# Patient Record
Sex: Female | Born: 1935 | Race: White | Hispanic: No | Marital: Single | Smoking: Never smoker
Health system: Southern US, Community
[De-identification: ages and names within clinical notes are randomized; demographics above are authoritative.]

## PROBLEM LIST (undated history)

## (undated) DIAGNOSIS — Z9989 Dependence on other enabling machines and devices: Secondary | ICD-10-CM

## (undated) DIAGNOSIS — K219 Gastro-esophageal reflux disease without esophagitis: Secondary | ICD-10-CM

## (undated) DIAGNOSIS — M545 Low back pain: Secondary | ICD-10-CM

## (undated) DIAGNOSIS — M1990 Unspecified osteoarthritis, unspecified site: Secondary | ICD-10-CM

## (undated) DIAGNOSIS — D649 Anemia, unspecified: Secondary | ICD-10-CM

## (undated) DIAGNOSIS — I351 Nonrheumatic aortic (valve) insufficiency: Secondary | ICD-10-CM

## (undated) DIAGNOSIS — G8929 Other chronic pain: Secondary | ICD-10-CM

## (undated) DIAGNOSIS — G4733 Obstructive sleep apnea (adult) (pediatric): Secondary | ICD-10-CM

## (undated) DIAGNOSIS — I10 Essential (primary) hypertension: Secondary | ICD-10-CM

## (undated) DIAGNOSIS — F329 Major depressive disorder, single episode, unspecified: Secondary | ICD-10-CM

## (undated) DIAGNOSIS — I639 Cerebral infarction, unspecified: Secondary | ICD-10-CM

## (undated) DIAGNOSIS — F32A Depression, unspecified: Secondary | ICD-10-CM

## (undated) DIAGNOSIS — R3915 Urgency of urination: Secondary | ICD-10-CM

## (undated) DIAGNOSIS — E785 Hyperlipidemia, unspecified: Secondary | ICD-10-CM

## (undated) HISTORY — DX: Nonrheumatic aortic (valve) insufficiency: I35.1

## (undated) HISTORY — DX: Essential (primary) hypertension: I10

## (undated) HISTORY — PX: DOPPLER ECHOCARDIOGRAPHY: SHX263

## (undated) HISTORY — PX: ULTRASOUND GUIDANCE FOR VASCULAR ACCESS: SHX6516

## (undated) HISTORY — PX: JOINT REPLACEMENT: SHX530

## (undated) HISTORY — PX: LAMINOTOMY / EXCISION DISK POSTERIOR CERVICAL SPINE: SUR749

## (undated) HISTORY — PX: CATARACT EXTRACTION, BILATERAL: SHX1313

## (undated) HISTORY — DX: Hyperlipidemia, unspecified: E78.5

## (undated) HISTORY — PX: TUMOR EXCISION: SHX421

## (undated) HISTORY — PX: TONSILLECTOMY: SUR1361

## (undated) HISTORY — PX: BACK SURGERY: SHX140

## (undated) HISTORY — PX: ABDOMINAL HYSTERECTOMY: SHX81

---

## 1998-07-22 ENCOUNTER — Other Ambulatory Visit: Admission: RE | Admit: 1998-07-22 | Discharge: 1998-07-22 | Payer: Self-pay | Admitting: Obstetrics and Gynecology

## 1999-07-28 ENCOUNTER — Other Ambulatory Visit: Admission: RE | Admit: 1999-07-28 | Discharge: 1999-07-28 | Payer: Self-pay | Admitting: Obstetrics and Gynecology

## 2000-08-02 ENCOUNTER — Other Ambulatory Visit: Admission: RE | Admit: 2000-08-02 | Discharge: 2000-08-02 | Payer: Self-pay | Admitting: Obstetrics and Gynecology

## 2000-10-13 ENCOUNTER — Encounter: Payer: Self-pay | Admitting: *Deleted

## 2000-10-13 ENCOUNTER — Ambulatory Visit (HOSPITAL_COMMUNITY): Admission: RE | Admit: 2000-10-13 | Discharge: 2000-10-13 | Payer: Self-pay | Admitting: *Deleted

## 2000-11-23 ENCOUNTER — Encounter: Admission: RE | Admit: 2000-11-23 | Discharge: 2000-11-23 | Payer: Self-pay | Admitting: Internal Medicine

## 2000-11-23 ENCOUNTER — Encounter: Payer: Self-pay | Admitting: Internal Medicine

## 2000-12-29 ENCOUNTER — Encounter: Admission: RE | Admit: 2000-12-29 | Discharge: 2000-12-29 | Payer: Self-pay | Admitting: Neurosurgery

## 2000-12-29 ENCOUNTER — Encounter: Payer: Self-pay | Admitting: Neurosurgery

## 2001-01-13 ENCOUNTER — Encounter: Payer: Self-pay | Admitting: Neurosurgery

## 2001-01-13 ENCOUNTER — Encounter: Admission: RE | Admit: 2001-01-13 | Discharge: 2001-01-13 | Payer: Self-pay | Admitting: Neurosurgery

## 2001-08-24 ENCOUNTER — Encounter: Admission: RE | Admit: 2001-08-24 | Discharge: 2001-08-24 | Payer: Self-pay | Admitting: Neurosurgery

## 2001-08-24 ENCOUNTER — Encounter: Payer: Self-pay | Admitting: Neurosurgery

## 2002-02-21 ENCOUNTER — Encounter: Payer: Self-pay | Admitting: Internal Medicine

## 2002-02-21 ENCOUNTER — Encounter: Admission: RE | Admit: 2002-02-21 | Discharge: 2002-02-21 | Payer: Self-pay | Admitting: Internal Medicine

## 2002-12-25 ENCOUNTER — Ambulatory Visit (HOSPITAL_COMMUNITY): Admission: RE | Admit: 2002-12-25 | Discharge: 2002-12-25 | Payer: Self-pay | Admitting: Family Medicine

## 2004-09-07 ENCOUNTER — Encounter: Admission: RE | Admit: 2004-09-07 | Discharge: 2004-09-07 | Payer: Self-pay | Admitting: Internal Medicine

## 2007-01-06 ENCOUNTER — Encounter: Admission: RE | Admit: 2007-01-06 | Discharge: 2007-01-06 | Payer: Self-pay | Admitting: Internal Medicine

## 2007-01-25 ENCOUNTER — Inpatient Hospital Stay (HOSPITAL_COMMUNITY): Admission: RE | Admit: 2007-01-25 | Discharge: 2007-01-27 | Payer: Self-pay | Admitting: Obstetrics and Gynecology

## 2007-01-25 ENCOUNTER — Encounter (INDEPENDENT_AMBULATORY_CARE_PROVIDER_SITE_OTHER): Payer: Self-pay | Admitting: Obstetrics and Gynecology

## 2007-08-20 ENCOUNTER — Emergency Department (HOSPITAL_COMMUNITY): Admission: EM | Admit: 2007-08-20 | Discharge: 2007-08-20 | Payer: Self-pay | Admitting: Emergency Medicine

## 2007-08-25 ENCOUNTER — Encounter: Admission: RE | Admit: 2007-08-25 | Discharge: 2007-08-25 | Payer: Self-pay | Admitting: Internal Medicine

## 2007-08-31 ENCOUNTER — Encounter: Admission: RE | Admit: 2007-08-31 | Discharge: 2007-08-31 | Payer: Self-pay | Admitting: Family Medicine

## 2007-09-15 ENCOUNTER — Encounter: Admission: RE | Admit: 2007-09-15 | Discharge: 2007-09-15 | Payer: Self-pay | Admitting: Family Medicine

## 2008-03-08 ENCOUNTER — Encounter: Admission: RE | Admit: 2008-03-08 | Discharge: 2008-03-08 | Payer: Self-pay | Admitting: Family Medicine

## 2008-05-31 ENCOUNTER — Encounter: Admission: RE | Admit: 2008-05-31 | Discharge: 2008-05-31 | Payer: Self-pay | Admitting: Family Medicine

## 2008-12-13 ENCOUNTER — Encounter: Admission: RE | Admit: 2008-12-13 | Discharge: 2008-12-13 | Payer: Self-pay | Admitting: Neurosurgery

## 2009-12-05 ENCOUNTER — Encounter: Admission: RE | Admit: 2009-12-05 | Discharge: 2009-12-05 | Payer: Self-pay | Admitting: Neurosurgery

## 2010-05-18 ENCOUNTER — Ambulatory Visit (AMBULATORY_SURGERY_CENTER): Payer: 59 | Admitting: *Deleted

## 2010-05-18 VITALS — Ht 59.0 in | Wt 129.7 lb

## 2010-05-18 DIAGNOSIS — Z1211 Encounter for screening for malignant neoplasm of colon: Secondary | ICD-10-CM

## 2010-05-18 MED ORDER — PEG-KCL-NACL-NASULF-NA ASC-C 100 G PO SOLR: ORAL | Status: DC

## 2010-05-18 NOTE — Progress Notes (Signed)
Pt had colonoscopy with Dr. Virginia Rochester 2002.  Unable to complete colon due to twisted colon.  Dr. Jarold Motto reviewed report.  Okay for colonoscopy with Propofol.    Propofol colon scheduled for 06/15/2010/ Ezra Sites

## 2010-05-19 ENCOUNTER — Encounter: Payer: Self-pay | Admitting: Gastroenterology

## 2010-05-26 NOTE — Op Note (Signed)
NAMEENNIS, DELPOZO                ACCOUNT NO.:  [id number]   MEDICAL RECORD NO.:  [id number]          PATIENT TYPE:  AMB   LOCATION:  SDC                           FACILITY:  [facility]   PHYSICIAN:  Carrington Clamp, M.D. DATE OF BIRTH:  1935-05-17   DATE OF PROCEDURE:  01/25/2007  DATE OF DISCHARGE:                               OPERATIVE REPORT   PREOPERATIVE DIAGNOSES:  1. Right adnexal cyst in a postmenopausal woman.  2. Thickened endometrium.  3. Aright lower quadrant pain.   POSTOPERATIVE DIAGNOSES:  1. Right adnexal cyst in a postmenopausal woman.  2. Thickened endometrium.  3. Aright lower quadrant pain.  4. Right cyst adenomata.   PROCEDURE:  Total abdominal hysterectomy with bilateral salpingo-  oophorectomy.   SURGEON:  Carrington Clamp, M.D.   ASSISTANT:  Miguel Aschoff, M.D.   ANESTHESIA:  General.   FINDINGS:  A very small uterus with a 10-cm right ovary that appeared to  be clear cyst.  There was a very small left ovary.  The bilateral  ureters were identified out of the field of dissection.  The liver edge  was normal.  The kidney were palpated normal.  The appendix appeared  normal.  There was no lymphadenopathy along the aorta.  Right fascial  hernia. Pathology was right ovary for frozen section which showed a  benign cystadenomata.  The uterus, cervix and left salpinx and ovary  sent for permanent.   ESTIMATED BLOOD LOSS:  200 mL.   IV FLUIDS:  1800 mL.   URINE OUTPUT:  375 mL.   COMPLICATIONS:  None.   MEDICATIONS:  None.   TECHNIQUE:  After adequate general anesthesia was achieved, the patient  was prepped and draped in usual sterile fashion in dorsal supine  position.  A Pfannenstiel skin incision was made with a scalpel and  carried down to the fascia with the Bovie cautery.  The fascia was  incised in midline with the scalpel.  At the level of the fascia, it was  noted that there was a thinning of the fascia near the abdominal  obliques on the  right-hand side.  The Mayo scissors were used to extend  the incision in a transverse curvilinear manner, and we deliberately  went a few centimeters lower than usual to go directly through this  hernia so that at the time of repair of the fascia we repaired the  hernia as well.  The fascia was dissected from the rectus muscles  superior and inferior with the Mayo scissors.  The rectus muscles were  split in the midline and the bowel free portion of peritoneum entered  into bluntly.  The peritoneum was then incised in a superior and  inferior manner with the good visualization of bowel and bladder.   The O'Connor-O'Sullivan retractor was placed, and the bowel was packed  away with wet laps.  The right ovary was identified and brought up  through the field of dissection.  Pelvic washings had been done just  prior to this and sent for cytology.  The right infundibulopelvic  ligament was clamped close to the  ovary with a Heaney clamp and then  incised with the Mayo scissors.  A stitch of 0 Vicryl was placed around  this followed by a freehand tie of 0 Vicryl.  The uterine ovarian  ligament and the rest of the mesosalpinx was then incorporated in  another bite with the Heaney clamp.  The ovary was then amputated with  the Metzenbaum scissors and sent to pathology for frozen section.  This  pedicle was secured with a stitch of 0 Vicryl.  At this point, attention  was then turned to the right round ligament.  The ligament was secured  with a suture ligature of 0 Vicryl and then divided with the Bovie  cautery.  The vesicouterine fascia was then incised with Bovie cautery  as well.  On the left-hand side, the round ligament was secured with  suture ligature and then incised with Bovie cautery.  The vesicouterine  fascia was retracted away from the cervix with both sharp dissection  with the Bovie cautery and sharp dissection with the Metzenbaum's.  The  posterior leaf of broad ligament was  entered into with the Bovie  cautery.  A clamp was placed over the infundibulopelvic ligament, and  this was incised and then secured with a freehand tie of 0 Vicryl  followed by stitch of 0 Vicryl.  An additional freehand tie was placed  after this to ensure hemostasis.  The ureters were identified well out  of the field of dissection.   The uterine arteries were skeletonized and grasped bilaterally with a  pair of Heaney clamps at the level of the internal os.  Each pedicle was  incised with the Mayo scissors and then secured with a stitch of 0  Vicryl.  A straight Heaney was placed on both sides, and each pedicle  was incised with a scalpel and then secured with a stitch of 0 Vicryl.  At the level of the reflection of the vagina onto the cervix, a pair of  curved Heaney's were replaced along the cervix, and each pedicle was  incised with the Mayo scissors and secured with a Heaney stitch of 0  Vicryl.  The uterine specimen was then amputated with Mayo scissors and  the vagina closed with two figure-of-eight stitches of 0 Vicryl.  Irrigation was performed, and the ureters were again identified  peristalsing out of the field of dissection.   The pelvis was irrigated and all instruments withdrawn from the abdomen.  Peritoneum was closed with a running stitch of 2-0 Vicryl.  All  instruments were withdrawn once hemostasis had been achieved.  The  peritoneum was closed with running stitch of 2-0 Vicryl.  This  incorporated the rectus muscles in a separate layer.  The fascia was  closed with a running stitch of 0 PDS which incorporated the prior  fascial defect, thus essentially closing the hernia on the right-hand  side.  The peritoneum was then closed with interrupted stitches of 2-0  plain gut.  The subcutaneous tissue had been rendered hemostatic with  Bovie cautery and irrigation.  The skin was closed with staples.  The  patient tolerated the procedure well and was turned to recovery  in  stable condition.      Carrington Clamp, M.D.  Electronically Signed     MH/MEDQ  D:  01/25/2007  T:  01/25/2007  Job:  [id number]

## 2010-05-29 NOTE — Procedures (Signed)
[facility]  Patient:    Julia Mcconnell, Julia Mcconnell Visit Number: [id number] MRN: [id number]          Service Type: END Location: ENDO Attending Physician:  Sabino Gasser Proc. Date: 10/13/00 Admit Date:  10/13/2000                             Procedure Report  PROCEDURE:  Colonoscopy.  INDICATIONS:  Rectal bleeding.  ANESTHESIA:  Demerol 60, Versed 4 mg.  DESCRIPTION OF PROCEDURE:  With the patient mildly sedated in the left lateral decubitus position and subsequently rolled to her back, the Olympus videoscopic colonoscope was inserted into the rectum and passed under direct vision to an area where the colon was twisted.  The endoscope would not get past this, so it was withdrawn, and the pediatric colonoscope was inserted to his distance.  It, too, could not get through.  Therefore, it was withdrawn. It appeared to be proximal to the splenic flexure.  The endoscope was then withdrawn, taking circumferential views of the remaining colonic mucosa, stopping the rectum to take a retroflex view of the anal canal.  The endoscope was straightened and withdrawn.  The patients vital signs and pulse oximeter remained stable.  The patient tolerated the procedure well without apparent complications.  FINDINGS:  Extrinsic twisted colon.  Will plan on getting barium enema to evaluate further. Attending Physician:  Sabino Gasser DD:  10/13/00 TD:  10/13/00 Job: [id number]

## 2010-05-29 NOTE — Discharge Summary (Signed)
NAMEELMIRA, Julia Mcconnell                ACCOUNT NO.:  [id number]   MEDICAL RECORD NO.:  [id number]          PATIENT TYPE:  INP   LOCATION:  9320                          FACILITY:  [facility]   PHYSICIAN:  Carrington Clamp, M.D. DATE OF BIRTH:  12-28-1935   DATE OF ADMISSION:  01/25/2007  DATE OF DISCHARGE:  01/27/2007                               DISCHARGE SUMMARY   ADMISSION DIAGNOSIS:  An 8-cm simple cyst without increased blood flow  in a 75 year old postmenopausal women.   DISCHARGE DIAGNOSIS:  An 8-cm simple cyst without increased blood flow  in a 75 year old postmenopausal women.   PERTINENT PROCEDURE PERFORMED:  Total abdominal hysterectomy with  bilateral salpingo-oophorectomy   PERTINENT TEST RESULTS:  Postoperative H&H of 11.8 and 34.9.   ADMISSION HISTORY AND PHYSICAL:  Please refer to the written history and  physical on chart.  Briefly, this is a 75 year old, G 0, postmenopausal  complaining of right lower quadrant pain in September and found to have  a possible palpable mass.  The patient was found to have a 7.9 cm simple  cyst on the right ovary without increased blood flow.  CA-125 was 13.6  and CA antigen was 1.5, both were normal.  CT confirmed no other  abnormalities.  After all risks, benefits, and alternatives had been  discussed with the patient, the patient elected to proceed with a simple  total abdominal hysterectomy with bilateral salpingo-oophorectomy.  The  endometrial lining had been found to be slightly thickened and cystic as  well on ultrasound.   HOSPITAL STAY:  The patient underwent the above named procedures without  complications.  On postoperative day #2, the patient was eating,  ambulating, and voiding without complication.  She did have some  bruising around her incision that was explained probably secondary to  the retractors.  The patient was discharged with the following.   DIET:  Increased water and fiber.   ACTIVITY:  Walk with assistance.   No driving for 2 weeks.  No lifting  for 6 weeks.  No sexual activity for 6 weeks.  May shower but no bath.   MEDICATIONS:  1. Percocet 5 mg 1 p.o. q.4-6 hours as needed for pain.  2. Colace 100 mg 1 p.o. b.i.d.   The patient is to continue her other current medications as per usual.  Return was Dr. Henderson Cloud on Monday or Tuesday for staple removal.      Carrington Clamp, M.D.  Electronically Signed     MH/MEDQ  D:  03/29/2007  T:  03/30/2007  Job:  [id number]

## 2010-06-01 ENCOUNTER — Other Ambulatory Visit: Payer: Self-pay | Admitting: Gastroenterology

## 2010-06-15 ENCOUNTER — Ambulatory Visit (AMBULATORY_SURGERY_CENTER): Payer: 59 | Admitting: Gastroenterology

## 2010-06-15 VITALS — BP 158/74 | HR 75 | Temp 97.2°F | Resp 18 | Ht 59.0 in | Wt 129.0 lb

## 2010-06-15 DIAGNOSIS — Z1211 Encounter for screening for malignant neoplasm of colon: Secondary | ICD-10-CM

## 2010-06-15 MED ORDER — SODIUM CHLORIDE 0.9 % IV SOLN: 500.0000 mL | INTRAVENOUS | Status: DC

## 2010-06-15 NOTE — Patient Instructions (Signed)
Your colonoscopy was normal.  You will need another colonoscopy in 10 years.  We will send you a reminder.   If you have any questions, please call [phone]. Thank-you.

## 2010-06-16 ENCOUNTER — Telehealth: Payer: Self-pay | Admitting: *Deleted

## 2010-06-16 NOTE — Telephone Encounter (Signed)

## 2010-10-01 LAB — CBC
Hemoglobin: 13.8
Hemoglobin: 11.8 — ABNORMAL LOW
MCHC: 34.2
MCV: 94.5
MCV: 94.8
Platelets: 192
RBC: 3.54 — ABNORMAL LOW
RDW: 14.3
WBC: 3.8 — ABNORMAL LOW
WBC: 6.2

## 2010-10-01 LAB — COMPREHENSIVE METABOLIC PANEL
ALT: 14
AST: 25
Albumin: 2.9 — ABNORMAL LOW
Alkaline Phosphatase: 48
BUN: 4 — ABNORMAL LOW
Chloride: 99
Creatinine, Ser: 0.63
GFR calc Af Amer: >60
GFR calc non Af Amer: >60
Glucose, Bld: 117 — ABNORMAL HIGH
Total Protein: 5.9 — ABNORMAL LOW

## 2010-10-01 LAB — BASIC METABOLIC PANEL
Creatinine, Ser: 0.73
Potassium: 3.9
Sodium: 136

## 2010-10-14 ENCOUNTER — Other Ambulatory Visit: Payer: Self-pay | Admitting: Family Medicine

## 2010-10-14 DIAGNOSIS — M25561 Pain in right knee: Secondary | ICD-10-CM

## 2010-10-16 ENCOUNTER — Ambulatory Visit
Admission: RE | Admit: 2010-10-16 | Discharge: 2010-10-16 | Disposition: A | Discharge status: Home / Self Care | Payer: 59 | Source: Ambulatory Visit | Attending: Family Medicine | Admitting: Family Medicine

## 2010-10-16 DIAGNOSIS — M25561 Pain in right knee: Secondary | ICD-10-CM

## 2011-09-28 ENCOUNTER — Other Ambulatory Visit: Payer: Self-pay | Admitting: Family Medicine

## 2011-09-28 DIAGNOSIS — M545 Low back pain: Secondary | ICD-10-CM

## 2011-10-01 ENCOUNTER — Ambulatory Visit
Admission: RE | Admit: 2011-10-01 | Discharge: 2011-10-01 | Disposition: A | Discharge status: Home / Self Care | Payer: 59 | Source: Ambulatory Visit | Attending: Family Medicine | Admitting: Family Medicine

## 2011-10-01 DIAGNOSIS — M545 Low back pain: Secondary | ICD-10-CM

## 2011-11-25 ENCOUNTER — Other Ambulatory Visit (HOSPITAL_COMMUNITY): Payer: Self-pay | Admitting: Cardiovascular Disease

## 2011-11-25 DIAGNOSIS — R0989 Other specified symptoms and signs involving the circulatory and respiratory systems: Secondary | ICD-10-CM

## 2011-12-03 ENCOUNTER — Ambulatory Visit (HOSPITAL_COMMUNITY)
Admission: RE | Admit: 2011-12-03 | Discharge: 2011-12-03 | Disposition: A | Discharge status: Home / Self Care | Payer: 59 | Source: Ambulatory Visit | Attending: Cardiovascular Disease | Admitting: Cardiovascular Disease

## 2011-12-03 DIAGNOSIS — R0989 Other specified symptoms and signs involving the circulatory and respiratory systems: Secondary | ICD-10-CM | POA: Insufficient documentation

## 2011-12-03 NOTE — Progress Notes (Signed)
Carotid Duplex Sonogram Completed. Lee-Ann Gal K 12/03/2011  

## 2012-04-03 ENCOUNTER — Other Ambulatory Visit: Payer: Self-pay

## 2012-04-03 ENCOUNTER — Other Ambulatory Visit: Payer: Self-pay | Admitting: Internal Medicine

## 2012-04-03 ENCOUNTER — Emergency Department (HOSPITAL_COMMUNITY)
Admission: EM | Admit: 2012-04-03 | Discharge: 2012-04-03 | Disposition: A | Discharge status: Home / Self Care | Payer: 59 | Attending: Emergency Medicine | Admitting: Emergency Medicine

## 2012-04-03 ENCOUNTER — Encounter (HOSPITAL_COMMUNITY): Payer: Self-pay | Admitting: Emergency Medicine

## 2012-04-03 ENCOUNTER — Emergency Department (HOSPITAL_COMMUNITY): Payer: 59

## 2012-04-03 DIAGNOSIS — M810 Age-related osteoporosis without current pathological fracture: Secondary | ICD-10-CM | POA: Insufficient documentation

## 2012-04-03 DIAGNOSIS — I639 Cerebral infarction, unspecified: Secondary | ICD-10-CM

## 2012-04-03 DIAGNOSIS — Z79899 Other long term (current) drug therapy: Secondary | ICD-10-CM | POA: Insufficient documentation

## 2012-04-03 DIAGNOSIS — R29810 Facial weakness: Secondary | ICD-10-CM

## 2012-04-03 DIAGNOSIS — I10 Essential (primary) hypertension: Secondary | ICD-10-CM | POA: Insufficient documentation

## 2012-04-03 DIAGNOSIS — Z9849 Cataract extraction status, unspecified eye: Secondary | ICD-10-CM | POA: Insufficient documentation

## 2012-04-03 DIAGNOSIS — R29818 Other symptoms and signs involving the nervous system: Secondary | ICD-10-CM | POA: Insufficient documentation

## 2012-04-03 LAB — POCT I-STAT, CHEM 8
BUN: 15 mg/dL (ref 6–23)
Chloride: 103 mEq/L (ref 96–112)
Creatinine, Ser: 1 mg/dL (ref 0.50–1.10)
Glucose, Bld: 98 mg/dL (ref 70–99)
Hemoglobin: 13.9 g/dL (ref 12.0–15.0)
Potassium: 3.4 mEq/L — ABNORMAL LOW (ref 3.5–5.1)
Sodium: 140 mEq/L (ref 135–145)

## 2012-04-03 LAB — CBC WITH DIFFERENTIAL/PLATELET
HCT: 39.8 % (ref 36.0–46.0)
MCHC: 34.9 g/dL (ref 30.0–36.0)
MCV: 93 fL (ref 78.0–100.0)
Monocytes Relative: 7 % (ref 3–12)
RDW: 13.5 % (ref 11.5–15.5)

## 2012-04-03 MED ORDER — CLOPIDOGREL BISULFATE 75 MG PO TABS
75.0000 mg | ORAL_TABLET | Freq: Once | ORAL | Status: AC
  Administered 2012-04-03: 75 mg via ORAL
  Filled 2012-04-03: qty 1

## 2012-04-03 MED ORDER — CLOPIDOGREL BISULFATE 75 MG PO TABS: 75.0000 mg | ORAL_TABLET | Freq: Every day | ORAL | Status: AC

## 2012-04-03 NOTE — ED Notes (Signed)
Pt states she woke up this am and her rt side of her face was swollen and tingling and her lip is drooping . Pt states  Her face was like this when she woke and it hasn't gone away no no other  Issues like  or slurred speech

## 2012-04-03 NOTE — ED Provider Notes (Signed)
History     CSN: [id number]  Arrival date & time 04/03/12  1237   First MD Initiated Contact with Patient 04/03/12 1350      No chief complaint on file.    HPI  The patient presents with right facial droop.  She awoke approximately 7 hours prior to my evaluation, noticed that the right lower portion of her face was more puffy and drooping. No pain, no visual changes, neck pain, no dyspnea, no dysphagia, no other focal complaints. She was in her usual state of lunch went to bed last night. She saw her primary care provider's assistant, was sent here for further evaluation.  Past Medical History  Diagnosis Date  . Hypertension   . Osteoporosis     Past Surgical History  Procedure Laterality Date  . Cataract extraction, bilateral    . Diskectomy neck    . Tumor right shoulder    . Abdominal hysterectomy      No family history on file.  History  Substance Use Topics  . Smoking status: Never Smoker   . Smokeless tobacco: Not on file  . Alcohol Use: No    OB History   Grav Para Term Preterm Abortions TAB SAB Ect Mult Living                  Review of Systems  Constitutional:       Per HPI, otherwise negative  HENT:       Per HPI, otherwise negative  Respiratory:       Per HPI, otherwise negative  Cardiovascular:       Per HPI, otherwise negative  Gastrointestinal: Negative for vomiting.  Endocrine:       Negative aside from HPI  Genitourinary:       Neg aside from HPI   Musculoskeletal:       Per HPI, otherwise negative  Skin: Negative.   Neurological: Negative for syncope.    Allergies  Codeine  Home Medications   Current Outpatient Rx  Name  Route  Sig  Dispense  Refill  . acetaminophen (TYLENOL) 500 MG tablet   Oral   Take 500 mg by mouth every 6 (six) hours as needed for pain.         Marland Kitchen alendronate (FOSAMAX) 70 MG tablet   Oral   Take 70 mg by mouth every 7 (seven) days. Sunday Take with a full glass of water on an empty stomach.        Marland Kitchen amLODipine (NORVASC) 5 MG tablet   Oral   Take 5 mg by mouth daily.           . calcium carbonate (OS-CAL) 600 MG TABS   Oral   Take 600 mg by mouth daily.         . carvedilol (COREG) 12.5 MG tablet   Oral   Take 6.25 mg by mouth 2 (two) times daily with a meal. Lunch and in the evening.         . cholecalciferol (VITAMIN D) 400 UNITS TABS   Oral   Take 400 Units by mouth.           Marland Kitchen lisinopril-hydrochlorothiazide (PRINZIDE,ZESTORETIC) 20-12.5 MG per tablet   Oral   Take 1 tablet by mouth daily.           . Multiple Vitamin (MULTIVITAMIN) tablet   Oral   Take 1 tablet by mouth daily.           . clopidogrel (  PLAVIX) 75 MG tablet   Oral   Take 1 tablet (75 mg total) by mouth daily.   15 tablet   0     BP 143/73  Pulse 94  Temp(Src) 98.4 F (36.9 C) (Oral)  Resp 14  Ht 4\' 11"  (1.499 m)  Wt 126 lb (57.153 kg)  BMI 25.44 kg/m2  SpO2 92%  Physical Exam  Nursing note and vitals reviewed. Constitutional: She is oriented to person, place, and time. She appears well-developed and well-nourished. No distress.  HENT:  Head: Normocephalic and atraumatic.  Eyes: Conjunctivae and EOM are normal.  Cardiovascular: Normal rate and regular rhythm.   Pulmonary/Chest: Effort normal and breath sounds normal. No stridor. No respiratory distress.  Abdominal: She exhibits no distension.  Musculoskeletal: She exhibits no edema.  Neurological: She is alert and oriented to person, place, and time. She has normal strength. A cranial nerve deficit is present. No sensory deficit. Coordination and gait normal.  Patient's R lower face is drooping. No smiling asym. Both eyelids elevate appropriately.   Skin: Skin is warm and dry.  Psychiatric: She has a normal mood and affect.    ED Course  Procedures (including critical care time)  Labs Reviewed  POCT I-STAT, CHEM 8 - Abnormal; Notable for the following:    Potassium 3.4 (*)    All other components within  normal limits  CBC WITH DIFFERENTIAL   Ct Head (brain) Wo Contrast  04/03/2012  *RADIOLOGY REPORT*  Clinical Data: Right side face and tingling and parasthesias. Facial droop.  CT HEAD WITHOUT CONTRAST  Technique:  Contiguous axial images were obtained from the base of the skull through the vertex without contrast.  Comparison: None.  Findings: There is no evidence for acute hemorrhage, hydrocephalus, mass lesion, or abnormal extra-axial fluid collection.  No definite CT evidence for acute infarction.  The visualized paranasal sinuses and mastoid air cells are clear.  IMPRESSION: No acute intracranial findings.   Original Report Authenticated By: Kennith Center, M.D.      1. Facial droop    Cardiac 80 sinus rhythm normal Pulse ox 100% room air normal    Date: 04/03/2012  Rate: 82  Rhythm: normal sinus rhythm  QRS Axis: left  Intervals: normal  ST/T Wave abnormalities: normal  Conduction Disutrbances:none  Narrative Interpretation:   Old EKG Reviewed: none available BORDERLINE   3:49 PM I discussed the patient's case with her primary care physician.  Given the patient's insistence on going home, we discussed if your treatment for stroke, with outpatient MRI, to which the patient is amenable.  After a lengthy conversation the patient refuses admission for further evaluation and management of possible stroke.  MDM  The patient presents with right facial droop.  Notably, the patient was in her usual state of health prior to the onset of symptoms, but has visible facial droop in the right lower half of her face.  Absent other neurologic complaints, palsy is a consideration, as is stroke, which was discussed at length with the patient.  After a lengthy discussion about the pros and cons, risks and benefits the patient refused admission for expeditious MRI, but was amenable to empiric treatment with Plavix, outpatient MRI, PMD followup.  This was arranged, after discussion with the patient's  primary care physician, and the patient was discharged in stable condition to     Gerhard Munch, MD 04/03/12 1551

## 2012-04-03 NOTE — ED Notes (Signed)
Pt woke up about 630 or 645 and went to see dr Renne Crigler before coming here

## 2012-04-04 ENCOUNTER — Other Ambulatory Visit: Payer: Self-pay | Admitting: Internal Medicine

## 2012-04-04 DIAGNOSIS — R200 Anesthesia of skin: Secondary | ICD-10-CM

## 2012-04-06 ENCOUNTER — Ambulatory Visit
Admission: RE | Admit: 2012-04-06 | Discharge: 2012-04-06 | Disposition: A | Discharge status: Home / Self Care | Payer: 59 | Source: Ambulatory Visit | Attending: Internal Medicine | Admitting: Internal Medicine

## 2012-04-06 DIAGNOSIS — I639 Cerebral infarction, unspecified: Secondary | ICD-10-CM

## 2012-04-06 DIAGNOSIS — R200 Anesthesia of skin: Secondary | ICD-10-CM

## 2012-04-13 ENCOUNTER — Encounter: Payer: Self-pay | Admitting: Neurology

## 2012-04-13 ENCOUNTER — Ambulatory Visit (INDEPENDENT_AMBULATORY_CARE_PROVIDER_SITE_OTHER): Payer: 59 | Admitting: Neurology

## 2012-04-13 VITALS — BP 148/75 | HR 81 | Ht <= 58 in | Wt 128.0 lb

## 2012-04-13 DIAGNOSIS — I10 Essential (primary) hypertension: Secondary | ICD-10-CM

## 2012-04-13 DIAGNOSIS — I872 Venous insufficiency (chronic) (peripheral): Secondary | ICD-10-CM | POA: Insufficient documentation

## 2012-04-13 DIAGNOSIS — I6350 Cerebral infarction due to unspecified occlusion or stenosis of unspecified cerebral artery: Secondary | ICD-10-CM

## 2012-04-13 NOTE — Progress Notes (Signed)
Julia Mcconnell is a 77 years old right-handed Caucasian female, referred by her primary care physician Dr. Virgina Norfolk, for evaluation of acute onset right facial numbness She has past medical history of hypertension, otherwise healthy, still working full-time at [employer], she lives with her 57 years old mother, who is also independent  In  March 24th, 2014, when she wakes up from sleep, she noticed right facial numbness, as if it was swollen, just came back from her dentist, she also has slight difficulty talking, symptoms last less than one day,she denied right side weakness, she denies right arm and neck numbness   she presented to her primary care physician, was referred to CAT scan of the brain at emergency room, showed no acute findings,   She later had MRI scan and [facility] imaging April 06 2012, which showed mild small vessel disease, no acute disease, Ultrasound of carotid arteries showed less than 50% stenosis at bilateral internal carotid artery  She was started on Plavix 75 mg every day, fasting lipid profile panel was pending,.  Her cardiologist Dr. Sherry Ruffing, "for leaking valve", she could not recall echocardiogram recently    Labs showed normal CBC, CMP,     Review of Systems  Out of a complete 14 system review, the patient complains of only the following symptoms, and all other reviewed systems are negative.   Constitutional:   N/A Cardiovascular:  Bilateral leg swelling. Ear/Nose/Throat:  N/A Skin: N/A Eyes: N/A Respiratory: N/A Gastroitestinal: N/A    Hematology/Lymphatic:  N/A Endocrine:  N/A Musculoskeletal:N/A Allergy/Immunology: N/A Neurological: N/A Psychiatric:    N/A  Assessment and plan: 77 year old female, with a vascular risk fact of aging, hypertension, presenting with transient right facial numbness, slight talking difficulty, suggestive of a left subcortical, vs. internal capsule small vessel disease. 1 continue Plavix. 2. Moderate  exercise, optimize blood pressure control less than 130 over 80 3 echocardiogram I will call her report 4 return to clinic as needed.

## 2012-09-01 ENCOUNTER — Telehealth (HOSPITAL_COMMUNITY): Payer: Self-pay | Admitting: Cardiovascular Disease

## 2012-09-06 ENCOUNTER — Other Ambulatory Visit (HOSPITAL_COMMUNITY): Payer: Self-pay | Admitting: Cardiovascular Disease

## 2012-09-06 DIAGNOSIS — I10 Essential (primary) hypertension: Secondary | ICD-10-CM

## 2012-09-18 ENCOUNTER — Ambulatory Visit (HOSPITAL_COMMUNITY)
Admission: RE | Admit: 2012-09-18 | Discharge: 2012-09-18 | Disposition: A | Discharge status: Home / Self Care | Payer: 59 | Source: Ambulatory Visit | Attending: Cardiology | Admitting: Cardiology

## 2012-09-18 DIAGNOSIS — I10 Essential (primary) hypertension: Secondary | ICD-10-CM

## 2012-09-18 NOTE — Progress Notes (Signed)
2D Echo Performed 09/18/2012    Sid Greener, RCS  

## 2012-09-19 ENCOUNTER — Telehealth: Payer: Self-pay | Admitting: *Deleted

## 2012-09-19 ENCOUNTER — Encounter: Payer: Self-pay | Admitting: *Deleted

## 2012-09-19 DIAGNOSIS — I351 Nonrheumatic aortic (valve) insufficiency: Secondary | ICD-10-CM

## 2012-09-19 NOTE — Telephone Encounter (Signed)
Message copied by Marella Bile on Tue Sep 19, 2012 10:23 PM ------      Message from: Runell Gess      Created: Tue Sep 19, 2012  4:50 PM       No change from prior study. Repeat in 12 months. ------

## 2012-09-19 NOTE — Telephone Encounter (Signed)
Order placed for repeat echo in 1 year 

## 2012-10-21 ENCOUNTER — Other Ambulatory Visit (HOSPITAL_COMMUNITY): Payer: Self-pay | Admitting: Cardiovascular Disease

## 2012-10-23 ENCOUNTER — Other Ambulatory Visit (HOSPITAL_COMMUNITY): Payer: Self-pay | Admitting: Cardiovascular Disease

## 2012-10-23 NOTE — Telephone Encounter (Signed)
Rx was sent to pharmacy electronically. 

## 2012-12-11 ENCOUNTER — Ambulatory Visit (INDEPENDENT_AMBULATORY_CARE_PROVIDER_SITE_OTHER): Payer: 59 | Admitting: Cardiology

## 2012-12-11 ENCOUNTER — Encounter: Payer: Self-pay | Admitting: Cardiology

## 2012-12-11 VITALS — BP 110/60 | HR 63 | Ht 59.0 in | Wt 128.0 lb

## 2012-12-11 DIAGNOSIS — E785 Hyperlipidemia, unspecified: Secondary | ICD-10-CM | POA: Insufficient documentation

## 2012-12-11 DIAGNOSIS — I10 Essential (primary) hypertension: Secondary | ICD-10-CM

## 2012-12-11 DIAGNOSIS — I351 Nonrheumatic aortic (valve) insufficiency: Secondary | ICD-10-CM

## 2012-12-11 DIAGNOSIS — I739 Peripheral vascular disease, unspecified: Secondary | ICD-10-CM

## 2012-12-11 DIAGNOSIS — I359 Nonrheumatic aortic valve disorder, unspecified: Secondary | ICD-10-CM

## 2012-12-11 DIAGNOSIS — G459 Transient cerebral ischemic attack, unspecified: Secondary | ICD-10-CM

## 2012-12-11 NOTE — Progress Notes (Signed)
12/11/2012 Julia Mcconnell   04/03/1935  [id number]  Primary Physicia Londell Moh, MD Primary Cardiologist: Dr Allyson Sabal  HPI:  Pleasant 77 y/o followed by Dr Allyson Sabal and Dr Renne Crigler with aortic regurgitation. She is asymptomatic from this at this time. Her last echo was Sept 2014. She did have a TIA in March 2014 and is on Plavix. Carotid dopplers showed < 50% ICA stenosis and MRI showed mild chronic microvascular disease. 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 She denies any unusual DOE or palpitations.  .    Current Outpatient Prescriptions  Medication Sig Dispense Refill  . alendronate (FOSAMAX) 70 MG tablet Take 70 mg by mouth every 7 (seven) days. Sunday Take with a full glass of water on an empty stomach.      Marland Kitchen amLODipine (NORVASC) 5 MG tablet Take 5 mg by mouth daily.       Marland Kitchen atorvastatin (LIPITOR) 20 MG tablet Take 20 mg by mouth daily.      . calcium carbonate (OS-CAL) 600 MG TABS Take 600 mg by mouth 2 (two) times daily.       . carvedilol (COREG) 12.5 MG tablet Take 0.5 tablets (6.25 mg total) by mouth 2 (two) times daily with a meal.  30 tablet  7  . cholecalciferol (VITAMIN D) 400 UNITS TABS Take 400 Units by mouth.        . clopidogrel (PLAVIX) 75 MG tablet Take 1 tablet (75 mg total) by mouth daily.  15 tablet  0  . lisinopril-hydrochlorothiazide (PRINZIDE,ZESTORETIC) 20-12.5 MG per tablet Take 1 tablet by mouth daily.        . Multiple Vitamin (MULTIVITAMIN) tablet Take 1 tablet by mouth daily.         No current facility-administered medications for this visit.    Allergies  Allergen Reactions  . Codeine Other (See Comments)    Makes me really hyper."    History   Social History  . Marital Status: Single    Spouse Name: N/A    Number of Children: o  . Years of Education: 12   Occupational History  .  [employer]   Social History Main Topics  . Smoking status:  Never Smoker   . Smokeless tobacco: Not on file  . Alcohol Use: No  . Drug Use: No  . Sexual Activity: Not on file   Other Topics Concern  . 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     Review of Systems: General: negative for chills, fever, night sweats or weight changes.  Cardiovascular: negative for chest pain, dyspnea on exertion, edema, orthopnea, palpitations, paroxysmal nocturnal dyspnea or shortness of breath Dermatological: negative for rash Respiratory: negative for cough or wheezing Urologic: negative for hematuria Abdominal: negative for nausea, vomiting, diarrhea, bright red blood per rectum, melena, or hematemesis Neurologic: negative for visual changes, syncope, or dizziness She has had problems with her Rt foot that sounds like plantar fascitis.  All other systems reviewed and are otherwise negative except as noted above.    Blood pressure 110/60, pulse 63, height 4\' 11"  (1.499 m), weight 128 lb (58.06 kg).  General appearance: alert, cooperative and no distress Neck: no carotid bruit and no JVD Lungs: clear to auscultation bilaterally Heart: regular rate and rhythm, soft systolic murmur, I could not appreciate any AR on exam.  EKG NSR, poor ant RW  ASSESSMENT AND PLAN:   Moderate aortic regurgitation Currently asymptomatic. Echo Sept 2014 shows moderate AR  Hypertension Controlled  Dyslipidemia Dr Renne Crigler follows  PVD (peripheral vascular disease)- < 50% bilat ICA 3/14 On Plavix  TIA (transient ischemic attack)- March 2014 Seen by Dr Jeannie Fend. [place] neurologist. Plan to continue Plavix.   PLAN  She will continue Plavix. She will follow up with Dr Allyson Sabal in 6 months. She knows to call us if she has any unusual DOE or palpitations.  [facility] KPA-C 12/11/2012 9:33 AM

## 2012-12-11 NOTE — Assessment & Plan Note (Signed)
On Plavix 

## 2012-12-11 NOTE — Assessment & Plan Note (Signed)
Currently asymptomatic. Echo Sept 2014 shows moderate AR

## 2012-12-11 NOTE — Patient Instructions (Signed)
Your physician recommends that you schedule a follow-up appointment in: 6 months with Dr Allyson Sabal Try rolling you foot over a frozen water bottle for your plantar fascitis.

## 2012-12-11 NOTE — Assessment & Plan Note (Signed)
Seen by Dr Jeannie Fend. [place] neurologist. Plan to continue Plavix.

## 2012-12-11 NOTE — Assessment & Plan Note (Signed)
Controlled.  

## 2012-12-11 NOTE — Assessment & Plan Note (Signed)
Dr Pharr follows 

## 2013-04-11 ENCOUNTER — Other Ambulatory Visit: Payer: Self-pay | Admitting: Obstetrics and Gynecology

## 2013-04-11 DIAGNOSIS — M8580 Other specified disorders of bone density and structure, unspecified site: Secondary | ICD-10-CM

## 2013-04-16 ENCOUNTER — Ambulatory Visit
Admission: RE | Admit: 2013-04-16 | Discharge: 2013-04-16 | Disposition: A | Discharge status: Home / Self Care | Payer: 59 | Source: Ambulatory Visit | Attending: Obstetrics and Gynecology | Admitting: Obstetrics and Gynecology

## 2013-04-16 ENCOUNTER — Encounter: Payer: Self-pay | Admitting: Gastroenterology

## 2013-04-16 DIAGNOSIS — M8580 Other specified disorders of bone density and structure, unspecified site: Secondary | ICD-10-CM

## 2013-05-28 ENCOUNTER — Ambulatory Visit (INDEPENDENT_AMBULATORY_CARE_PROVIDER_SITE_OTHER): Payer: 59 | Admitting: Cardiovascular Disease

## 2013-05-28 ENCOUNTER — Encounter: Payer: Self-pay | Admitting: Cardiovascular Disease

## 2013-05-28 VITALS — BP 138/64 | HR 79 | Ht <= 58 in | Wt 129.0 lb

## 2013-05-28 DIAGNOSIS — E785 Hyperlipidemia, unspecified: Secondary | ICD-10-CM

## 2013-05-28 DIAGNOSIS — I10 Essential (primary) hypertension: Secondary | ICD-10-CM

## 2013-05-28 DIAGNOSIS — I359 Nonrheumatic aortic valve disorder, unspecified: Secondary | ICD-10-CM

## 2013-05-28 DIAGNOSIS — I351 Nonrheumatic aortic (valve) insufficiency: Secondary | ICD-10-CM

## 2013-05-28 NOTE — Assessment & Plan Note (Signed)
Controlled on current medications 

## 2013-05-28 NOTE — Assessment & Plan Note (Signed)
We've been following her moderate aortic insufficiency by 2-D echocardiography her annual basis. This was last performed in September which showed normal LV systolic function, normal LV cavity size with moderate aortic insufficiency. She is completely asymptomatic. We'll continue to follow this on an annual basis.

## 2013-05-28 NOTE — Progress Notes (Signed)
05/28/2013 Julia Mcconnell   February 02, 1935  [id number]  Primary Physician Julia Pel, MD Primary Cardiologist: Julia Harp MD Julia Mcconnell   HPI:  The patient is a very pleasant 78 year old thin-appearing single Caucasian female with no children whom I last saw in the office 6 months ago. 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 Her problems include hypertension, hyperlipidemia, and moderate to severe aortic insufficiency which she is asymptomatic from. LV size and function have remained normal by 2D echo. Julia Mcconnell follows her laboratory exams including lipid profile.   Since I saw her last she has been without complaints except for an episode of what sounds like a TIA, which she was worked up for and which resolved spontaneously. Carotid Dopplers performed in our office December 03, 2011, were essentially normal. She saw Julia Mcconnell [facility] in the office back in December at that time she has done well and is currently stable    Current Outpatient Prescriptions  Medication Sig Dispense Refill  . alendronate (FOSAMAX) 70 MG tablet Take 70 mg by mouth every 7 (seven) days. Sunday Take with a full glass of water on an empty stomach.      Marland Kitchen amLODipine (NORVASC) 5 MG tablet Take 5 mg by mouth daily.       Marland Kitchen atorvastatin (LIPITOR) 20 MG tablet Take 20 mg by mouth daily.      . calcium carbonate (OS-CAL) 600 MG TABS Take 600 mg by mouth 2 (two) times daily.       . carvedilol (COREG) 12.5 MG tablet Take 0.5 tablets (6.25 mg total) by mouth 2 (two) times daily with a meal.  30 tablet  7  . cholecalciferol (VITAMIN D) 400 UNITS TABS Take 400 Units by mouth.        . clopidogrel (PLAVIX) 75 MG tablet Take 1 tablet (75 mg total) by mouth daily.  15 tablet  0  . lisinopril-hydrochlorothiazide (PRINZIDE,ZESTORETIC) 20-12.5 MG per tablet Take 1 tablet by mouth daily.        . Multiple Vitamin (MULTIVITAMIN) tablet Take 1 tablet by  mouth daily.         No current facility-administered medications for this visit.    Allergies  Allergen Reactions  . Codeine Other (See Comments)    Makes me really hyper."    History   Social History  . Marital Status: Single    Spouse Name: N/A    Number of Children: o  . Years of Education: 12   Occupational History  .  [employer]   Social History Main Topics  . Smoking status: Never Smoker   . Smokeless tobacco: Not on file  . Alcohol Use: No  . Drug Use: No  . Sexual Activity: Not on file   Other Topics Concern  . 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     Review of Systems: General: negative for chills, fever, night sweats or weight changes.  Cardiovascular: negative for chest pain, dyspnea on exertion, edema, orthopnea, palpitations, paroxysmal nocturnal dyspnea or shortness of breath Dermatological: negative for rash Respiratory: negative for cough or wheezing Urologic: negative for hematuria Abdominal: negative for nausea, vomiting, diarrhea, bright red blood per rectum, melena, or hematemesis Neurologic: negative for visual changes, syncope, or dizziness All other systems reviewed and are otherwise negative except as noted above.    Blood pressure 138/64, pulse  79, height 4\' 10"  (1.473 m), weight 129 lb (58.514 kg).  General appearance: alert and no distress Neck: no adenopathy, no carotid bruit, no JVD, supple, symmetrical, trachea midline and thyroid not enlarged, symmetric, no tenderness/mass/nodules Lungs: clear to auscultation bilaterally Heart: soft diastolic blow left sternal border Extremities: extremities normal, atraumatic, no cyanosis or edema  EKG normal sinus rhythm at 79 without ST or T wave changes  ASSESSMENT AND PLAN:   Dyslipidemia On statin therapy followed by her PCP  Hypertension Controlled on current medications  Moderate aortic regurgitation We've been following her  moderate aortic insufficiency by 2-D echocardiography her annual basis. This was last performed in September which showed normal LV systolic function, normal LV cavity size with moderate aortic insufficiency. She is completely asymptomatic. We'll continue to follow this on an annual basis.      Julia Harp MD FACP,FACC,FAHA, [facility] 05/28/2013 4:19 PM

## 2013-05-28 NOTE — Assessment & Plan Note (Signed)
On statin therapy followed by her PCP 

## 2013-05-28 NOTE — Patient Instructions (Signed)
We request that you follow-up in: 6 months with Luke Kilroy PA and in 1 year with Dr Berry  You will receive a reminder letter in the mail two months in advance. If you don't receive a letter, please call our office to schedule the follow-up appointment.   

## 2013-06-06 ENCOUNTER — Other Ambulatory Visit (INDEPENDENT_AMBULATORY_CARE_PROVIDER_SITE_OTHER): Payer: 59

## 2013-06-06 ENCOUNTER — Telehealth: Payer: Self-pay | Admitting: *Deleted

## 2013-06-06 ENCOUNTER — Ambulatory Visit (INDEPENDENT_AMBULATORY_CARE_PROVIDER_SITE_OTHER): Payer: 59 | Admitting: Gastroenterology

## 2013-06-06 ENCOUNTER — Encounter: Payer: Self-pay | Admitting: Gastroenterology

## 2013-06-06 VITALS — BP 120/50 | HR 68 | Ht <= 58 in | Wt 128.2 lb

## 2013-06-06 DIAGNOSIS — K921 Melena: Secondary | ICD-10-CM

## 2013-06-06 DIAGNOSIS — R195 Other fecal abnormalities: Secondary | ICD-10-CM

## 2013-06-06 LAB — CBC WITH DIFFERENTIAL/PLATELET
BASOS ABS: 0 10*3/uL (ref 0.0–0.1)
Basophils Relative: 0.9 % (ref 0.0–3.0)
Eosinophils Absolute: 0.1 10*3/uL (ref 0.0–0.7)
Eosinophils Relative: 1.9 % (ref 0.0–5.0)
HEMATOCRIT: 38.6 % (ref 36.0–46.0)
Hemoglobin: 12.7 g/dL (ref 12.0–15.0)
Lymphocytes Relative: 29.5 % (ref 12.0–46.0)
Lymphs Abs: 1.4 10*3/uL (ref 0.7–4.0)
MCHC: 32.9 g/dL (ref 30.0–36.0)
MCV: 96 fl (ref 78.0–100.0)
MONOS PCT: 10.8 % (ref 3.0–12.0)
Monocytes Absolute: 0.5 10*3/uL (ref 0.1–1.0)
NEUTROS PCT: 56.9 % (ref 43.0–77.0)
Neutro Abs: 2.7 10*3/uL (ref 1.4–7.7)
PLATELETS: 216 10*3/uL (ref 150.0–400.0)
RBC: 4.02 Mil/uL (ref 3.87–5.11)
RDW: 14.2 % (ref 11.5–15.5)
WBC: 4.8 10*3/uL (ref 4.0–10.5)

## 2013-06-06 NOTE — Assessment & Plan Note (Signed)
Patient is asymptomatic.  Last colonoscopy 2012 normal.  GI bleeding sources including AVMs, peptic ulcer disease, polyps or neoplasm are considerations.    Recommendations #1 upper endoscopy #2 CBC #3 if CBC is normal an endoscopy is negative I will repeat Hemoccults

## 2013-06-06 NOTE — Telephone Encounter (Signed)
  06/06/2013   RE: Julia Mcconnell DOB: 1935/12/26 MRN: [id number]   Dear  Thayer Jew Pharr,MD    We have scheduled the above patient for an endoscopic procedure. Our records show that she is on anticoagulation therapy.   Please advise as to how long the patient may come off her therapy of Plavix prior to the procedure, which is scheduled for 07/02/2013 .  Please fax back/ or route the completed form to [place] at [phone].   Sincerely,    Oda Kilts

## 2013-06-06 NOTE — Patient Instructions (Addendum)
Go to the basement for labs today  You will be contacted by our office prior to your procedure for directions on holding your Plavix.  If you do not hear from our office 1 week prior to your scheduled procedure, please call [phone] to discuss.   You have been scheduled for an endoscopy with propofol. Please follow written instructions given to you at your visit today. If you use inhalers (even only as needed), please bring them with you on the day of your procedure. Your physician has requested that you go to www.startemmi.com and enter the access code given to you at your visit today. This web site gives a general overview about your procedure. However, you should still follow specific instructions given to you by our office regarding your preparation for the procedure.

## 2013-06-06 NOTE — Progress Notes (Signed)
_                                                                                                                History of Present Illness: Pleasant 78 year old white female referred for evaluation of Hemoccult-positive stool.  This was noted on routine testing.  On occasion patient may see small amounts of blood on the toilet tissue.  This did not occur when she submitted stool samples.  Colonoscopy in 2012 was normal.  She denies change of bowel habits, abdominal or rectal pain.  She's on no gastric irritants including nonsteroidals.  Patient is on Plavix.    Past Medical History  Diagnosis Date  . Hypertension   . Osteoporosis   . Aortic insufficiency   . Hyperlipidemia    Past Surgical History  Procedure Laterality Date  . Cataract extraction, bilateral    . Diskectomy neck    . Tumor right shoulder    . Abdominal hysterectomy     family history includes Breast cancer in her maternal aunt; Diabetes in her maternal aunt and sister; Stroke (age of onset: 73) in her father; Throat cancer in her maternal grandfather. Current Outpatient Prescriptions  Medication Sig Dispense Refill  . alendronate (FOSAMAX) 70 MG tablet Take 70 mg by mouth every 7 (seven) days. Sunday Take with a full glass of water on an empty stomach.      Marland Kitchen atorvastatin (LIPITOR) 20 MG tablet Take 20 mg by mouth daily.      . calcium carbonate (OS-CAL) 600 MG TABS Take 600 mg by mouth 2 (two) times daily.       . carvedilol (COREG) 12.5 MG tablet Take 0.5 tablets (6.25 mg total) by mouth 2 (two) times daily with a meal.  30 tablet  7  . cholecalciferol (VITAMIN D) 400 UNITS TABS Take 400 Units by mouth.        . clopidogrel (PLAVIX) 75 MG tablet Take 1 tablet (75 mg total) by mouth daily.  15 tablet  0  . furosemide (LASIX) 20 MG tablet Take 20 mg by mouth daily.      Marland Kitchen gabapentin (NEURONTIN) 100 MG capsule Take 100 mg by mouth daily.      Marland Kitchen losartan (COZAAR) 50 MG tablet Take 50 mg by mouth  daily.      . Multiple Vitamin (MULTIVITAMIN) tablet Take 1 tablet by mouth daily.         No current facility-administered medications for this visit.   Allergies as of 06/06/2013 - Review Complete 06/06/2013  Allergen Reaction Noted  . Codeine Other (See Comments) 04/03/2012    reports that she has never smoked. She has never used smokeless tobacco. She reports that she does not drink alcohol or use illicit drugs.     Review of Systems: She complains of some lower back pain Pertinent positive and negative review of systems were noted in the above HPI section. All other review of systems were otherwise negative.  Vital signs were reviewed in today's medical record  Physical Exam: General: Well developed , well nourished, no acute distress Skin: anicteric Head: Normocephalic and atraumatic Eyes:  sclerae anicteric, EOMI Ears: Normal auditory acuity Mouth: No deformity or lesions Neck: Supple, no masses or thyromegaly Lungs: Clear throughout to auscultation Heart: Regular rate and rhythm; norubs or bruits Abdomen: Soft, non tender and non distended. No masses, hepatosplenomegaly or hernias noted. Normal Bowel sounds.  There is a 2/6 early systolic murmur heard best at the right second intercostal space Rectal:deferred Musculoskeletal: Symmetrical with no gross deformities  Skin: No lesions on visible extremities Pulses:  Normal pulses noted Extremities: No clubbing, cyanosis, edema or deformities noted Neurological: Alert oriented x 4, grossly nonfocal Cervical Nodes:  No significant cervical adenopathy Inguinal Nodes: No significant inguinal adenopathy Psychological:  Alert and cooperative. Normal mood and affect  See Assessment and Plan under Problem List

## 2013-06-16 ENCOUNTER — Other Ambulatory Visit (HOSPITAL_COMMUNITY): Payer: Self-pay | Admitting: Cardiovascular Disease

## 2013-06-18 NOTE — Telephone Encounter (Signed)
Rx was sent to pharmacy electronically. 

## 2013-06-22 ENCOUNTER — Encounter: Payer: Self-pay | Admitting: Gastroenterology

## 2013-06-25 NOTE — Telephone Encounter (Signed)
Have not heard baback from Dr Linnell Fulling office yet about plavix  Waiting on a nurse to call me back

## 2013-06-26 NOTE — Telephone Encounter (Signed)
Per Dr Linnell Fulling office------- OK TO HOLD PLAVIX 7 DAYS FOR PROCEDURE  PT AWARE

## 2013-07-02 ENCOUNTER — Other Ambulatory Visit: Payer: Self-pay | Admitting: *Deleted

## 2013-07-02 ENCOUNTER — Other Ambulatory Visit (INDEPENDENT_AMBULATORY_CARE_PROVIDER_SITE_OTHER): Payer: 59

## 2013-07-02 ENCOUNTER — Telehealth: Payer: Self-pay

## 2013-07-02 ENCOUNTER — Other Ambulatory Visit: Payer: Self-pay

## 2013-07-02 ENCOUNTER — Ambulatory Visit (AMBULATORY_SURGERY_CENTER): Payer: 59 | Admitting: Gastroenterology

## 2013-07-02 ENCOUNTER — Encounter: Payer: Self-pay | Admitting: Gastroenterology

## 2013-07-02 VITALS — BP 169/75 | HR 67 | Temp 97.1°F | Resp 15 | Ht <= 58 in | Wt 128.0 lb

## 2013-07-02 DIAGNOSIS — R195 Other fecal abnormalities: Secondary | ICD-10-CM

## 2013-07-02 DIAGNOSIS — K921 Melena: Secondary | ICD-10-CM

## 2013-07-02 DIAGNOSIS — K222 Esophageal obstruction: Secondary | ICD-10-CM

## 2013-07-02 DIAGNOSIS — R109 Unspecified abdominal pain: Secondary | ICD-10-CM

## 2013-07-02 LAB — BASIC METABOLIC PANEL
BUN: 10 mg/dL (ref 6–23)
CO2: 32 mEq/L (ref 19–32)
Calcium: 8.8 mg/dL (ref 8.4–10.5)
Chloride: 105 mEq/L (ref 96–112)
Creatinine, Ser: 0.6 mg/dL (ref 0.4–1.2)
GFR: 103.34 mL/min (ref >60.00)
Glucose, Bld: 103 mg/dL — ABNORMAL HIGH (ref 70–99)
POTASSIUM: 3.3 meq/L — AB (ref 3.5–5.1)
Sodium: 140 mEq/L (ref 135–145)

## 2013-07-02 LAB — CBC WITH DIFFERENTIAL/PLATELET
BASOS PCT: 0.3 % (ref 0.0–3.0)
Basophils Absolute: 0 10*3/uL (ref 0.0–0.1)
EOS ABS: 0.1 10*3/uL (ref 0.0–0.7)
Eosinophils Relative: 2.5 % (ref 0.0–5.0)
HCT: 34.9 % — ABNORMAL LOW (ref 36.0–46.0)
Hemoglobin: 11.7 g/dL — ABNORMAL LOW (ref 12.0–15.0)
LYMPHS PCT: 38.2 % (ref 12.0–46.0)
Lymphs Abs: 1.2 10*3/uL (ref 0.7–4.0)
MCHC: 33.5 g/dL (ref 30.0–36.0)
MCV: 96.3 fl (ref 78.0–100.0)
Monocytes Absolute: 0.2 10*3/uL (ref 0.1–1.0)
Monocytes Relative: 6.9 % (ref 3.0–12.0)
NEUTROS PCT: 52.1 % (ref 43.0–77.0)
Neutro Abs: 1.6 10*3/uL (ref 1.4–7.7)
Platelets: 189 10*3/uL (ref 150.0–400.0)
RBC: 3.63 Mil/uL — AB (ref 3.87–5.11)
RDW: 14.3 % (ref 11.5–15.5)
WBC: 3.1 10*3/uL — ABNORMAL LOW (ref 4.0–10.5)

## 2013-07-02 LAB — PROTIME-INR
INR: 1.2 ratio — AB (ref 0.8–1.0)
Prothrombin Time: 13 s (ref 9.6–13.1)

## 2013-07-02 MED ORDER — SODIUM CHLORIDE 0.9 % IV SOLN: 500.0000 mL | INTRAVENOUS | Status: DC

## 2013-07-02 NOTE — Progress Notes (Signed)
Procedure ends, to recovery, report given and VSS. 

## 2013-07-02 NOTE — Telephone Encounter (Signed)
Pt scheduled for CT of abdomen at [facility] 07/06/13@2 :30pm. Pt to be NPO after 10:30am. Pt to drink bottle 1 of contrast at 12:30pm, second bottle of contrast at 1:30pm. Pt aware of appt and to have labs today before leaving.

## 2013-07-02 NOTE — Patient Instructions (Signed)
Discharge instructions given with verbal understanding. Hemoccults cards given in recovery room. Office will schedule CT SCAN of abdomin. Labs upon discharge. Resume previous medications. YOU HAD AN ENDOSCOPIC PROCEDURE TODAY AT [facility]: Refer to the procedure report that was given to you for any specific questions about what was found during the examination.  If the procedure report does not answer your questions, please call your gastroenterologist to clarify.  If you requested that your care partner not be given the details of your procedure findings, then the procedure report has been included in a sealed envelope for you to review at your convenience later.  YOU SHOULD EXPECT: Some feelings of bloating in the abdomen. Passage of more gas than usual.  Walking can help get rid of the air that was put into your GI tract during the procedure and reduce the bloating. If you had a lower endoscopy (such as a colonoscopy or flexible sigmoidoscopy) you may notice spotting of blood in your stool or on the toilet paper. If you underwent a bowel prep for your procedure, then you may not have a normal bowel movement for a few days.  DIET: Your first meal following the procedure should be a light meal and then it is ok to progress to your normal diet.  A half-sandwich or bowl of soup is an example of a good first meal.  Heavy or fried foods are harder to digest and may make you feel nauseous or bloated.  Likewise meals heavy in dairy and vegetables can cause extra gas to form and this can also increase the bloating.  Drink plenty of fluids but you should avoid alcoholic beverages for 24 hours.  ACTIVITY: Your care partner should take you home directly after the procedure.  You should plan to take it easy, moving slowly for the rest of the day.  You can resume normal activity the day after the procedure however you should NOT DRIVE or use heavy machinery for 24 hours (because of the sedation  medicines used during the test).    SYMPTOMS TO REPORT IMMEDIATELY: A gastroenterologist can be reached at any hour.  During normal business hours, 8:30 AM to 5:00 PM Monday through Friday, call [phone].  After hours and on weekends, please call the GI answering service at [phone] who will take a message and have the physician on call contact you.   Following lower endoscopy (colonoscopy or flexible sigmoidoscopy):  Excessive amounts of blood in the stool  Significant tenderness or worsening of abdominal pains  Swelling of the abdomen that is new, acute  Fever of 100F or higher  FOLLOW UP: If any biopsies were taken you will be contacted by phone or by letter within the next 1-3 weeks.  Call your gastroenterologist if you have not heard about the biopsies in 3 weeks.  Our staff will call the home number listed on your records the next business day following your procedure to check on you and address any questions or concerns that you may have at that time regarding the information given to you following your procedure. This is a courtesy call and so if there is no answer at the home number and we have not heard from you through the emergency physician on call, we will assume that you have returned to your regular daily activities without incident.  SIGNATURES/CONFIDENTIALITY: You and/or your care partner have signed paperwork which will be entered into your electronic medical record.  These signatures attest to the  fact that that the information above on your After Visit Summary has been reviewed and is understood.  Full responsibility of the confidentiality of this discharge information lies with you and/or your care-partner.

## 2013-07-02 NOTE — Op Note (Signed)
[place]  [address]. [place], [address]   ENDOSCOPY PROCEDURE REPORT  PATIENT: Julia Mcconnell, Julia Mcconnell  MR#: [id number] BIRTHDATE: 07/24/1935 , 77  yrs. old GENDER: Female ENDOSCOPIST: Inda Castle, MD REFERRED BY: PROCEDURE DATE:  07/02/2013 PROCEDURE:  EGD, diagnostic ASA CLASS:     Class II INDICATIONS:  Heme positive stool. MEDICATIONS: MAC sedation, administered by CRNA, Propofol (Diprivan) 80 mg IV, and Simethicone 0.6cc PO TOPICAL ANESTHETIC:  DESCRIPTION OF PROCEDURE: After the risks benefits and alternatives of the procedure were thoroughly explained, informed consent was obtained.  The LB [id number] endoscope was introduced through the mouth and advanced to the third portion of the duodenum. Without limitations.  The instrument was slowly withdrawn as the mucosa was fully examined.      At the GE junction there was a nonobstructive early stricture. There was questionable trace to 1+ varices in the distal one third of the esophagus.   At the GE junction there was a nonobstructive early stricture.  There was questionable trace to 1+ varices in the distal one third of the esophagus.   The remainder of the upper endoscopy exam was otherwise normal.  Retroflexed views revealed no abnormalities.     The scope was then withdrawn from the patient and the procedure completed.  COMPLICATIONS: There were no complications. ENDOSCOPIC IMPRESSION: 1.   early stricture GE junction 2.  questionable esophageal varices  RECOMMENDATIONS: 1.  followup Hemoccults and CBC (BMET, INR) 2.  CT of the abdomen 3.  resume Plavix today  REPEAT EXAM:  eSigned:  Inda Castle, MD 07/02/2013 3:51 PM   DP:OEUMPN D Shelia Media, MD

## 2013-07-02 NOTE — Telephone Encounter (Signed)
[facility] nurse called and states this date will not work for the pt. Instructed them to give the pt the phone number to the [facility] to reschedule her appt to a date that works for her.

## 2013-07-03 ENCOUNTER — Telehealth: Payer: Self-pay | Admitting: *Deleted

## 2013-07-03 NOTE — Telephone Encounter (Signed)
  Follow up Call-  Call back number 07/02/2013  Post procedure Call Back phone  # [phone]  Permission to leave phone message Yes     Patient questions:  Do you have a fever, pain , or abdominal swelling? no Pain Score  0 *  Have you tolerated food without any problems? yes  Have you been able to return to your normal activities? yes  Do you have any questions about your discharge instructions: Diet   no Medications  no Follow up visit  no  Do you have questions or concerns about your Care? no  Actions: * If pain score is 4 or above: No action needed, pain <4.

## 2013-07-06 ENCOUNTER — Ambulatory Visit (INDEPENDENT_AMBULATORY_CARE_PROVIDER_SITE_OTHER)
Admission: RE | Admit: 2013-07-06 | Discharge: 2013-07-06 | Disposition: A | Discharge status: Home / Self Care | Payer: 59 | Source: Ambulatory Visit | Attending: Gastroenterology | Admitting: Gastroenterology

## 2013-07-06 DIAGNOSIS — K921 Melena: Secondary | ICD-10-CM

## 2013-07-06 DIAGNOSIS — R109 Unspecified abdominal pain: Secondary | ICD-10-CM

## 2013-07-06 MED ORDER — IOHEXOL 300 MG/ML  SOLN
100.0000 mL | Freq: Once | INTRAMUSCULAR | Status: AC | PRN
  Administered 2013-07-06: 100 mL via INTRAVENOUS

## 2013-07-06 NOTE — Progress Notes (Signed)
Quick Note:  Please inform the patient that CT was normal and to continue current plan of action ______ 

## 2013-07-12 ENCOUNTER — Telehealth: Payer: Self-pay | Admitting: Gastroenterology

## 2013-07-12 NOTE — Telephone Encounter (Signed)
Pt states she is out of town and wants to know if it is ok for her to wait until she returns home to do her stool cards. Discussed with pt that it is fine to wait until she returns.

## 2013-07-17 ENCOUNTER — Other Ambulatory Visit: Payer: Self-pay

## 2013-07-17 DIAGNOSIS — R195 Other fecal abnormalities: Secondary | ICD-10-CM

## 2013-07-30 ENCOUNTER — Other Ambulatory Visit (INDEPENDENT_AMBULATORY_CARE_PROVIDER_SITE_OTHER): Payer: 59

## 2013-07-30 ENCOUNTER — Telehealth: Payer: Self-pay | Admitting: Gastroenterology

## 2013-07-30 DIAGNOSIS — R195 Other fecal abnormalities: Secondary | ICD-10-CM

## 2013-07-30 LAB — HEMOCCULT SLIDES (X 3 CARDS)
Fecal Occult Blood: NEGATIVE
OCCULT 1: NEGATIVE
OCCULT 2: NEGATIVE
OCCULT 3: NEGATIVE
OCCULT 4: NEGATIVE
OCCULT 5: NEGATIVE

## 2013-07-30 NOTE — Telephone Encounter (Signed)
Spoke with pt and let her know stool card results were negative.

## 2013-07-31 ENCOUNTER — Other Ambulatory Visit: Payer: Self-pay

## 2013-07-31 DIAGNOSIS — D539 Nutritional anemia, unspecified: Secondary | ICD-10-CM

## 2013-08-01 ENCOUNTER — Other Ambulatory Visit (INDEPENDENT_AMBULATORY_CARE_PROVIDER_SITE_OTHER): Payer: 59

## 2013-08-01 DIAGNOSIS — D539 Nutritional anemia, unspecified: Secondary | ICD-10-CM

## 2013-08-01 LAB — CBC WITH DIFFERENTIAL/PLATELET
BASOS ABS: 0 10*3/uL (ref 0.0–0.1)
Basophils Relative: 0.6 % (ref 0.0–3.0)
EOS PCT: 2.1 % (ref 0.0–5.0)
Eosinophils Absolute: 0.1 10*3/uL (ref 0.0–0.7)
HEMATOCRIT: 36 % (ref 36.0–46.0)
Hemoglobin: 11.9 g/dL — ABNORMAL LOW (ref 12.0–15.0)
Lymphocytes Relative: 30.7 % (ref 12.0–46.0)
Lymphs Abs: 1.4 10*3/uL (ref 0.7–4.0)
MCHC: 32.9 g/dL (ref 30.0–36.0)
MCV: 96.2 fl (ref 78.0–100.0)
MONO ABS: 0.4 10*3/uL (ref 0.1–1.0)
Monocytes Relative: 8.7 % (ref 3.0–12.0)
Neutro Abs: 2.6 10*3/uL (ref 1.4–7.7)
Neutrophils Relative %: 57.9 % (ref 43.0–77.0)
PLATELETS: 194 10*3/uL (ref 150.0–400.0)
RBC: 3.74 Mil/uL — ABNORMAL LOW (ref 3.87–5.11)
RDW: 14.4 % (ref 11.5–15.5)
WBC: 4.5 10*3/uL (ref 4.0–10.5)

## 2013-11-26 ENCOUNTER — Encounter: Payer: Self-pay | Admitting: Cardiology

## 2013-11-26 ENCOUNTER — Ambulatory Visit (INDEPENDENT_AMBULATORY_CARE_PROVIDER_SITE_OTHER): Payer: 59 | Admitting: Cardiology

## 2013-11-26 VITALS — BP 134/62 | HR 57 | Ht <= 58 in | Wt 131.6 lb

## 2013-11-26 DIAGNOSIS — I351 Nonrheumatic aortic (valve) insufficiency: Secondary | ICD-10-CM

## 2013-11-26 NOTE — Progress Notes (Signed)
11/26/2013 Julia Mcconnell   [birth year]  [id number]  Primary Physician Horatio Pel, MD Primary Cardiologist: Dr. Gwenlyn Found  HPI: The patient is a 78 y/o female, followed by Dr. Gwenlyn Found, who presents to clinic today for routine 6 month follow-up. Her past medical history is significant for hypertension, hyperlipidemia and moderate to severe aortic insufficiency, which she has been asymptomatic from. Her last echocardiogram was 09/18/2012, revealing normal systolic function with an estimated ejection fraction of 55-60%. This continued to show moderate aortic regurgitation. VTI ratio of LVOT to aortic valve: 0.45. Peak velocity ratio of LVOT to aortic valve: 0.37. Mean gradient: 30mm Hg (S). There was no aortic stenosis. She is followed medically by Dr. Shelia Media. He follows her lipid profile.   She presents to clinic today without complaints. She denies any anginal like symptoms. No dyspnea, orthopnea, PND or lower extremity edema. No palpitations, dizziness, syncope/near-syncope. She reports full medication compliance. She denies tobacco use. Her EKG today demonstrates sinus bradycardia with a ventricular rate of 57 bpm. Her blood pressure is stable at 134/62. Dr. Gwenlyn Found has ordered for her to undergo yearly assessment with a 2-D echocardiogram to monitor her aortic insufficiency. This was ordered to be completed by September of this year, however the patient has yet to get this done.   Current Outpatient Prescriptions  Medication Sig Dispense Refill  . alendronate (FOSAMAX) 70 MG tablet Take 70 mg by mouth every 7 (seven) days. Sunday Take with a full glass of water on an empty stomach.    Marland Kitchen atorvastatin (LIPITOR) 20 MG tablet Take 20 mg by mouth daily.    . calcium carbonate (OS-CAL) 600 MG TABS Take 600 mg by mouth 2 (two) times daily.     . carvedilol (COREG) 12.5 MG tablet TAKE ONE-HALF TABLET BY MOUTH TWICE DAILY WITH A MEAL 30 tablet 10  . cholecalciferol (VITAMIN D) 400 UNITS TABS  Take 400 Units by mouth.      . clopidogrel (PLAVIX) 75 MG tablet Take 1 tablet (75 mg total) by mouth daily. 15 tablet 0  . furosemide (LASIX) 20 MG tablet Take 20 mg by mouth daily.    Marland Kitchen gabapentin (NEURONTIN) 100 MG capsule Take 100 mg by mouth daily.    Marland Kitchen losartan (COZAAR) 50 MG tablet Take 50 mg by mouth daily.    . Multiple Vitamin (MULTIVITAMIN) tablet Take 1 tablet by mouth daily.       No current facility-administered medications for this visit.    Allergies  Allergen Reactions  . Codeine Other (See Comments)    Makes me really hyper."    History   Social History  . Marital Status: Single    Spouse Name: N/A    Number of Children: o  . Years of Education: 12   Occupational History  .  [employer]  . clerk    Social History Main Topics  . Smoking status: Never Smoker   . Smokeless tobacco: Never Used  . Alcohol Use: No  . Drug Use: No  . Sexual Activity: Not on file   Other Topics Concern  . 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     Review of Systems: General: negative for chills, fever, night sweats or weight changes.  Cardiovascular: negative for chest pain, dyspnea on exertion, edema, orthopnea, palpitations, paroxysmal nocturnal dyspnea or shortness of breath Dermatological: negative for rash Respiratory: negative for cough or wheezing Urologic: negative for hematuria  Abdominal: negative for nausea, vomiting, diarrhea, bright red blood per rectum, melena, or hematemesis Neurologic: negative for visual changes, syncope, or dizziness All other systems reviewed and are otherwise negative except as noted above.    Blood pressure 134/62, pulse 57, height 4\' 10"  (1.473 m), weight 131 lb 9.6 oz (59.693 kg).  General appearance: alert, cooperative and no distress Neck: no carotid bruit and no JVD Lungs: clear to auscultation bilaterally Heart: regular rate and rhythm and 1/6 murmur along RSB Extremities:  no LEE Pulses: 2+ and symmetric Skin: warm and dry Neurologic: Grossly normal  EKG sinus bradycardia. Heart rate 57 bpm. No ischemic abnormalities.  ASSESSMENT AND PLAN:   1. Aortic insufficiency: She denies any dyspnea or chest pain. Last 2-D echocardiogram was over 1 year ago and revealed moderate regurgitation. She is due for her yearly echocardiogram. Will order to have this done.  2. Hypertension: Pressure is well-controlled at 134/62. Continue carvedilol, Lasix and losartan.  3. Hyperlipidemia: currently on Lipitor. Lipid profile is followed by her PCP.  PLAN  Julia Mcconnell appears stable from a cardiac standpoint. Physical exam is fairly benign. Vital signs are stable. We will order for her to undergo repeat evaluation with a 2-D echocardiogram to monitor the status of her aortic valve. Continue primary prevention for cardiovascular events with continued treatment of her hypertension and hyperlipidemia. Continue medical therapy with Plavix, carvedilol, losartan and Lipitor. Follow-up with Dr. Gwenlyn Found in 6 months or sooner if needed.  SIMMONS, BRITTAINYPA-C 11/26/2013 10:17 AM

## 2013-11-26 NOTE — Patient Instructions (Signed)
Continue current medications.  Your physician has requested that you have an echocardiogram. Echocardiography is a painless test that uses sound waves to create images of your heart. It provides your doctor with information about the size and shape of your heart and how well your heart's chambers and valves are working. This procedure takes approximately one hour. There are no restrictions for this procedure.  Your physician recommends that you schedule a follow-up appointment in: 6 months with Dr. Gwenlyn Found.

## 2013-12-03 ENCOUNTER — Ambulatory Visit (HOSPITAL_COMMUNITY): Payer: 59

## 2013-12-10 ENCOUNTER — Ambulatory Visit (HOSPITAL_COMMUNITY)
Admission: RE | Admit: 2013-12-10 | Discharge: 2013-12-10 | Disposition: A | Discharge status: Home / Self Care | Payer: 59 | Source: Ambulatory Visit | Attending: Cardiology | Admitting: Cardiology

## 2013-12-10 DIAGNOSIS — I10 Essential (primary) hypertension: Secondary | ICD-10-CM | POA: Diagnosis not present

## 2013-12-10 DIAGNOSIS — I359 Nonrheumatic aortic valve disorder, unspecified: Secondary | ICD-10-CM | POA: Diagnosis present

## 2013-12-10 DIAGNOSIS — I351 Nonrheumatic aortic (valve) insufficiency: Secondary | ICD-10-CM

## 2013-12-10 DIAGNOSIS — E785 Hyperlipidemia, unspecified: Secondary | ICD-10-CM | POA: Insufficient documentation

## 2013-12-10 NOTE — Progress Notes (Signed)
2D Echo Performed 12/10/2013    Julia Mcconnell, RCS  

## 2014-03-08 ENCOUNTER — Other Ambulatory Visit: Payer: Self-pay

## 2014-03-25 ENCOUNTER — Observation Stay (HOSPITAL_COMMUNITY)
Admission: EM | Admit: 2014-03-25 | Discharge: 2014-03-27 | Disposition: A | Discharge status: Home / Self Care | Payer: Medicare Other | Attending: Surgery | Admitting: Surgery

## 2014-03-25 ENCOUNTER — Encounter (HOSPITAL_COMMUNITY): Payer: Self-pay | Admitting: Emergency Medicine

## 2014-03-25 ENCOUNTER — Other Ambulatory Visit (HOSPITAL_COMMUNITY): Payer: Self-pay

## 2014-03-25 ENCOUNTER — Emergency Department (HOSPITAL_COMMUNITY): Payer: Medicare Other

## 2014-03-25 DIAGNOSIS — R0789 Other chest pain: Secondary | ICD-10-CM | POA: Insufficient documentation

## 2014-03-25 DIAGNOSIS — M810 Age-related osteoporosis without current pathological fracture: Secondary | ICD-10-CM | POA: Diagnosis not present

## 2014-03-25 DIAGNOSIS — Y92410 Unspecified street and highway as the place of occurrence of the external cause: Secondary | ICD-10-CM | POA: Diagnosis not present

## 2014-03-25 DIAGNOSIS — Z79899 Other long term (current) drug therapy: Secondary | ICD-10-CM | POA: Insufficient documentation

## 2014-03-25 DIAGNOSIS — E785 Hyperlipidemia, unspecified: Secondary | ICD-10-CM | POA: Diagnosis not present

## 2014-03-25 DIAGNOSIS — Z885 Allergy status to narcotic agent status: Secondary | ICD-10-CM | POA: Diagnosis not present

## 2014-03-25 DIAGNOSIS — I739 Peripheral vascular disease, unspecified: Secondary | ICD-10-CM | POA: Diagnosis not present

## 2014-03-25 DIAGNOSIS — I10 Essential (primary) hypertension: Secondary | ICD-10-CM | POA: Diagnosis not present

## 2014-03-25 DIAGNOSIS — Z8673 Personal history of transient ischemic attack (TIA), and cerebral infarction without residual deficits: Secondary | ICD-10-CM | POA: Diagnosis not present

## 2014-03-25 DIAGNOSIS — S2220XA Unspecified fracture of sternum, initial encounter for closed fracture: Principal | ICD-10-CM | POA: Insufficient documentation

## 2014-03-25 HISTORY — DX: Dependence on other enabling machines and devices: Z99.89

## 2014-03-25 HISTORY — DX: Other chronic pain: G89.29

## 2014-03-25 HISTORY — DX: Low back pain: M54.5

## 2014-03-25 HISTORY — DX: Unspecified osteoarthritis, unspecified site: M19.90

## 2014-03-25 HISTORY — DX: Obstructive sleep apnea (adult) (pediatric): G47.33

## 2014-03-25 MED ORDER — SODIUM CHLORIDE 0.9 % IV SOLN
Freq: Once | INTRAVENOUS | Status: AC
  Administered 2014-03-26: 05:00:00 via INTRAVENOUS

## 2014-03-25 MED ORDER — IBUPROFEN 400 MG PO TABS
600.0000 mg | ORAL_TABLET | Freq: Once | ORAL | Status: AC
  Administered 2014-03-25: 600 mg via ORAL
  Filled 2014-03-25: qty 2
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
## 2014-03-26 ENCOUNTER — Encounter (HOSPITAL_COMMUNITY): Payer: Self-pay

## 2014-03-26 ENCOUNTER — Emergency Department (HOSPITAL_COMMUNITY): Payer: Medicare Other

## 2014-03-26 DIAGNOSIS — S2220XA Unspecified fracture of sternum, initial encounter for closed fracture: Secondary | ICD-10-CM | POA: Diagnosis not present

## 2014-03-26 LAB — CBC WITH DIFFERENTIAL/PLATELET
Basophils Absolute: 0 10*3/uL (ref 0.0–0.1)
Basophils Relative: 0 % (ref 0–1)
Eosinophils Absolute: 0.1 10*3/uL (ref 0.0–0.7)
Eosinophils Relative: 2 % (ref 0–5)
HCT: 41.7 % (ref 36.0–46.0)
Hemoglobin: 13.9 g/dL (ref 12.0–15.0)
Lymphocytes Relative: 15 % (ref 12–46)
Lymphs Abs: 1.1 10*3/uL (ref 0.7–4.0)
MCH: 31.6 pg (ref 26.0–34.0)
MCHC: 33.3 g/dL (ref 30.0–36.0)
MCV: 94.8 fL (ref 78.0–100.0)
Monocytes Absolute: 0.4 10*3/uL (ref 0.1–1.0)
Monocytes Relative: 5 % (ref 3–12)
Neutro Abs: 5.5 10*3/uL (ref 1.7–7.7)
Neutrophils Relative %: 78 % — ABNORMAL HIGH (ref 43–77)
Platelets: 186 10*3/uL (ref 150–400)
RBC: 4.4 MIL/uL (ref 3.87–5.11)
RDW: 14.7 % (ref 11.5–15.5)
WBC: 7 10*3/uL (ref 4.0–10.5)

## 2014-03-26 LAB — COMPREHENSIVE METABOLIC PANEL
ALT: 21 U/L (ref 0–35)
AST: 28 U/L (ref 0–37)
Albumin: 4.2 g/dL (ref 3.5–5.2)
Alkaline Phosphatase: 87 U/L (ref 39–117)
Anion gap: 8 (ref 5–15)
BUN: 13 mg/dL (ref 6–23)
CO2: 29 mmol/L (ref 19–32)
Calcium: 9.6 mg/dL (ref 8.4–10.5)
Chloride: 101 mmol/L (ref 96–112)
Creatinine, Ser: 0.66 mg/dL (ref 0.50–1.10)
GFR calc Af Amer: >90 mL/min (ref >90)
GFR calc non Af Amer: 84 mL/min — ABNORMAL LOW (ref >90)
Glucose, Bld: 133 mg/dL — ABNORMAL HIGH (ref 70–99)
Potassium: 3.7 mmol/L (ref 3.5–5.1)
Sodium: 138 mmol/L (ref 135–145)
Total Bilirubin: 0.4 mg/dL (ref 0.3–1.2)
Total Protein: 7.6 g/dL (ref 6.0–8.3)

## 2014-03-26 LAB — PROTIME-INR
INR: 0.94 (ref 0.00–1.49)
Prothrombin Time: 12.7 seconds (ref 11.6–15.2)

## 2014-03-26 MED ORDER — ONDANSETRON HCL 4 MG/2ML IJ SOLN: 4.0000 mg | Freq: Four times a day (QID) | INTRAMUSCULAR | Status: DC | PRN

## 2014-03-26 MED ORDER — ONDANSETRON HCL 4 MG PO TABS: 4.0000 mg | ORAL_TABLET | Freq: Four times a day (QID) | ORAL | Status: DC | PRN

## 2014-03-26 MED ORDER — OXYCODONE HCL 5 MG PO TABS: 5.0000 mg | ORAL_TABLET | ORAL | Status: DC | PRN

## 2014-03-26 MED ORDER — PANTOPRAZOLE SODIUM 40 MG PO TBEC
40.0000 mg | DELAYED_RELEASE_TABLET | Freq: Every day | ORAL | Status: DC
  Administered 2014-03-26 – 2014-03-27 (×2): 40 mg via ORAL
  Filled 2014-03-26 (×2): qty 1

## 2014-03-26 MED ORDER — POTASSIUM CHLORIDE IN NACL 20-0.9 MEQ/L-% IV SOLN
INTRAVENOUS | Status: DC
  Administered 2014-03-26 (×2): via INTRAVENOUS
  Filled 2014-03-26 (×3): qty 1000

## 2014-03-26 MED ORDER — ACETAMINOPHEN 325 MG PO TABS
650.0000 mg | ORAL_TABLET | ORAL | Status: DC | PRN
  Administered 2014-03-26 – 2014-03-27 (×3): 650 mg via ORAL
  Filled 2014-03-26 (×3): qty 2

## 2014-03-26 MED ORDER — PANTOPRAZOLE SODIUM 40 MG IV SOLR: 40.0000 mg | Freq: Every day | INTRAVENOUS | Status: DC

## 2014-03-26 MED ORDER — CARVEDILOL 6.25 MG PO TABS
6.2500 mg | ORAL_TABLET | Freq: Two times a day (BID) | ORAL | Status: DC
  Administered 2014-03-26 – 2014-03-27 (×2): 6.25 mg via ORAL
  Filled 2014-03-26 (×4): qty 1

## 2014-03-26 MED ORDER — LOSARTAN POTASSIUM 50 MG PO TABS
100.0000 mg | ORAL_TABLET | Freq: Every day | ORAL | Status: DC
  Administered 2014-03-27: 100 mg via ORAL
  Filled 2014-03-26 (×2): qty 2

## 2014-03-26 MED ORDER — MORPHINE SULFATE 2 MG/ML IJ SOLN: 2.0000 mg | INTRAMUSCULAR | Status: DC | PRN

## 2014-03-26 MED ORDER — FUROSEMIDE 20 MG PO TABS
20.0000 mg | ORAL_TABLET | Freq: Every day | ORAL | Status: DC
  Administered 2014-03-26 – 2014-03-27 (×2): 20 mg via ORAL
  Filled 2014-03-26 (×2): qty 1

## 2014-03-26 MED ORDER — ATORVASTATIN CALCIUM 20 MG PO TABS
20.0000 mg | ORAL_TABLET | Freq: Every day | ORAL | Status: DC
  Administered 2014-03-26: 20 mg via ORAL
  Filled 2014-03-26 (×2): qty 1

## 2014-03-26 MED ORDER — IOHEXOL 300 MG/ML  SOLN
75.0000 mL | Freq: Once | INTRAMUSCULAR | Status: AC | PRN
  Administered 2014-03-26: 75 mL via INTRAVENOUS

## 2014-03-26 MED ORDER — GABAPENTIN 100 MG PO CAPS
100.0000 mg | ORAL_CAPSULE | Freq: Every day | ORAL | Status: DC
  Filled 2014-03-26 (×2): qty 1

## 2014-03-26 NOTE — ED Notes (Signed)
Place pt on 2 liters of oxygen (nasal cannula) due to low oxygen saturations
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
## 2014-03-26 NOTE — ED Notes (Signed)
Report given to
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
## 2014-03-26 NOTE — ED Notes (Signed)
Report given to 6N

## 2014-03-26 NOTE — ED Notes (Signed)
Ambulated to bathroom with 1 assist-- ambulated without difficulty.
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
## 2014-03-27 DIAGNOSIS — S2220XA Unspecified fracture of sternum, initial encounter for closed fracture: Secondary | ICD-10-CM | POA: Diagnosis not present

## 2014-03-27 MED ORDER — ACETAMINOPHEN 325 MG PO TABS: 650.0000 mg | ORAL_TABLET | ORAL | Status: DC | PRN

## 2014-03-27 MED ORDER — CLOPIDOGREL BISULFATE 75 MG PO TABS
75.0000 mg | ORAL_TABLET | Freq: Once | ORAL | Status: DC
  Filled 2014-03-27: qty 1

## 2014-03-27 NOTE — Progress Notes (Signed)
UR completed 

## 2014-03-27 NOTE — Discharge Planning (Signed)
Patient educated on pain management, deep breathing, incentive spirometry, and informed to change positions slowly to decrease pain. Patient verbalized understanding.
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
## 2014-03-27 NOTE — Progress Notes (Signed)
Discharge instructions gone over with patient. Home medications gone over. Follow up appointment to be made. Signs and symptoms of worsening condition gone over and who to call. Diet and activity discussed. My chart discussed. Patient verbalized understanding of all instructions.
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
## 2014-03-27 NOTE — Discharge Instructions (Signed)
Sternal Fracture The sternum is the bone in the center of the front of your chest which your ribs attach to. It is also called the breastbone. The most common cause of a sternal fracture (break in the bone) is an injury. The most common injury is from a motor vehicle accident. The fracture often comes from the seat belt or hitting the chest on the steering wheel or being forcibly bent forward (shoulders toward your knees) during an accident. It is more common in females and the elderly. The fracture of the sternum is usually not a problem if there are no other injuries. Other injuries that may happen are to the ribs, heart, lungs, and abdominal organs. SYMPTOMS  Common complaints from a fracture of the sternum include:  Shortness of breath.  Pain with breathing or difficulty breathing.  Bruises about the chest.  Tenderness or a cracking sound at the breastbone. DIAGNOSIS  Your caregiver may be able to tell if the sternum is broken by examining you. Other times studies such as X-ray, CAT scan, ultrasound, and nuclear medicine are used to detect a fracture.  TREATMENT   Sternal fractures usually are not serious and if displacement is minimal, no treatment is necessary.  The main concern is with damage to the surrounding structures: ribs, heart, great vessels coming from the heart, and the backbone in the chest area.  Multiple rib fractures may cause breathing difficulties.  Injury to one of the large vessels in the chest may be a threat to life and require immediate surgery.  If injury to the heart or lungs is suspected it may be necessary to stay in the hospital and be monitored.  Other injuries will be treated as needed.  If the pieces of the breastbone are out of normal position, they may need to be reduced (put back in position) and then wired in place or fixed with a plate and screws during an operation. HOME CARE INSTRUCTIONS  1. Avoid strenuous activity. Be careful during  activities and avoid bumping or reinjuring the injured sternum. Activities that cause pain pull on the fracture site(s) and are best avoided if possible. 2. Eat a normal, well-balanced diet. Drink plenty of fluids to avoid constipation, a common side effect of pain medications. 3. Take deep breaths and cough several times a day, splinting the injured area with a pillow. This will help prevent pneumonia. 4. Do not wear a rib belt or binder for the chest unless instructed otherwise. These restrict breathing and can lead to pneumonia. 5. Only take over-the-counter or prescription medicines for pain, discomfort, or fever as directed by your caregiver. SEEK MEDICAL CARE IF:  You develop a continual cough, associated with thick or bloody mucus or phlegm (sputum). SEEK IMMEDIATE MEDICAL CARE IF:   You have a fever.  You have increasing difficulty breathing.  You feel sick to your stomach (nausea), vomit, or have abdominal pain.  You have worsening pain, not controlled with medications.  You develop pain in the tops of your shoulders (in the shoulder strap area).  You feel light-headed or faint.  You develop chest pain or an abnormal heartbeat (palpitations).  You develop pain radiating into the jaw, teeth or down the arms. Document Released: 08/12/2003 Document Revised: 05/14/2013 Document Reviewed: 04/01/2008 [facility] Patient Information 2015 [place], [place]. This information is not intended to replace advice given to you by your health care provider. Make sure you discuss any questions you have with your health care provider.  Incentive Spirometer An incentive  spirometer is a tool that can help keep your lungs clear and active. This tool measures how well you are filling your lungs with each breath. Taking long, deep breaths may help reverse or decrease the chance of developing breathing (pulmonary) problems (especially infection) following:  Surgery of the chest or abdomen.  Surgery if  you have a history of smoking or a lung problem.  A long period of time when you are unable to move or be active. BEFORE THE PROCEDURE   If the spirometer includes an indicator to show your best effort, your nurse or respiratory therapist will set it to a desired goal.  If possible, sit up straight or lean slightly forward. Try not to slouch.  Hold the incentive spirometer in an upright position. INSTRUCTIONS FOR USE  6. Sit on the edge of your bed if possible, or sit up as far as you can in bed or on a chair. 7. Hold the incentive spirometer in an upright position. 8. Breathe out normally. 9. Place the mouthpiece in your mouth and seal your lips tightly around it. 10. Breathe in slowly and as deeply as possible, raising the piston or the ball toward the top of the column. 11. Hold your breath for 3-5 seconds or for as long as possible. Allow the piston or ball to fall to the bottom of the column. 12. Remove the mouthpiece from your mouth and breathe out normally. 13. Rest for a few seconds and repeat Steps 1 through 7 at least 10 times every 1-2 hours when you are awake. Take your time and take a few normal breaths between deep breaths. 14. The spirometer may include an indicator to show your best effort. Use the indicator as a goal to work toward during each repetition. 15. After each set of 10 deep breaths, practice coughing to be sure your lungs are clear. If you have an incision (the cut made at the time of surgery), support your incision when coughing by placing a pillow or rolled-up towels firmly against it. Once you are able to get out of bed, walk around indoors and cough well. You may stop using the incentive spirometer when instructed by your caregiver.  RISKS AND COMPLICATIONS  Breathing too quickly may cause dizziness. At an extreme, this could cause you to pass out. Take your time so you do not get dizzy or light-headed.  If you are in pain, you may need to take or ask for  pain medication before doing incentive spirometry. It is harder to take a deep breath if you are having pain. AFTER USE  Rest and breathe slowly and easily.  It can be helpful to keep a log of your progress. Your caregiver can provide you with a simple table to help with this. If you are using the spirometer at home, follow these instructions: [place] IF:   You are having difficultly using the spirometer.  You have trouble using the spirometer as often as instructed.  Your pain medication is not giving enough relief while using the spirometer.  You develop fever of 100.44F (38.1C) or higher. SEEK IMMEDIATE MEDICAL CARE IF:   You cough up bloody sputum that had not been present before.  You develop fever of 102F (38.9C) or greater.  You develop worsening pain at or near the incision site. MAKE SURE YOU:   Understand these instructions.  Will watch your condition.  Will get help right away if you are not doing well or get worse. Document  Released: 05/10/2006 Document Revised: 05/14/2013 Document Reviewed: 07/11/2006 [facility] Patient Information 2015 [place], [place]. This information is not intended to replace advice given to you by your health care provider. Make sure you discuss any questions you have with your health care provider.

## 2014-05-31 ENCOUNTER — Ambulatory Visit (INDEPENDENT_AMBULATORY_CARE_PROVIDER_SITE_OTHER): Payer: 59 | Admitting: Cardiovascular Disease

## 2014-05-31 ENCOUNTER — Encounter: Payer: Self-pay | Admitting: Cardiovascular Disease

## 2014-05-31 VITALS — BP 142/72 | HR 68 | Wt 132.9 lb

## 2014-05-31 DIAGNOSIS — I10 Essential (primary) hypertension: Secondary | ICD-10-CM

## 2014-05-31 DIAGNOSIS — I351 Nonrheumatic aortic (valve) insufficiency: Secondary | ICD-10-CM

## 2014-05-31 DIAGNOSIS — E785 Hyperlipidemia, unspecified: Secondary | ICD-10-CM | POA: Diagnosis not present

## 2014-05-31 NOTE — Assessment & Plan Note (Signed)
History of hyperlipidemia on atorvastatin 20 mg a day followed by her PCP 

## 2014-05-31 NOTE — Assessment & Plan Note (Signed)
History of moderate aortic insufficiency with echo performed in November of last year that showed normal LV size and function with moderate AI and change from prior echoes. She is asymptomatic. We will continue to follow this on an annual basis.

## 2014-05-31 NOTE — Progress Notes (Signed)
05/31/2014 Julia Mcconnell   1935/04/27  [id number]  Primary Physician Julia Pel, MD Primary Cardiologist: Julia Harp MD Julia Mcconnell    HPI: The patient is a very pleasant 79 year old thin-appearing single Caucasian female with no children whom I last saw in the office 12 months ago. 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 Her problems include hypertension, hyperlipidemia, and moderate to severe aortic insufficiency which she is asymptomatic from. LV size and function have remained normal by 2D echo. Dr. Shelia Mcconnell follows her laboratory exams including lipid profile.   Since I saw her last she has been without complaints except for an episode of what sounds like a TIA, which she was worked up for and which resolved spontaneously. Carotid Dopplers performed in our office December 03, 2011, were essentially normal. Since I saw her back 12 months ago she's remained currently stable. A 2-D echocardiogram performed 12/10/13 revealed normal LV size and function with moderate aortic insufficiency unchanged from prior echoes. She remains asymptomatic.   Current Outpatient Prescriptions  Medication Sig Dispense Refill  . acetaminophen (TYLENOL) 325 MG tablet Take 2 tablets (650 mg total) by mouth every 4 (four) hours as needed for mild pain or moderate pain.    Marland Kitchen atorvastatin (LIPITOR) 20 MG tablet Take 20 mg by mouth daily.    . calcium carbonate (OS-CAL) 600 MG TABS Take 600 mg by mouth 2 (two) times daily.     . carvedilol (COREG) 12.5 MG tablet TAKE ONE-HALF TABLET BY MOUTH TWICE DAILY WITH A MEAL 30 tablet 10  . cholecalciferol (VITAMIN D) 400 UNITS TABS Take 400 Units by mouth daily.     . clopidogrel (PLAVIX) 75 MG tablet Take 1 tablet (75 mg total) by mouth daily. 15 tablet 0  . furosemide (LASIX) 20 MG tablet Take 20 mg by mouth daily.    Marland Kitchen losartan (COZAAR) 50 MG tablet Take 1 tablet by mouth daily.  3  .  Multiple Vitamin (MULTIVITAMIN) tablet Take 1 tablet by mouth daily.       No current facility-administered medications for this visit.    Allergies  Allergen Reactions  . Codeine Other (See Comments)    Makes me really hyper."    History   Social History  . Marital Status: Single    Spouse Name: N/A  . Number of Children: o  . Years of Education: 12   Occupational History  .  [employer]  . clerk    Social History Main Topics  . Smoking status: Never Smoker   . Smokeless tobacco: Never Used  . Alcohol Use: No  . Drug Use: No  . Sexual Activity: Not on file   Other Topics Concern  . 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     Review of Systems: General: negative for chills, fever, night sweats or weight changes.  Cardiovascular: negative for chest pain, dyspnea on exertion, edema, orthopnea, palpitations, paroxysmal nocturnal dyspnea or shortness of breath Dermatological: negative for rash Respiratory: negative for cough or wheezing Urologic: negative for hematuria Abdominal: negative for nausea, vomiting, diarrhea, bright red blood per rectum, melena, or hematemesis Neurologic: negative for visual changes, syncope, or dizziness All other systems reviewed and are otherwise negative except as noted above.    Blood pressure 142/72, pulse 68, weight 132 lb 14.4 oz (60.283 kg).  General appearance: alert and no distress  Neck: no adenopathy, no carotid bruit, no JVD, supple, symmetrical, trachea midline and thyroid not enlarged, symmetric, no tenderness/mass/nodules Lungs: clear to auscultation bilaterally Heart: regular rate and rhythm, S1, S2 normal, no murmur, click, rub or gallop Extremities: extremities normal, atraumatic, no cyanosis or edema  EKG not performed today  ASSESSMENT AND PLAN:   Moderate aortic regurgitation History of moderate aortic insufficiency with echo performed in November of last year  that showed normal LV size and function with moderate AI and change from prior echoes. She is asymptomatic. We will continue to follow this on an annual basis.   Hypertension History of hypertension blood pressure measured at 142/72. She is on carvedilol and losartan. Continue current meds at current dosing   Dyslipidemia History of hyperlipidemia on atorvastatin 20 mg a day followed by her PCP       Julia Harp MD [facility], [facility] 05/31/2014 9:36 AM

## 2014-05-31 NOTE — Patient Instructions (Signed)
Your physician has requested that you have an echocardiogram in November 2016. Echocardiography is a painless test that uses sound waves to create images of your heart. It provides your doctor with information about the size and shape of your heart and how well your heart's chambers and valves are working. This procedure takes approximately one hour. There are no restrictions for this procedure.  Dr Gwenlyn Found recommends that you schedule a follow-up appointment in 1 year. You will receive a reminder letter in the mail two months in advance. If you don't receive a letter, please call our office to schedule the follow-up appointment.

## 2014-05-31 NOTE — Assessment & Plan Note (Signed)
History of hypertension blood pressure measured at 142/72. She is on carvedilol and losartan. Continue current meds at current dosing

## 2014-06-07 ENCOUNTER — Telehealth (HOSPITAL_COMMUNITY): Payer: Self-pay | Admitting: *Deleted

## 2014-06-24 ENCOUNTER — Ambulatory Visit (HOSPITAL_COMMUNITY)
Admission: RE | Admit: 2014-06-24 | Discharge: 2014-06-24 | Disposition: A | Discharge status: Home / Self Care | Payer: 59 | Source: Ambulatory Visit | Attending: Internal Medicine | Admitting: Internal Medicine

## 2014-06-24 DIAGNOSIS — I351 Nonrheumatic aortic (valve) insufficiency: Secondary | ICD-10-CM | POA: Insufficient documentation

## 2014-06-28 ENCOUNTER — Telehealth: Payer: Self-pay | Admitting: Cardiovascular Disease

## 2014-06-28 ENCOUNTER — Other Ambulatory Visit (HOSPITAL_COMMUNITY): Payer: Self-pay | Admitting: Cardiovascular Disease

## 2014-06-28 NOTE — Telephone Encounter (Signed)
Line busy when dialed. 

## 2014-06-28 NOTE — Telephone Encounter (Signed)
Pt would like echo results from Monday please.

## 2014-06-28 NOTE — Telephone Encounter (Signed)
Na @ 4:50 pm

## 2014-07-01 NOTE — Telephone Encounter (Signed)
Advised pt results normal, pt voiced understanding. Also gave acknowledgement she received results letter in the mail.

## 2014-10-18 ENCOUNTER — Telehealth: Payer: Self-pay

## 2014-10-18 NOTE — Telephone Encounter (Signed)
Pt clearance faxed to Dr. Romona Curls   Pt contacted and made aware that paperwork was faxed

## 2014-10-18 NOTE — Telephone Encounter (Signed)
Requesting surgical clearance:   1. Type of surgery: Epidural steroid injection of the spine  2. Surgeon: Dr. Laurence Spates  3. Surgical date: pending clearance  4. Medications that need to be held: want to stop Plavix for 7 days  5. CAD: no     6. I will defer to: Dr. Darryl Nestle Orthopedics: Dr. Ernestina Patches Scheduling [phone] Fax [phone] phone

## 2014-10-18 NOTE — Telephone Encounter (Signed)
Yes, she can interrupt her Plavix but will need to be off it for at least 7 days prior to an epidural injection

## 2014-10-18 NOTE — Telephone Encounter (Signed)
Pt is calling back to see if it is all right for her be off of her Plavix for the epidural shot. She says that if she does not answer the phone to please leave a detailed message  Thanks

## 2015-05-06 ENCOUNTER — Other Ambulatory Visit (HOSPITAL_COMMUNITY): Payer: Self-pay | Admitting: Family

## 2015-05-08 ENCOUNTER — Other Ambulatory Visit: Payer: Self-pay | Admitting: Orthopedic Surgery

## 2015-05-16 ENCOUNTER — Encounter (HOSPITAL_COMMUNITY): Payer: Self-pay | Admitting: *Deleted

## 2015-05-16 ENCOUNTER — Ambulatory Visit (HOSPITAL_COMMUNITY)
Admission: EM | Admit: 2015-05-16 | Discharge: 2015-05-16 | Disposition: A | Discharge status: Home / Self Care | Payer: 59

## 2015-05-16 DIAGNOSIS — L239 Allergic contact dermatitis, unspecified cause: Secondary | ICD-10-CM

## 2015-05-16 NOTE — Discharge Instructions (Signed)
This is not shingles. Stay with cream per Dermatologist. Feel better.
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
## 2015-05-16 NOTE — ED Notes (Addendum)
Pt  Has  A   Rash  That  Developed  This  Week  She  Reports  She  Just   Saw  dematologist  Who  rx  Her  Some  Cream      pt  Does  Not  know   The  Name  Of  The cream  And  Did  Not  Bring it

## 2015-05-23 ENCOUNTER — Other Ambulatory Visit (HOSPITAL_COMMUNITY): Payer: Self-pay | Admitting: *Deleted

## 2015-05-23 ENCOUNTER — Encounter (HOSPITAL_COMMUNITY): Payer: Self-pay

## 2015-05-23 ENCOUNTER — Encounter (HOSPITAL_COMMUNITY)
Admission: RE | Admit: 2015-05-23 | Discharge: 2015-05-23 | Disposition: A | Discharge status: Home / Self Care | Payer: 59 | Source: Ambulatory Visit | Attending: Orthopedic Surgery | Admitting: Orthopedic Surgery

## 2015-05-23 DIAGNOSIS — Z01810 Encounter for preprocedural cardiovascular examination: Secondary | ICD-10-CM | POA: Insufficient documentation

## 2015-05-23 DIAGNOSIS — Z01812 Encounter for preprocedural laboratory examination: Secondary | ICD-10-CM | POA: Insufficient documentation

## 2015-05-23 HISTORY — DX: Anemia, unspecified: D64.9

## 2015-05-23 HISTORY — DX: Urgency of urination: R39.15

## 2015-05-23 HISTORY — DX: Cerebral infarction, unspecified: I63.9

## 2015-05-23 LAB — CBC
HEMATOCRIT: 39.8 % (ref 36.0–46.0)
HEMOGLOBIN: 13.1 g/dL (ref 12.0–15.0)
MCH: 30.7 pg (ref 26.0–34.0)
MCHC: 32.9 g/dL (ref 30.0–36.0)
MCV: 93.2 fL (ref 78.0–100.0)
Platelets: 226 10*3/uL (ref 150–400)
RBC: 4.27 MIL/uL (ref 3.87–5.11)
RDW: 14.4 % (ref 11.5–15.5)
WBC: 4.9 10*3/uL (ref 4.0–10.5)

## 2015-05-23 LAB — BASIC METABOLIC PANEL
ANION GAP: 11 (ref 5–15)
BUN: 16 mg/dL (ref 6–20)
CHLORIDE: 100 mmol/L — AB (ref 101–111)
CO2: 24 mmol/L (ref 22–32)
Calcium: 9.5 mg/dL (ref 8.9–10.3)
Creatinine, Ser: 0.73 mg/dL (ref 0.44–1.00)
GFR calc Af Amer: >60 mL/min (ref >60)
GLUCOSE: 222 mg/dL — AB (ref 65–99)
POTASSIUM: 4.4 mmol/L (ref 3.5–5.1)
SODIUM: 135 mmol/L (ref 135–145)

## 2015-05-23 LAB — SURGICAL PCR SCREEN
MRSA, PCR: NEGATIVE
Staphylococcus aureus: NEGATIVE

## 2015-05-23 NOTE — Progress Notes (Signed)
Dr. Jess Barters office called to see when pt. Needs to stop plavix.  They will call the patient @ home to let her know.

## 2015-05-23 NOTE — Pre-Procedure Instructions (Signed)
    MIRA ROMIG  05/23/2015      [facility] [place], [place] - [address] [place] [address] Phone: [phone] Fax: [phone]  [id number] - [place], [place] [id number] [place] [id number] Phone: [phone] Fax: [phone]    Your procedure is scheduled on May 24,2017    Wednesday   Report to [facility] Admitting at 6:30 AM      Call this number if you have problems the morning of surgery:  [phone]   Remember:  Do not eat food or drink liquids after midnight.   Take these medicines the morning of surgery with A SIP OF WATER Tylenol,Amlodipine,Carvedilol(Coreg)              Stop Aspirin,Ibuprofen,Advil,motringoody powders, all herbal supplements and vitamin 5 days prior to surgery              Follow  physician instructions  On when to stop plavix         Do not wear jewelry, make-up or nail polish.  Do not wear lotions, powders, or perfumes.  You may NOT wear deodorant.  Do not shave 48 hours prior to surgery.   .  Do not bring valuables to the hospital.  [facility] is not responsible for any belongings or valuables.  Contacts, dentures or bridgework may not be worn into surgery.  Leave your suitcase in the car.  After surgery it may be brought to your room.  For patients admitted to the hospital, discharge time will be determined by your treatment team.  Patients discharged the day of surgery will not be allowed to drive home.    Special instructions:  See attached Sheet for instructions on CHG showers  Please read over the following fact sheets that you were given. Pain Booklet and Coughing and Deep Breathing

## 2015-05-23 NOTE — Progress Notes (Signed)
Pt. Has appointment with Dr.Berry on 05-30-2015.Check Epic for notes.

## 2015-05-30 ENCOUNTER — Encounter: Payer: Self-pay | Admitting: Cardiovascular Disease

## 2015-05-30 ENCOUNTER — Ambulatory Visit (INDEPENDENT_AMBULATORY_CARE_PROVIDER_SITE_OTHER): Payer: 59 | Admitting: Cardiovascular Disease

## 2015-05-30 VITALS — BP 122/80 | HR 62 | Ht <= 58 in | Wt 132.6 lb

## 2015-05-30 DIAGNOSIS — I10 Essential (primary) hypertension: Secondary | ICD-10-CM

## 2015-05-30 DIAGNOSIS — I351 Nonrheumatic aortic (valve) insufficiency: Secondary | ICD-10-CM

## 2015-05-30 DIAGNOSIS — E785 Hyperlipidemia, unspecified: Secondary | ICD-10-CM

## 2015-05-30 DIAGNOSIS — Z01810 Encounter for preprocedural cardiovascular examination: Secondary | ICD-10-CM

## 2015-05-30 NOTE — Patient Instructions (Signed)
Dr Gwenlyn Found has requested that you have the following tests (either Monday or Tuesday) to be cleared for surgery:  1. Echocardiogram - Your physician has requested that you have an echocardiogram now and in 1 year. Echocardiography is a painless test that uses sound waves to create images of your heart. It provides your doctor with information about the size and shape of your heart and how well your heart's chambers and valves are working. This procedure takes approximately one hour. There are no restrictions for this procedure.  2. [place] Nuclear Stress test - Your physician has requested that you have a lexiscan myoview. For further information please visit [email or web address]. Please follow instruction sheet, as given.  Dr Gwenlyn Found recommends that you schedule a follow-up appointment in 1 year. You will receive a reminder letter in the mail two months in advance. If you don't receive a letter, please call our office to schedule the follow-up appointment.  If you need a refill on your cardiac medications before your next appointment, please call your pharmacy.

## 2015-05-30 NOTE — Assessment & Plan Note (Signed)
History of hypertension blood pressure measured at 122/80. She is on amlodipine and losartan. Continue current meds at current dosing

## 2015-05-30 NOTE — Assessment & Plan Note (Signed)
history of moderate aortic insufficiency followed by annual 2-D echocardiograms. LV size and function has not changed according to her most recent 2-D echo performed 6/13/16with normal LV function and moderate aortic insufficiency.

## 2015-05-30 NOTE — Assessment & Plan Note (Signed)
History of hyperlipidemia on statin therapy followed by her PCP. 

## 2015-05-30 NOTE — Progress Notes (Signed)
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 Her problems include hypertension, hyperlipidemia, and moderate to severe aortic insufficiency which she is asymptomatic from. LV size and function have remained normal by 2D echo. Dr. Shelia Media follows her laboratory exams including lipid profile.   Since I saw her last she has been without complaints except for an episode of what sounds like a TIA, which she was worked up for and which resolved spontaneously. Carotid Dopplers performed in our office December 03, 2011, were essentially normal. Since I saw her back 12 months ago she's remained currently stable. A 2-D echocardiogram performed 06/24/14 revealed normal LV size and function with moderate aortic insufficiency unchanged from prior echoes. She remains asymptomatic. She is scheduled for a right total knee replacement next Wednesday by Dr. Sharol Given. I'm going to get a follow-up 2-D echo and pharmacologic Myoview stress test to risk stratify her.   Current Outpatient Prescriptions  Medication Sig Dispense Refill  . acetaminophen (TYLENOL) 325 MG tablet Take 2 tablets (650 mg total) by mouth every 4 (four) hours as needed for mild pain or moderate pain. (Patient taking differently: Take 650 mg by mouth 2 (two) times daily as needed for mild pain or moderate pain. )    . amLODipine (NORVASC) 5 MG tablet Take 5 mg by mouth daily.    Marland Kitchen atorvastatin (LIPITOR) 20 MG tablet Take 20 mg by mouth daily.    . betamethasone dipropionate (DIPROLENE) 0.05 % cream Apply 1 application topically 2 (two) times daily.    . calcium carbonate (OS-CAL) 600 MG TABS Take 600 mg by mouth 2  (two) times daily.     . carvedilol (COREG) 12.5 MG tablet TAKE ONE-HALF TABLET BY MOUTH TWICE DAILY WITH MEALS 30 tablet 10  . clopidogrel (PLAVIX) 75 MG tablet Take 1 tablet (75 mg total) by mouth daily. 15 tablet 0  . fluticasone (CUTIVATE) 0.05 % cream Apply 1 application topically 2 (two) times daily.    . furosemide (LASIX) 20 MG tablet Take 20 mg by mouth daily as needed for fluid.     Marland Kitchen losartan (COZAAR) 50 MG tablet Take 50 mg by mouth daily.   3  . Multiple Vitamin (MULTIVITAMIN) tablet Take 1 tablet by mouth daily.       No current facility-administered medications for this visit.    Allergies  Allergen Reactions  . Codeine Other (See Comments)    Makes me really hyper."    Social History   Social History  . Marital Status: Single    Spouse Name: N/A  . Number of Children: o  . Years of Education: 12   Occupational History  .  [employer]  . clerk    Social History Main Topics  . Smoking status: Never Smoker   . Smokeless tobacco: Never Used  . Alcohol Use: No  . Drug Use: No  . Sexual Activity: Not on file   Other Topics Concern  . 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     Review of Systems: General: negative for chills, fever, night sweats or weight changes.  Cardiovascular: negative for chest pain, dyspnea on exertion, edema, orthopnea, palpitations, paroxysmal nocturnal dyspnea or shortness of breath Dermatological: negative for rash Respiratory: negative for cough or wheezing Urologic: negative for hematuria Abdominal: negative for nausea, vomiting, diarrhea, bright red blood per rectum, melena, or hematemesis Neurologic: negative for visual changes, syncope, or dizziness All other systems reviewed and are otherwise negative except as noted above.    Blood pressure 122/80, pulse 62, height 4\' 10"  (1.473 m), weight 132 lb 9.6 oz (60.147 kg), SpO2 96 %.  General appearance: alert and no  distress Neck: no adenopathy, no carotid bruit, no JVD, supple, symmetrical, trachea midline and thyroid not enlarged, symmetric, no tenderness/mass/nodules Lungs: clear to auscultation bilaterally Heart: regular rate and rhythm, S1, S2 normal, no murmur, click, rub or gallop Extremities: extremities normal, atraumatic, no cyanosis or edema  EKG not performed today  ASSESSMENT AND PLAN:   Hypertension History of hypertension blood pressure measured at 122/80. She is on amlodipine and losartan. Continue current meds at current dosing  Dyslipidemia History of hyperlipidemia on statin therapy followed by her PCP  Moderate aortic regurgitation history of moderate aortic insufficiency followed by annual 2-D echocardiograms. LV size and function has not changed according to her most recent 2-D echo performed 6/13/16with normal LV function and moderate aortic insufficiency.      Lorretta Harp MD FACP,FACC,FAHA, FSCAI 05/30/2015 9:00 AM

## 2015-05-31 ENCOUNTER — Other Ambulatory Visit (HOSPITAL_COMMUNITY): Payer: Self-pay | Admitting: Cardiovascular Disease

## 2015-06-02 ENCOUNTER — Encounter (HOSPITAL_COMMUNITY)
Admission: RE | Admit: 2015-06-02 | Discharge: 2015-06-02 | Disposition: A | Discharge status: Home / Self Care | Payer: 59 | Source: Ambulatory Visit | Attending: Cardiovascular Disease | Admitting: Cardiovascular Disease

## 2015-06-02 ENCOUNTER — Encounter (HOSPITAL_BASED_OUTPATIENT_CLINIC_OR_DEPARTMENT_OTHER)
Admission: RE | Admit: 2015-06-02 | Discharge: 2015-06-02 | Disposition: A | Discharge status: Home / Self Care | Payer: 59 | Source: Ambulatory Visit | Attending: Cardiovascular Disease | Admitting: Cardiovascular Disease

## 2015-06-02 DIAGNOSIS — Z01810 Encounter for preprocedural cardiovascular examination: Secondary | ICD-10-CM

## 2015-06-02 LAB — NM MYOCAR MULTI W/SPECT W/WALL MOTION / EF
CHL CUP NUCLEAR SDS: 7
CHL CUP RESTING HR STRESS: 63 {beats}/min
CSEPED: 0 min
CSEPEDS: 0 s
CSEPEW: 1 METS
CSEPHR: 62 %
LV dias vol: 63 mL (ref 46–106)
LV sys vol: 14 mL
MPHR: 143 {beats}/min
Peak HR: 90 {beats}/min
RATE: 0
SRS: 10
SSS: 15
TID: 1.05

## 2015-06-02 MED ORDER — TECHNETIUM TC 99M TETROFOSMIN IV KIT
30.0000 | PACK | Freq: Once | INTRAVENOUS | Status: AC | PRN
  Administered 2015-06-02: 30 via INTRAVENOUS

## 2015-06-02 MED ORDER — TECHNETIUM TC 99M TETROFOSMIN IV KIT
10.0000 | PACK | Freq: Once | INTRAVENOUS | Status: AC | PRN
  Administered 2015-06-02: 10 via INTRAVENOUS

## 2015-06-02 MED ORDER — REGADENOSON 0.4 MG/5ML IV SOLN
INTRAVENOUS | Status: AC
  Administered 2015-06-02: 0.4 mg
  Filled 2015-06-02: qty 5

## 2015-06-02 NOTE — Telephone Encounter (Signed)
Rx(s) sent to pharmacy electronically.  

## 2015-06-02 NOTE — Progress Notes (Signed)
Echo scheduled 06/03/15 Stress test scheduled 06/02/15

## 2015-06-02 NOTE — Progress Notes (Signed)
Lexiscan stress test completed without significant complication. Pending result by [place] reader  Signed, Almyra Deforest PA Pager: [phone]

## 2015-06-03 ENCOUNTER — Other Ambulatory Visit: Payer: Self-pay | Admitting: *Deleted

## 2015-06-03 ENCOUNTER — Encounter (HOSPITAL_COMMUNITY): Payer: Self-pay | Admitting: Anesthesiology

## 2015-06-03 ENCOUNTER — Ambulatory Visit (HOSPITAL_BASED_OUTPATIENT_CLINIC_OR_DEPARTMENT_OTHER)
Admission: RE | Admit: 2015-06-03 | Discharge: 2015-06-03 | Disposition: A | Discharge status: Home / Self Care | Payer: 59 | Source: Ambulatory Visit | Attending: Cardiovascular Disease | Admitting: Cardiovascular Disease

## 2015-06-03 DIAGNOSIS — Z01810 Encounter for preprocedural cardiovascular examination: Secondary | ICD-10-CM | POA: Diagnosis not present

## 2015-06-03 DIAGNOSIS — Z8673 Personal history of transient ischemic attack (TIA), and cerebral infarction without residual deficits: Secondary | ICD-10-CM | POA: Insufficient documentation

## 2015-06-03 DIAGNOSIS — I10 Essential (primary) hypertension: Secondary | ICD-10-CM

## 2015-06-03 DIAGNOSIS — I351 Nonrheumatic aortic (valve) insufficiency: Secondary | ICD-10-CM

## 2015-06-03 MED ORDER — CEFAZOLIN SODIUM-DEXTROSE 2-4 GM/100ML-% IV SOLN
2.0000 g | INTRAVENOUS | Status: AC
  Administered 2015-06-04: 2 g via INTRAVENOUS
  Filled 2015-06-03: qty 100
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
## 2015-06-03 NOTE — Anesthesia Preprocedure Evaluation (Addendum)
Anesthesia Evaluation  Patient identified by MRN, date of birth, ID band Patient awake    Reviewed: Allergy & Precautions, NPO status , Patient's Chart, lab work & pertinent test results, reviewed documented beta blocker date and time   Airway Mallampati: II  TM Distance: >3 FB Neck ROM: Full    Dental no notable dental hx. (+) Caps, Partial Lower   Pulmonary sleep apnea and Continuous Positive Airway Pressure Ventilation ,    Pulmonary exam normal breath sounds clear to auscultation       Cardiovascular hypertension, Pt. on medications and Pt. on home beta blockers + Peripheral Vascular Disease  Normal cardiovascular exam+ Valvular Problems/Murmurs AI  Rhythm:Regular Rate:Normal + Diastolic murmurs- Systolic murmurs LVEF [id number] Moderate AI Grade 2 diastolic dysfunction Nuclear stress test - no evidence of reversible ischemia Fixed defect  Apical and apical/septal Low risk   Neuro/Psych TIACVA, Residual Symptoms negative psych ROS   GI/Hepatic negative GI ROS, Neg liver ROS,   Endo/Other  Hyperglycemia Hyperlipidemia  Renal/GU negative Renal ROS Bladder dysfunction  Urinary frequency    Musculoskeletal  (+) Arthritis , Osteoarthritis,  Osteoporosis Chronic LBP   Abdominal   Peds  Hematology negative hematology ROS (+)   Anesthesia Other Findings   Reproductive/Obstetrics                         Anesthesia Physical Anesthesia Plan  ASA: III  Anesthesia Plan: General and Regional   Post-op Pain Management: GA combined w/ Regional for post-op pain   Induction: Intravenous  Airway Management Planned: Oral ETT  Additional Equipment:   Intra-op Plan:   Post-operative Plan: Extubation in OR  Informed Consent: I have reviewed the patients History and Physical, chart, labs and discussed the procedure including the risks, benefits and alternatives for the proposed anesthesia with the  patient or authorized representative who has indicated his/her understanding and acceptance.   Dental advisory given  Plan Discussed with: CRNA, Anesthesiologist and Surgeon  Anesthesia Plan Comments:        Anesthesia Quick Evaluation

## 2015-06-03 NOTE — Progress Notes (Signed)
Echocardiogram 2D Echocardiogram has been performed.  Tresa Res 06/03/2015, 11:00 AM

## 2015-06-04 ENCOUNTER — Other Ambulatory Visit: Payer: Self-pay | Admitting: Cardiology

## 2015-06-04 ENCOUNTER — Encounter (HOSPITAL_COMMUNITY)
Admission: RE | Disposition: A | Discharge status: Skilled Nursing Facility | Payer: Self-pay | Source: Ambulatory Visit | Attending: Orthopedic Surgery

## 2015-06-04 ENCOUNTER — Inpatient Hospital Stay (HOSPITAL_COMMUNITY): Payer: 59 | Admitting: Vascular Surgery

## 2015-06-04 ENCOUNTER — Inpatient Hospital Stay (HOSPITAL_COMMUNITY)
Admission: RE | Admit: 2015-06-04 | Discharge: 2015-06-06 | DRG: 470 | Disposition: A | Discharge status: Skilled Nursing Facility | Payer: 59 | Source: Ambulatory Visit | Attending: Orthopedic Surgery | Admitting: Orthopedic Surgery

## 2015-06-04 ENCOUNTER — Inpatient Hospital Stay (HOSPITAL_COMMUNITY): Payer: 59 | Admitting: Anesthesiology

## 2015-06-04 ENCOUNTER — Encounter (HOSPITAL_COMMUNITY): Payer: Self-pay | Admitting: *Deleted

## 2015-06-04 DIAGNOSIS — Z885 Allergy status to narcotic agent status: Secondary | ICD-10-CM

## 2015-06-04 DIAGNOSIS — I351 Nonrheumatic aortic (valve) insufficiency: Secondary | ICD-10-CM | POA: Diagnosis present

## 2015-06-04 DIAGNOSIS — Z808 Family history of malignant neoplasm of other organs or systems: Secondary | ICD-10-CM | POA: Diagnosis not present

## 2015-06-04 DIAGNOSIS — M545 Low back pain: Secondary | ICD-10-CM | POA: Diagnosis present

## 2015-06-04 DIAGNOSIS — G4733 Obstructive sleep apnea (adult) (pediatric): Secondary | ICD-10-CM | POA: Diagnosis present

## 2015-06-04 DIAGNOSIS — I739 Peripheral vascular disease, unspecified: Secondary | ICD-10-CM | POA: Diagnosis present

## 2015-06-04 DIAGNOSIS — G8929 Other chronic pain: Secondary | ICD-10-CM | POA: Diagnosis present

## 2015-06-04 DIAGNOSIS — Z833 Family history of diabetes mellitus: Secondary | ICD-10-CM | POA: Diagnosis not present

## 2015-06-04 DIAGNOSIS — Z823 Family history of stroke: Secondary | ICD-10-CM

## 2015-06-04 DIAGNOSIS — Z803 Family history of malignant neoplasm of breast: Secondary | ICD-10-CM | POA: Diagnosis not present

## 2015-06-04 DIAGNOSIS — I10 Essential (primary) hypertension: Secondary | ICD-10-CM | POA: Diagnosis present

## 2015-06-04 DIAGNOSIS — M1712 Unilateral primary osteoarthritis, left knee: Secondary | ICD-10-CM | POA: Diagnosis present

## 2015-06-04 DIAGNOSIS — M810 Age-related osteoporosis without current pathological fracture: Secondary | ICD-10-CM | POA: Diagnosis present

## 2015-06-04 DIAGNOSIS — Z96659 Presence of unspecified artificial knee joint: Secondary | ICD-10-CM

## 2015-06-04 DIAGNOSIS — Z96652 Presence of left artificial knee joint: Secondary | ICD-10-CM

## 2015-06-04 DIAGNOSIS — E785 Hyperlipidemia, unspecified: Secondary | ICD-10-CM | POA: Diagnosis present

## 2015-06-04 DIAGNOSIS — M179 Osteoarthritis of knee, unspecified: Secondary | ICD-10-CM | POA: Diagnosis not present

## 2015-06-04 DIAGNOSIS — Z8673 Personal history of transient ischemic attack (TIA), and cerebral infarction without residual deficits: Secondary | ICD-10-CM

## 2015-06-04 DIAGNOSIS — G8918 Other acute postprocedural pain: Secondary | ICD-10-CM | POA: Diagnosis not present

## 2015-06-04 HISTORY — PX: TOTAL KNEE ARTHROPLASTY: SHX125

## 2015-06-04 SURGERY — ARTHROPLASTY, KNEE, TOTAL
Anesthesia: Regional | Site: Knee | Laterality: Left

## 2015-06-04 MED ORDER — SUCCINYLCHOLINE CHLORIDE 200 MG/10ML IV SOSY
PREFILLED_SYRINGE | INTRAVENOUS | Status: AC
  Filled 2015-06-04: qty 10

## 2015-06-04 MED ORDER — ONDANSETRON HCL 4 MG/2ML IJ SOLN
INTRAMUSCULAR | Status: AC
  Filled 2015-06-04: qty 2

## 2015-06-04 MED ORDER — ONDANSETRON HCL 4 MG PO TABS: 4.0000 mg | ORAL_TABLET | Freq: Four times a day (QID) | ORAL | Status: DC | PRN

## 2015-06-04 MED ORDER — CHLORHEXIDINE GLUCONATE 4 % EX LIQD: 60.0000 mL | Freq: Once | CUTANEOUS | Status: DC

## 2015-06-04 MED ORDER — DOCUSATE SODIUM 100 MG PO CAPS
100.0000 mg | ORAL_CAPSULE | Freq: Two times a day (BID) | ORAL | Status: DC
  Administered 2015-06-04 – 2015-06-06 (×5): 100 mg via ORAL
  Filled 2015-06-04 (×5): qty 1

## 2015-06-04 MED ORDER — EPHEDRINE SULFATE 50 MG/ML IJ SOLN
INTRAMUSCULAR | Status: DC | PRN
  Administered 2015-06-04: 10 mg via INTRAVENOUS

## 2015-06-04 MED ORDER — SUCCINYLCHOLINE CHLORIDE 20 MG/ML IJ SOLN
INTRAMUSCULAR | Status: DC | PRN
  Administered 2015-06-04: 100 mg via INTRAVENOUS

## 2015-06-04 MED ORDER — METHOCARBAMOL 500 MG PO TABS: 500.0000 mg | ORAL_TABLET | Freq: Four times a day (QID) | ORAL | Status: DC | PRN

## 2015-06-04 MED ORDER — POLYETHYLENE GLYCOL 3350 17 G PO PACK: 17.0000 g | PACK | Freq: Every day | ORAL | Status: DC | PRN

## 2015-06-04 MED ORDER — ONDANSETRON HCL 4 MG/2ML IJ SOLN: 4.0000 mg | Freq: Four times a day (QID) | INTRAMUSCULAR | Status: DC | PRN

## 2015-06-04 MED ORDER — ROCURONIUM BROMIDE 100 MG/10ML IV SOLN
INTRAVENOUS | Status: DC | PRN
  Administered 2015-06-04: 30 mg via INTRAVENOUS

## 2015-06-04 MED ORDER — HYDROMORPHONE HCL 1 MG/ML IJ SOLN
INTRAMUSCULAR | Status: AC
  Administered 2015-06-04: 0.25 mg via INTRAVENOUS
  Filled 2015-06-04: qty 1

## 2015-06-04 MED ORDER — FENTANYL CITRATE (PF) 250 MCG/5ML IJ SOLN
INTRAMUSCULAR | Status: AC
  Filled 2015-06-04: qty 5

## 2015-06-04 MED ORDER — MENTHOL 3 MG MT LOZG: 1.0000 | LOZENGE | OROMUCOSAL | Status: DC | PRN

## 2015-06-04 MED ORDER — LOSARTAN POTASSIUM 50 MG PO TABS
50.0000 mg | ORAL_TABLET | Freq: Every day | ORAL | Status: DC
  Administered 2015-06-04 – 2015-06-06 (×3): 50 mg via ORAL
  Filled 2015-06-04 (×3): qty 1

## 2015-06-04 MED ORDER — NALOXONE HCL 0.4 MG/ML IJ SOLN
INTRAMUSCULAR | Status: AC
  Filled 2015-06-04: qty 1

## 2015-06-04 MED ORDER — BUPIVACAINE-EPINEPHRINE (PF) 0.5% -1:200000 IJ SOLN
INTRAMUSCULAR | Status: DC | PRN
  Administered 2015-06-04: 20 mL via PERINEURAL

## 2015-06-04 MED ORDER — PROPOFOL 10 MG/ML IV BOLUS
INTRAVENOUS | Status: AC
  Filled 2015-06-04: qty 20

## 2015-06-04 MED ORDER — LIDOCAINE HCL (CARDIAC) 20 MG/ML IV SOLN
INTRAVENOUS | Status: DC | PRN
  Administered 2015-06-04: 100 mg via INTRAVENOUS

## 2015-06-04 MED ORDER — MAGNESIUM CITRATE PO SOLN
1.0000 | Freq: Once | ORAL | Status: DC | PRN
Start: 2015-06-04 — End: 2015-06-06

## 2015-06-04 MED ORDER — BISACODYL 10 MG RE SUPP: 10.0000 mg | Freq: Every day | RECTAL | Status: DC | PRN

## 2015-06-04 MED ORDER — HYDROMORPHONE HCL 1 MG/ML IJ SOLN
0.2500 mg | INTRAMUSCULAR | Status: DC | PRN
  Administered 2015-06-04 (×2): 0.25 mg via INTRAVENOUS
  Administered 2015-06-04: 0.5 mg via INTRAVENOUS

## 2015-06-04 MED ORDER — OXYCODONE HCL 5 MG PO TABS
5.0000 mg | ORAL_TABLET | ORAL | Status: DC | PRN
  Administered 2015-06-05 – 2015-06-06 (×5): 10 mg via ORAL
  Filled 2015-06-04 (×5): qty 2

## 2015-06-04 MED ORDER — ROCURONIUM BROMIDE 50 MG/5ML IV SOLN
INTRAVENOUS | Status: AC
  Filled 2015-06-04: qty 1

## 2015-06-04 MED ORDER — METOCLOPRAMIDE HCL 5 MG/ML IJ SOLN: 5.0000 mg | Freq: Three times a day (TID) | INTRAMUSCULAR | Status: DC | PRN

## 2015-06-04 MED ORDER — DIPHENHYDRAMINE HCL 12.5 MG/5ML PO ELIX: 12.5000 mg | ORAL_SOLUTION | ORAL | Status: DC | PRN

## 2015-06-04 MED ORDER — CLOPIDOGREL BISULFATE 75 MG PO TABS
75.0000 mg | ORAL_TABLET | Freq: Every day | ORAL | Status: DC
  Administered 2015-06-04 – 2015-06-06 (×3): 75 mg via ORAL
  Filled 2015-06-04 (×3): qty 1

## 2015-06-04 MED ORDER — DEXAMETHASONE SODIUM PHOSPHATE 10 MG/ML IJ SOLN
INTRAMUSCULAR | Status: AC
  Filled 2015-06-04: qty 1

## 2015-06-04 MED ORDER — PROPOFOL 10 MG/ML IV BOLUS
INTRAVENOUS | Status: DC | PRN
  Administered 2015-06-04: 150 mg via INTRAVENOUS

## 2015-06-04 MED ORDER — BUPIVACAINE LIPOSOME 1.3 % IJ SUSP
INTRAMUSCULAR | Status: DC | PRN
  Administered 2015-06-04: 20 mL

## 2015-06-04 MED ORDER — EPHEDRINE 5 MG/ML INJ
INTRAVENOUS | Status: AC
  Filled 2015-06-04: qty 10

## 2015-06-04 MED ORDER — AMLODIPINE BESYLATE 5 MG PO TABS
5.0000 mg | ORAL_TABLET | Freq: Every day | ORAL | Status: DC
  Administered 2015-06-05 – 2015-06-06 (×2): 5 mg via ORAL
  Filled 2015-06-04 (×2): qty 1

## 2015-06-04 MED ORDER — PHENOL 1.4 % MT LIQD: 1.0000 | OROMUCOSAL | Status: DC | PRN

## 2015-06-04 MED ORDER — ATORVASTATIN CALCIUM 20 MG PO TABS
20.0000 mg | ORAL_TABLET | Freq: Every day | ORAL | Status: DC
  Administered 2015-06-04 – 2015-06-05 (×2): 20 mg via ORAL
  Filled 2015-06-04 (×2): qty 1

## 2015-06-04 MED ORDER — SODIUM CHLORIDE 0.9 % IV SOLN
INTRAVENOUS | Status: DC
  Administered 2015-06-04 – 2015-06-05 (×2): via INTRAVENOUS

## 2015-06-04 MED ORDER — NALOXONE HCL 0.4 MG/ML IJ SOLN
INTRAMUSCULAR | Status: DC | PRN
  Administered 2015-06-04: .08 mg via INTRAVENOUS

## 2015-06-04 MED ORDER — ONDANSETRON HCL 4 MG/2ML IJ SOLN
INTRAMUSCULAR | Status: DC | PRN
  Administered 2015-06-04: 4 mg via INTRAVENOUS

## 2015-06-04 MED ORDER — LACTATED RINGERS IV SOLN
INTRAVENOUS | Status: DC | PRN
  Administered 2015-06-04 (×2): via INTRAVENOUS

## 2015-06-04 MED ORDER — ACETAMINOPHEN 650 MG RE SUPP: 650.0000 mg | Freq: Four times a day (QID) | RECTAL | Status: DC | PRN

## 2015-06-04 MED ORDER — KETOROLAC TROMETHAMINE 15 MG/ML IJ SOLN
7.5000 mg | Freq: Four times a day (QID) | INTRAMUSCULAR | Status: AC
  Administered 2015-06-04 – 2015-06-05 (×4): 7.5 mg via INTRAVENOUS
  Filled 2015-06-04 (×4): qty 1

## 2015-06-04 MED ORDER — TRANEXAMIC ACID 1000 MG/10ML IV SOLN
2000.0000 mg | INTRAVENOUS | Status: DC
  Filled 2015-06-04: qty 20

## 2015-06-04 MED ORDER — MEPERIDINE HCL 25 MG/ML IJ SOLN: 6.2500 mg | INTRAMUSCULAR | Status: DC | PRN

## 2015-06-04 MED ORDER — METOCLOPRAMIDE HCL 5 MG/ML IJ SOLN: 10.0000 mg | Freq: Once | INTRAMUSCULAR | Status: DC | PRN

## 2015-06-04 MED ORDER — LIDOCAINE 2% (20 MG/ML) 5 ML SYRINGE
INTRAMUSCULAR | Status: AC
  Filled 2015-06-04: qty 5

## 2015-06-04 MED ORDER — SODIUM CHLORIDE 0.9 % IR SOLN
Status: DC | PRN
  Administered 2015-06-04 (×2): 1000 mL

## 2015-06-04 MED ORDER — CARVEDILOL 12.5 MG PO TABS
12.5000 mg | ORAL_TABLET | Freq: Two times a day (BID) | ORAL | Status: DC
  Administered 2015-06-04 – 2015-06-06 (×4): 12.5 mg via ORAL
  Filled 2015-06-04 (×4): qty 1

## 2015-06-04 MED ORDER — FENTANYL CITRATE (PF) 100 MCG/2ML IJ SOLN
INTRAMUSCULAR | Status: DC | PRN
  Administered 2015-06-04 (×3): 50 ug via INTRAVENOUS

## 2015-06-04 MED ORDER — BUPIVACAINE LIPOSOME 1.3 % IJ SUSP
20.0000 mL | INTRAMUSCULAR | Status: DC
  Filled 2015-06-04: qty 20

## 2015-06-04 MED ORDER — FUROSEMIDE 20 MG PO TABS: 20.0000 mg | ORAL_TABLET | Freq: Every day | ORAL | Status: DC | PRN

## 2015-06-04 MED ORDER — METOCLOPRAMIDE HCL 5 MG PO TABS: 5.0000 mg | ORAL_TABLET | Freq: Three times a day (TID) | ORAL | Status: DC | PRN

## 2015-06-04 MED ORDER — 0.9 % SODIUM CHLORIDE (POUR BTL) OPTIME
TOPICAL | Status: DC | PRN
  Administered 2015-06-04: 1000 mL

## 2015-06-04 MED ORDER — TRANEXAMIC ACID 1000 MG/10ML IV SOLN
2000.0000 mg | INTRAVENOUS | Status: DC | PRN
  Administered 2015-06-04: 2000 mg via TOPICAL

## 2015-06-04 MED ORDER — SUGAMMADEX SODIUM 200 MG/2ML IV SOLN
INTRAVENOUS | Status: AC
  Filled 2015-06-04: qty 2

## 2015-06-04 MED ORDER — CEFAZOLIN SODIUM 1-5 GM-% IV SOLN
1.0000 g | Freq: Four times a day (QID) | INTRAVENOUS | Status: AC
  Administered 2015-06-04 (×2): 1 g via INTRAVENOUS
  Filled 2015-06-04 (×3): qty 50

## 2015-06-04 MED ORDER — ALUM & MAG HYDROXIDE-SIMETH 200-200-20 MG/5ML PO SUSP: 30.0000 mL | ORAL | Status: DC | PRN

## 2015-06-04 MED ORDER — MIDAZOLAM HCL 2 MG/2ML IJ SOLN
INTRAMUSCULAR | Status: AC
  Filled 2015-06-04: qty 2

## 2015-06-04 MED ORDER — DEXAMETHASONE SODIUM PHOSPHATE 10 MG/ML IJ SOLN
INTRAMUSCULAR | Status: DC | PRN
  Administered 2015-06-04: 10 mg via INTRAVENOUS

## 2015-06-04 MED ORDER — METHOCARBAMOL 1000 MG/10ML IJ SOLN
500.0000 mg | Freq: Four times a day (QID) | INTRAVENOUS | Status: DC | PRN
  Filled 2015-06-04: qty 5

## 2015-06-04 MED ORDER — HYDROMORPHONE HCL 1 MG/ML IJ SOLN: 0.5000 mg | INTRAMUSCULAR | Status: DC | PRN

## 2015-06-04 MED ORDER — ACETAMINOPHEN 325 MG PO TABS: 650.0000 mg | ORAL_TABLET | Freq: Four times a day (QID) | ORAL | Status: DC | PRN

## 2015-06-04 MED ORDER — SUGAMMADEX SODIUM 200 MG/2ML IV SOLN
INTRAVENOUS | Status: DC | PRN
  Administered 2015-06-04: 100 mg via INTRAVENOUS

## 2015-06-04 SURGICAL SUPPLY — 56 items
BAG DECANTER FOR FLEXI CONT (MISCELLANEOUS) ×2 IMPLANT
BLADE SAG 18X100X1.27 (BLADE) ×3 IMPLANT
BLADE SAGITTAL 25.0X1.27X90 (BLADE) ×2 IMPLANT
BLADE SAGITTAL 25.0X1.27X90MM (BLADE) ×1
BLADE SAW SGTL 13X75X1.27 (BLADE) ×2 IMPLANT
BLADE SURG 21 STRL SS (BLADE) ×6 IMPLANT
BNDG COHESIVE 6X5 TAN STRL LF (GAUZE/BANDAGES/DRESSINGS) ×4 IMPLANT
BNDG GAUZE ELAST 4 BULKY (GAUZE/BANDAGES/DRESSINGS) ×2 IMPLANT
BONE CEMENT PALACOSE (Orthopedic Implant) ×6 IMPLANT
BOWL SMART MIX CTS (DISPOSABLE) ×3 IMPLANT
CAP KNEE TOTAL 3 SIGMA ×2 IMPLANT
CEMENT BONE PALACOSE (Orthopedic Implant) ×2 IMPLANT
COVER SURGICAL LIGHT HANDLE (MISCELLANEOUS) ×6 IMPLANT
CUFF TOURNIQUET SINGLE 34IN LL (TOURNIQUET CUFF) ×3 IMPLANT
CUFF TOURNIQUET SINGLE 44IN (TOURNIQUET CUFF) IMPLANT
DRAPE EXTREMITY T 121X128X90 (DRAPE) ×3 IMPLANT
DRAPE PROXIMA HALF (DRAPES) ×3 IMPLANT
DRAPE U-SHAPE 47X51 STRL (DRAPES) ×3 IMPLANT
DRSG ADAPTIC 3X8 NADH LF (GAUZE/BANDAGES/DRESSINGS) ×3 IMPLANT
DRSG PAD ABDOMINAL 8X10 ST (GAUZE/BANDAGES/DRESSINGS) ×3 IMPLANT
DURAPREP 26ML APPLICATOR (WOUND CARE) ×3 IMPLANT
ELECT REM PT RETURN 9FT ADLT (ELECTROSURGICAL) ×3
ELECTRODE REM PT RTRN 9FT ADLT (ELECTROSURGICAL) ×1 IMPLANT
FACESHIELD WRAPAROUND (MASK) ×3 IMPLANT
FACESHIELD WRAPAROUND OR TEAM (MASK) ×1 IMPLANT
GAUZE SPONGE 4X4 12PLY STRL (GAUZE/BANDAGES/DRESSINGS) ×3 IMPLANT
GLOVE BIOGEL PI IND STRL 9 (GLOVE) ×1 IMPLANT
GLOVE BIOGEL PI INDICATOR 9 (GLOVE) ×2
GLOVE SURG ORTHO 9.0 STRL STRW (GLOVE) ×3 IMPLANT
GOWN STRL REUS W/ TWL XL LVL3 (GOWN DISPOSABLE) ×2 IMPLANT
GOWN STRL REUS W/TWL XL LVL3 (GOWN DISPOSABLE) ×6
HANDPIECE INTERPULSE COAX TIP (DISPOSABLE) ×3
KIT BASIN OR (CUSTOM PROCEDURE TRAY) ×3 IMPLANT
KIT ROOM TURNOVER OR (KITS) ×3 IMPLANT
MANIFOLD NEPTUNE II (INSTRUMENTS) ×3 IMPLANT
NDL SPNL 18GX3.5 QUINCKE PK (NEEDLE) ×1 IMPLANT
NEEDLE SPNL 18GX3.5 QUINCKE PK (NEEDLE) ×3 IMPLANT
NS IRRIG 1000ML POUR BTL (IV SOLUTION) ×3 IMPLANT
PACK TOTAL JOINT (CUSTOM PROCEDURE TRAY) ×3 IMPLANT
PACK UNIVERSAL I (CUSTOM PROCEDURE TRAY) ×3 IMPLANT
PAD ARMBOARD 7.5X6 YLW CONV (MISCELLANEOUS) ×3 IMPLANT
PADDING CAST COTTON 6X4 STRL (CAST SUPPLIES) ×3 IMPLANT
SET HNDPC FAN SPRY TIP SCT (DISPOSABLE) ×1 IMPLANT
SPONGE GAUZE 4X4 12PLY STER LF (GAUZE/BANDAGES/DRESSINGS) ×2 IMPLANT
STAPLER VISISTAT 35W (STAPLE) ×3 IMPLANT
SUCTION FRAZIER HANDLE 10FR (MISCELLANEOUS)
SUCTION TUBE FRAZIER 10FR DISP (MISCELLANEOUS) IMPLANT
SUT VIC AB 0 CT1 27 (SUTURE) ×6
SUT VIC AB 0 CT1 27XBRD ANBCTR (SUTURE) ×1 IMPLANT
SUT VIC AB 1 CTX 36 (SUTURE)
SUT VIC AB 1 CTX36XBRD ANBCTR (SUTURE) IMPLANT
SYR 50ML LL SCALE MARK (SYRINGE) ×3 IMPLANT
TOWEL OR 17X24 6PK STRL BLUE (TOWEL DISPOSABLE) ×3 IMPLANT
TOWEL OR 17X26 10 PK STRL BLUE (TOWEL DISPOSABLE) ×3 IMPLANT
TRAY FOLEY CATH 16FRSI W/METER (SET/KITS/TRAYS/PACK) IMPLANT
WRAP KNEE MAXI GEL POST OP (GAUZE/BANDAGES/DRESSINGS) ×3 IMPLANT

## 2015-06-04 NOTE — Anesthesia Procedure Notes (Addendum)
Anesthesia Regional Block:  Adductor canal block  Pre-Anesthetic Checklist: ,, timeout performed, Correct Patient, Correct Site, Correct Laterality, Correct Procedure, Correct Position, site marked, Risks and benefits discussed,  Surgical consent,  Pre-op evaluation,  At surgeon's request and post-op pain management  Laterality: Left  Prep: chloraprep       Needles:  Injection technique: Single-shot  Needle Type: Echogenic Stimulator Needle     Needle Length: 9cm 9 cm Needle Gauge: 21 and 21 G  Needle insertion depth: 5 cm   Additional Needles:  Procedures: ultrasound guided (picture in chart) Adductor canal block Narrative:  Injection made incrementally with aspirations every 5 mL.  Performed by: Personally  Anesthesiologist: Josephine Igo  Additional Notes: Patient tolerated procedure well.    Procedure Name: Intubation Date/Time: 06/04/2015 8:26 AM Performed by: Lind Covert Pre-anesthesia Checklist: Patient identified, Emergency Drugs available, Suction available, Patient being monitored and Timeout performed Patient Re-evaluated:Patient Re-evaluated prior to inductionOxygen Delivery Method: Circle system utilized Preoxygenation: Pre-oxygenation with 100% oxygen Intubation Type: IV induction Ventilation: Mask ventilation without difficulty Laryngoscope Size: Mac and 3 Grade View: Grade I Tube type: Oral Tube size: 7.0 mm Number of attempts: 1 Airway Equipment and Method: Stylet Placement Confirmation: ETT inserted through vocal cords under direct vision,  positive ETCO2 and breath sounds checked- equal and bilateral Secured at: 21 cm Tube secured with: Tape Dental Injury: Teeth and Oropharynx as per pre-operative assessment       Left Adductor Canal Block

## 2015-06-04 NOTE — Anesthesia Postprocedure Evaluation (Signed)
Anesthesia Post Note  Patient: Julia Mcconnell  Procedure(s) Performed: Procedure(s) (LRB): TOTAL KNEE ARTHROPLASTY (Left)  Patient location during evaluation: PACU Anesthesia Type: General and Regional Level of consciousness: awake and alert and oriented Pain management: pain level controlled Vital Signs Assessment: post-procedure vital signs reviewed and stable Respiratory status: spontaneous breathing, nonlabored ventilation, respiratory function stable and patient connected to nasal cannula oxygen Cardiovascular status: blood pressure returned to baseline and stable Postop Assessment: no signs of nausea or vomiting Anesthetic complications: no    Last Vitals:  Filed Vitals:   06/04/15 1030 06/04/15 1045  BP: 144/57 144/95  Pulse: 69 72  Temp:    Resp: 20 24    Last Pain:  Filed Vitals:   06/04/15 1056  PainSc: 6                  Analina Filla A.

## 2015-06-04 NOTE — Op Note (Signed)
06/04/2015  9:54 AM  PATIENT:  Julia Mcconnell    PRE-OPERATIVE DIAGNOSIS:  Osteoarthritis left Knee  POST-OPERATIVE DIAGNOSIS:  Same  PROCEDURE:  TOTAL KNEE ARTHROPLASTY  SURGEON:  [name], MD  PHYSICIAN ASSISTANT:None ANESTHESIA:   General  PREOPERATIVE INDICATIONS:  Julia Mcconnell is a  80 y.o. female with a diagnosis of Osteoarthritis left Knee who failed conservative measures and elected for surgical management.    The risks benefits and alternatives were discussed with the patient preoperatively including but not limited to the risks of infection, bleeding, nerve injury, cardiopulmonary complications, the need for revision surgery, among others, and the patient was willing to proceed.  OPERATIVE IMPLANTS: [place] implants size 2 femur size 2 tibia 5 mm polyethylene tray size 29 patella  OPERATIVE FINDINGS: soft osteoporotic bone  OPERATIVE PROCEDURE: ppatient brought the operating room and underwent a general anesthetic after a abductor block. After adequate levels anesthesia obtained patient's left lower extremity was prepped using DuraPrep draped into a sterile field  Ioban was used to cover all exposed skin. A timeout was called. A midline incision was made carried down to the medial retinacular incision. Intramedullary guide was used to resect 9 mm off the distal femur with 5 of valgus. Attention was then focused on the tibia and 9 mm was resected from the tibia with 3 posterior slope neutral varus and valgus. The femur was then sized for a size 2 the chamfer and box cuts were made for the size 2 femur. The size 2 femoral trial was placed the lug cuts were drilled prior to this patient had flexion extension gaps checked and this was stable with a 5 mm tray. The 5 mm tibial tray was then placed and rotation was checked and this was marked. The lug cuts were then made for the tibia and the patella was resurfaced with lug cuts made for a size 29 patella.Trial components were  placed the patella tracked midline. The trial components removed the meniscus and loose tissue was removed. The popliteal fossa was injected with 20 mL of X Burrell. The knee was irrigated with pulsatile lavage.The  Tibial tray was inserted and impacted loose cement was removed the tibial tray was inserted the femoral component was inserted and impacted and loose cement was removed the knee was left in extension until the cement hardened the patella was clamped and loose cement was removed. After the cement hardened the knee was placed through full range of motion and patella tracked midline. Patient's knee was stable varus and valgus stress she had full extension. The retinaculum was closed using #1 Vicryl subcutaneous was closed using  0 Vicryl skin was closed using staples and sterile compressive dressing was applied. Patient was extubated taken to the PACU in stable condition.
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
## 2015-06-04 NOTE — Transfer of Care (Signed)
Immediate Anesthesia Transfer of Care Note  Patient: Julia Mcconnell  Procedure(s) Performed: Procedure(s): TOTAL KNEE ARTHROPLASTY (Left)  Patient Location: PACU  Anesthesia Type:General  Level of Consciousness: sedated  Airway & Oxygen Therapy: Patient Spontanous Breathing and Patient connected to face mask oxygen  Post-op Assessment: Report given to RN and Post -op Vital signs reviewed and stable  Post vital signs: Reviewed and stable  Last Vitals:  Filed Vitals:   06/04/15 0817 06/04/15 0818  BP:    Pulse: 61 55  Temp:    Resp: 14 12    Last Pain: There were no vitals filed for this visit.    Patients Stated Pain Goal: 1 ([id number])  Complications: No apparent anesthesia complications

## 2015-06-04 NOTE — Discharge Instructions (Signed)

## 2015-06-04 NOTE — Addendum Note (Signed)
Addendum  created 06/04/15 1111 by Lind Covert, CRNA   Modules edited: Charges VN
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
## 2015-06-05 ENCOUNTER — Encounter (HOSPITAL_COMMUNITY): Payer: Self-pay | Admitting: Orthopedic Surgery
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
## 2015-06-05 NOTE — Progress Notes (Signed)
Patient ID: Julia Mcconnell, female   DOB: 1935-08-31, 80 y.o.   MRN: [id number] Postoperative day 1 total knee arthroplasty. Patient does complain of pain this morning she has been out of the bed to a bedside commode already this morning. Plan for discharge to skilled nursing. I again discussed with the patient the osteoporosis and her follow-up with her primary care care physician regarding this.
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
## 2015-06-05 NOTE — Clinical Social Work Note (Signed)
Clinical Social Work Assessment  Patient Details  Name: Julia Mcconnell MRN: [id number] Date of Birth: 09-18-1935  Date of referral:  06/05/15               Reason for consult:  Facility Placement, Discharge Planning                Permission sought to share information with:  Chartered certified accountant granted to share information::  Yes, Verbal Permission Granted  Name::        Agency::  [facility] (preference for [employer])  Relationship::     Contact Information:     Housing/Transportation Living arrangements for the past 2 months:  [place] of Information:  Patient Patient Interpreter Needed:  None Criminal Activity/Legal Involvement Pertinent to Current Situation/Hospitalization:  No - Comment as needed Significant Relationships:  Parents Lives with:  Parents Do you feel safe going back to the place where you live?  Yes Need for family participation in patient care:  No (Coment) (Patient able to make own decisions.)  Care giving concerns:  Patient expressed no concerns at this time.   Social Worker assessment / plan:  LCSW received referral for possible SNF placement at time of discharge. LCSW met with patient at bedside to discuss discharge plan. Patient informed LCSW that patient currently resides with patient's 11 y/o mother. Patient stated patient would prefer to be discharged to [facility] at time of discharge. LCSW to continue to follow and assist with discharge planning needs.  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  Patient/Family's Understanding of and Emotional Response to Diagnosis, Current Treatment, and Prognosis:  Patient understanding and agreeable to LCSW plan of  care.  Emotional Assessment Appearance:  Appears stated age Attitude/Demeanor/Rapport:  Other (Appropriate) Affect (typically observed):  Accepting, Appropriate, Pleasant, Quiet Orientation:  Oriented to Self, Oriented to Place, Oriented to  Time, Oriented to Situation Alcohol / Substance use:  Not Applicable Psych involvement (Current and /or in the community):  No (Comment) (Not appropriate on this admission.)  Discharge Needs  Concerns to be addressed:  No discharge needs identified Readmission within the last 30 days:  No Current discharge risk:  None Barriers to Discharge:  No Barriers Identified   Caroline Sauger, LCSW 06/05/2015, 11:36 AM

## 2015-06-05 NOTE — Clinical Social Work Placement (Addendum)
   CLINICAL SOCIAL WORK PLACEMENT  NOTE  Date:  06/05/2015  Patient Details  Name: Julia Mcconnell MRN: [id number] Date of Birth: [birth year]  Clinical Social Work is seeking post-discharge placement for this patient at the [place] level of care (*CSW will initial, date and re-position this form in  chart as items are completed):  Yes   Patient/family provided with [place] Work Department's list of facilities offering this level of care within the geographic area requested by the patient (or if unable, by the patient's family).  Yes   Patient/family informed of their freedom to choose among providers that offer the needed level of care, that participate in Medicare, Medicaid or managed care program needed by the patient, have an available bed and are willing to accept the patient.  Yes   Patient/family informed of [place]'s ownership interest in [facility] and [facility], as well as of the fact that they are under no obligation to receive care at these facilities.  PASRR submitted to EDS on 06/05/15     PASRR number received on 06/05/15     Existing PASRR number confirmed on       FL2 transmitted to all facilities in geographic area requested by pt/family on 06/05/15     FL2 transmitted to all facilities within larger geographic area on       Patient informed that his/her managed care company has contracts with or will negotiate with certain facilities, including the following:          06/05/15  Patient/family informed of bed offers received.  Patient chooses bed at    [facility]   Physician recommends and patient chooses bed at      Patient to be transferred to   [facility] on  . 06/06/15  Patient to be transferred to facility by     [facility]  Patient family notified on   06/06/15 of transfer.  Name of family member notified:    pt to notify [name]    PHYSICIAN Please sign FL2     Additional Comment:     _______________________________________________ Caroline Sauger, LCSW 06/05/2015, 11:48 AM

## 2015-06-05 NOTE — Progress Notes (Signed)
OT Cancellation Note  Patient Details Name: Julia Mcconnell MRN: [id number] DOB: 06/11/1935   Cancelled Treatment:    Reason Eval/Treat Not Completed: Other (comment) Pt current D/C plan is SNF. No apparent immediate acute care OT needs, therefore will defer OT to SNF. If OT eval is needed please call Acute Rehab Dept. at [phone] or text page OT at [phone].  [place], OTR/L  [phone] 06/05/2015 06/05/2015, 8:06 AM

## 2015-06-06 MED ORDER — OXYCODONE-ACETAMINOPHEN 5-325 MG PO TABS: 1.0000 | ORAL_TABLET | ORAL | Status: DC | PRN

## 2015-06-06 NOTE — Progress Notes (Signed)
Patient ID: [name], female   DOB: [birth year], 80 y.o.   MRN: [id number] Patient making slow progress with physical therapy. F L2 is signed. Plan for discharge to skilled nursing either today or Saturday. Discharge summary and orders completed

## 2015-06-06 NOTE — Progress Notes (Signed)
Patient will discharge to [facility] Anticipated discharge date: 5/26 Family notified: pt to notify Transportation by PTAR- scheduled for 11:30am  CSW signing off.  Domenica Reamer, [place] Social Worker [phone]

## 2015-06-06 NOTE — Progress Notes (Signed)
Report given to [facility] place.

## 2015-06-06 NOTE — Discharge Summary (Signed)
Physician Discharge Summary  Patient ID: Julia Mcconnell MRN: [id number] DOB/AGE: [birth year] 80 y.o.  Admit date: 06/04/2015 Discharge date: 06/06/2015  Admission Diagnoses:Osteoarthritis left knee with osteoporosis  Discharge Diagnoses:  Active Problems:   Total knee replacement status   Discharged Condition: stable  Hospital Course: Patient's hospital course was essentially unremarkable. She underwent total knee arthroplasty postoperatively she progressed slowly and was discharged to skilled nursing in stable condition.  Consults: None  Significant Diagnostic Studies: labs: Routine labs  Treatments: surgery: See operative note  Discharge Exam: Blood pressure 141/47, pulse 65, temperature 97.9 F (36.6 C), temperature source Oral, resp. rate 16, weight 59.875 kg (132 lb), SpO2 96 %. Incision/Wound: Dressing clean and dry  Disposition: 01-Home or Self Care     Medication List    ASK your doctor about these medications        acetaminophen 325 MG tablet  Commonly known as:  TYLENOL  Take 2 tablets (650 mg total) by mouth every 4 (four) hours as needed for mild pain or moderate pain.     amLODipine 5 MG tablet  Commonly known as:  NORVASC  Take 5 mg by mouth daily.     atorvastatin 20 MG tablet  Commonly known as:  LIPITOR  Take 20 mg by mouth daily.     betamethasone dipropionate 0.05 % cream  Commonly known as:  DIPROLENE  Apply 1 application topically 2 (two) times daily.     calcium carbonate 600 MG Tabs tablet  Commonly known as:  OS-CAL  Take 600 mg by mouth 2 (two) times daily.     carvedilol 12.5 MG tablet  Commonly known as:  COREG  TAKE ONE-HALF TABLET BY MOUTH TWICE DAILY WITH MEALS     clopidogrel 75 MG tablet  Commonly known as:  PLAVIX  Take 1 tablet (75 mg total) by mouth daily.     fluticasone 0.05 % cream  Commonly known as:  CUTIVATE  Apply 1 application topically 2 (two) times daily.     furosemide 20 MG tablet  Commonly known as:   LASIX  Take 20 mg by mouth daily as needed for fluid.     losartan 50 MG tablet  Commonly known as:  COZAAR  Take 50 mg by mouth daily.     multivitamin tablet  Take 1 tablet by mouth daily.           Follow-up Information    Follow up with Copper Basnett V, MD In 2 weeks.   Specialty:  Orthopedic Surgery   Contact information:   [place] [address] [phone]       Signed: [name] 06/06/2015, 6:14 AM

## 2015-06-10 ENCOUNTER — Ambulatory Visit: Payer: Medicare Other | Admitting: Cardiovascular Disease

## 2015-06-10 ENCOUNTER — Encounter: Payer: Self-pay | Admitting: Internal Medicine

## 2015-06-10 ENCOUNTER — Non-Acute Institutional Stay (SKILLED_NURSING_FACILITY): Payer: 59 | Admitting: Internal Medicine

## 2015-06-10 DIAGNOSIS — I10 Essential (primary) hypertension: Secondary | ICD-10-CM

## 2015-06-10 DIAGNOSIS — R600 Localized edema: Secondary | ICD-10-CM

## 2015-06-10 DIAGNOSIS — M1712 Unilateral primary osteoarthritis, left knee: Secondary | ICD-10-CM

## 2015-06-10 DIAGNOSIS — G459 Transient cerebral ischemic attack, unspecified: Secondary | ICD-10-CM | POA: Diagnosis not present

## 2015-06-10 DIAGNOSIS — R2681 Unsteadiness on feet: Secondary | ICD-10-CM | POA: Diagnosis not present

## 2015-06-10 DIAGNOSIS — M810 Age-related osteoporosis without current pathological fracture: Secondary | ICD-10-CM

## 2015-06-10 DIAGNOSIS — E785 Hyperlipidemia, unspecified: Secondary | ICD-10-CM
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
## 2015-06-24 ENCOUNTER — Non-Acute Institutional Stay (SKILLED_NURSING_FACILITY): Payer: 59 | Admitting: Family

## 2015-06-24 DIAGNOSIS — I739 Peripheral vascular disease, unspecified: Secondary | ICD-10-CM

## 2015-06-24 DIAGNOSIS — E785 Hyperlipidemia, unspecified: Secondary | ICD-10-CM

## 2015-06-24 DIAGNOSIS — I10 Essential (primary) hypertension: Secondary | ICD-10-CM | POA: Diagnosis not present

## 2015-06-24 DIAGNOSIS — R269 Unspecified abnormalities of gait and mobility: Secondary | ICD-10-CM

## 2015-06-24 DIAGNOSIS — Z96652 Presence of left artificial knee joint: Secondary | ICD-10-CM | POA: Diagnosis not present
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
## 2015-06-25 ENCOUNTER — Encounter: Payer: Self-pay | Admitting: Anesthesiology

## 2015-11-10 ENCOUNTER — Telehealth (INDEPENDENT_AMBULATORY_CARE_PROVIDER_SITE_OTHER): Payer: Self-pay | Admitting: Physical Medicine and Rehabilitation

## 2015-11-10 ENCOUNTER — Ambulatory Visit (INDEPENDENT_AMBULATORY_CARE_PROVIDER_SITE_OTHER): Payer: 59 | Admitting: Family

## 2015-11-10 VITALS — Ht <= 58 in | Wt 120.0 lb

## 2015-11-10 DIAGNOSIS — G8929 Other chronic pain: Secondary | ICD-10-CM | POA: Diagnosis not present

## 2015-11-10 DIAGNOSIS — M1711 Unilateral primary osteoarthritis, right knee: Secondary | ICD-10-CM

## 2015-11-10 DIAGNOSIS — M25561 Pain in right knee: Secondary | ICD-10-CM

## 2015-11-10 MED ORDER — LIDOCAINE HCL 1 % IJ SOLN
5.0000 mL | INTRAMUSCULAR | Status: AC | PRN
  Administered 2015-11-10: 5 mL

## 2015-11-10 MED ORDER — METHYLPREDNISOLONE ACETATE 40 MG/ML IJ SUSP
40.0000 mg | INTRAMUSCULAR | Status: AC | PRN
  Administered 2015-11-10: 40 mg via INTRA_ARTICULAR

## 2015-11-10 NOTE — Telephone Encounter (Signed)
Patient would like to set up for an appt for an injection in her back. CB# [phone].
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
## 2015-11-10 NOTE — Progress Notes (Signed)
   Procedure Note  Patient: Julia Mcconnell             Date of Birth: 1935-10-30           MRN: [id number]             Visit Date: 11/10/2015  Procedures: Visit Diagnoses: No diagnosis found.  Large Joint Inj Date/Time: 11/10/2015 1:56 PM Performed by: DUDA, MARCUS V Authorized by: [name]   Consent Given by:  Patient Site marked: the procedure site was marked   Timeout: prior to procedure the correct patient, procedure, and site was verified   Indications:  Pain and diagnostic evaluation Location:  Knee Site:  R knee Needle Size:  22 G Needle Length:  1.5 inches Ultrasound Guidance: No   Fluoroscopic Guidance: No   Arthrogram: No Medications:  5 mL lidocaine 1 %; 40 mg methylPREDNISolone acetate 40 MG/ML Aspiration Attempted: No   Patient tolerance:  Patient tolerated the procedure well with no immediate complications

## 2015-11-11 NOTE — Telephone Encounter (Signed)
Per [facility] website patient has active coverage and no precert is required for [id number] or [phone]

## 2015-11-11 NOTE — Telephone Encounter (Signed)
If helped a lot and no new trauma then will quick eval and repeat same, if didn't help much or she says anything new then OV as she really will need new MRI at some point

## 2015-11-17 ENCOUNTER — Ambulatory Visit (INDEPENDENT_AMBULATORY_CARE_PROVIDER_SITE_OTHER): Payer: 59 | Admitting: Physical Medicine and Rehabilitation

## 2015-11-17 ENCOUNTER — Encounter (INDEPENDENT_AMBULATORY_CARE_PROVIDER_SITE_OTHER): Payer: Self-pay | Admitting: Physical Medicine and Rehabilitation

## 2015-11-17 VITALS — BP 154/62 | HR 65 | Temp 98.0°F

## 2015-11-17 DIAGNOSIS — M48062 Spinal stenosis, lumbar region with neurogenic claudication: Secondary | ICD-10-CM | POA: Diagnosis not present

## 2015-11-17 MED ORDER — METHYLPREDNISOLONE ACETATE 80 MG/ML IJ SUSP
80.0000 mg | Freq: Once | INTRAMUSCULAR | Status: AC
  Administered 2015-11-17: 80 mg

## 2015-11-17 MED ORDER — LIDOCAINE HCL (PF) 1 % IJ SOLN
0.3300 mL | Freq: Once | INTRAMUSCULAR | Status: AC
  Administered 2015-11-17: 0.3 mL
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
## 2015-11-17 NOTE — Procedures (Signed)
Lumbosacral Transforaminal Epidural Steroid Injection - Infraneural Approach with Fluoroscopic Guidance  Patient: Julia Mcconnell      Date of Birth: 02-21-1935 MRN: [id number] PCP: Horatio Pel, MD      Visit Date: 11/17/2015   Universal Protocol:    Date/Time: 11/06/178:46 AM  Consent Given By: the patient  Position: PRONE   Additional Comments: Vital signs were monitored before and after the procedure. Patient was prepped and draped in the usual sterile fashion. The correct patient, procedure, and site was verified.   Injection Procedure Details:  Procedure Site One Meds Administered:  Meds ordered this encounter  Medications  . lidocaine (PF) (XYLOCAINE) 1 % injection 0.3 mL  . methylPREDNISolone acetate (DEPO-MEDROL) injection 80 mg     Laterality: Bilateral  Location/Site:  L4-L5  Needle size: 22 G  Needle type: Spinal  Needle Placement: Transforaminal  Findings:  -Contrast Used: 2 mL iohexol 180 mg iodine/mL   -Comments: Excellent flow of contrast along the nerve and into the epidural space.  Procedure Details: After squaring off the end-plates of the desired vertebral level to get a true AP view, the C-arm was obliqued to the painful side so that the superior articulating process is positioned about 1/3 the length of the inferior endplate.  The needle was aimed toward the junction of the superior articular process and the transverse process of the inferior vertebrae. The needle's initial entry is in the lower third of the foramen through Kambin's triangle. The soft tissues overlying this target were infiltrated with 2-3 ml. of 1% Lidocaine without Epinephrine.  The spinal needle was then inserted and advanced toward the target using a "trajectory" view along the fluoroscope beam.  Under AP and lateral visualization, the needle was advanced so it did not puncture dura and did not traverse medially beyond the 6 o'clock position of the pedicle. Bi-planar  projections were used to confirm position. Aspiration was confirmed to be negative for CSF and/or blood. A 1-2 ml. volume of Isovue-250 was injected and flow of contrast was noted at each level. Radiographs were obtained for documentation purposes.   After attaining the desired flow of contrast documented above, a 0.5 to 1.0 ml test dose of 0.25% Marcaine was injected into each respective transforaminal space.  The patient was observed for 90 seconds post injection.  After no sensory deficits were reported, and normal lower extremity motor function was noted,   the above injectate was administered so that equal amounts of the injectate were placed at each foramen (level) into the transforaminal epidural space.   Additional Comments:  The patient tolerated the procedure well Dressing: Band-Aid    Post-procedure details: Patient was observed during the procedure. Post-procedure instructions were reviewed.  Patient left the clinic in stable condition.

## 2015-11-17 NOTE — Patient Instructions (Signed)

## 2016-01-22 ENCOUNTER — Telehealth (INDEPENDENT_AMBULATORY_CARE_PROVIDER_SITE_OTHER): Payer: Self-pay | Admitting: Orthopedic Surgery

## 2016-01-22 ENCOUNTER — Telehealth (INDEPENDENT_AMBULATORY_CARE_PROVIDER_SITE_OTHER): Payer: Self-pay | Admitting: Physical Medicine and Rehabilitation

## 2016-01-22 NOTE — Telephone Encounter (Signed)
Pt requesting to schedule another inj in her knee please advise  [phone]

## 2016-01-22 NOTE — Telephone Encounter (Signed)
Pt requesting to schedule another inj in her knee please  [phone]

## 2016-01-23 NOTE — Telephone Encounter (Signed)
Made appointment for patient over the phone, she would like to see Dr. Sharol Given only, made appt for week of 1/22

## 2016-02-02 ENCOUNTER — Ambulatory Visit (INDEPENDENT_AMBULATORY_CARE_PROVIDER_SITE_OTHER): Payer: 59 | Admitting: Orthopedic Surgery

## 2016-02-02 DIAGNOSIS — M1711 Unilateral primary osteoarthritis, right knee: Secondary | ICD-10-CM | POA: Diagnosis not present

## 2016-02-02 MED ORDER — LIDOCAINE HCL 1 % IJ SOLN
5.0000 mL | INTRAMUSCULAR | Status: AC | PRN
  Administered 2016-02-02: 5 mL

## 2016-02-02 MED ORDER — METHYLPREDNISOLONE ACETATE 40 MG/ML IJ SUSP
40.0000 mg | INTRAMUSCULAR | Status: AC | PRN
  Administered 2016-02-02: 40 mg via INTRA_ARTICULAR
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
## 2016-03-30 DIAGNOSIS — Z85820 Personal history of malignant melanoma of skin: Secondary | ICD-10-CM | POA: Diagnosis not present

## 2016-03-30 DIAGNOSIS — Z1283 Encounter for screening for malignant neoplasm of skin: Secondary | ICD-10-CM | POA: Diagnosis not present

## 2016-03-30 DIAGNOSIS — Z08 Encounter for follow-up examination after completed treatment for malignant neoplasm: Secondary | ICD-10-CM | POA: Diagnosis not present

## 2016-05-03 ENCOUNTER — Ambulatory Visit (INDEPENDENT_AMBULATORY_CARE_PROVIDER_SITE_OTHER): Payer: 59 | Admitting: Orthopedic Surgery

## 2016-05-03 DIAGNOSIS — M1711 Unilateral primary osteoarthritis, right knee: Secondary | ICD-10-CM | POA: Diagnosis not present
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
## 2016-05-12 DIAGNOSIS — Z1231 Encounter for screening mammogram for malignant neoplasm of breast: Secondary | ICD-10-CM | POA: Diagnosis not present

## 2016-05-12 DIAGNOSIS — Z1289 Encounter for screening for malignant neoplasm of other sites: Secondary | ICD-10-CM | POA: Diagnosis not present

## 2016-05-23 ENCOUNTER — Other Ambulatory Visit (HOSPITAL_COMMUNITY): Payer: Self-pay | Admitting: Cardiovascular Disease

## 2016-05-24 ENCOUNTER — Ambulatory Visit (HOSPITAL_COMMUNITY): Payer: Medicare Other | Attending: Cardiology

## 2016-05-24 ENCOUNTER — Other Ambulatory Visit: Payer: Self-pay

## 2016-05-24 DIAGNOSIS — I351 Nonrheumatic aortic (valve) insufficiency: Secondary | ICD-10-CM | POA: Insufficient documentation

## 2016-05-24 NOTE — Telephone Encounter (Signed)
Rx(s) sent to pharmacy electronically.  

## 2016-05-31 DIAGNOSIS — H18423 Band keratopathy, bilateral: Secondary | ICD-10-CM | POA: Diagnosis not present

## 2016-05-31 DIAGNOSIS — H40023 Open angle with borderline findings, high risk, bilateral: Secondary | ICD-10-CM | POA: Diagnosis not present

## 2016-05-31 DIAGNOSIS — H04123 Dry eye syndrome of bilateral lacrimal glands: Secondary | ICD-10-CM | POA: Diagnosis not present

## 2016-05-31 DIAGNOSIS — H179 Unspecified corneal scar and opacity: Secondary | ICD-10-CM | POA: Diagnosis not present

## 2016-06-08 ENCOUNTER — Ambulatory Visit: Payer: PRIVATE HEALTH INSURANCE | Admitting: Cardiovascular Disease

## 2016-07-12 ENCOUNTER — Encounter (INDEPENDENT_AMBULATORY_CARE_PROVIDER_SITE_OTHER): Payer: Self-pay | Admitting: Orthopedic Surgery

## 2016-07-12 ENCOUNTER — Ambulatory Visit (INDEPENDENT_AMBULATORY_CARE_PROVIDER_SITE_OTHER): Payer: Medicare Other | Admitting: Orthopedic Surgery

## 2016-07-12 VITALS — Ht <= 58 in | Wt 120.0 lb

## 2016-07-12 DIAGNOSIS — G5761 Lesion of plantar nerve, right lower limb: Secondary | ICD-10-CM

## 2016-07-12 DIAGNOSIS — M1711 Unilateral primary osteoarthritis, right knee: Secondary | ICD-10-CM | POA: Diagnosis not present

## 2016-07-12 DIAGNOSIS — I872 Venous insufficiency (chronic) (peripheral): Secondary | ICD-10-CM

## 2016-07-12 NOTE — Patient Instructions (Signed)
Morton Neuralgia Morton neuralgia is a type of foot pain in the area closest to your toes. This area is sometimes called the ball of your foot. Morton neuralgia occurs when a branch of a nerve in your foot (digital nerve) becomes compressed. When this happens over a long period of time, the nerve can thicken (neuroma) and cause pain. This usually occurs between the third and fourth toe. Morton neuralgia can come and go but may get worse over time. What are the causes? Your digital nerve can become compressed and stretched at a point where it passes under a thick band of tissue that connects your toes (intermetatarsal ligament). Morton neuralgia can be caused by mild repetitive damage in this area. This type of damage can result from:  Activities such as running or jumping.  Wearing shoes that are too tight.  What increases the risk? You may be at risk for Morton neuralgia if you:  Are female.  Wear high heels.  Wear shoes that are narrow or tight.  Participate in activities that stretch your toes. These include: ? Running. ? Ballet. ? Long-distance walking.  What are the signs or symptoms? The first symptom of Morton neuralgia is pain that spreads from the ball of your foot to your toes. It may feel like you are walking on a marble. Pain usually gets worse with walking and goes away at night. Other symptoms may include numbness and cramping of your toes. How is this diagnosed? Your health care provider will do a physical exam. When doing the exam, your health care provider may:  Squeeze your foot just behind your toe.  Ask you to move your toes to check for pain.  You may also have tests on your foot to confirm the diagnosis. These may include:  An X-ray.  An MRI.  How is this treated? Treatment for Morton neuralgia may be as simple as changing the kind of shoes you wear. Other treatments may include:  Wearing a supportive pad (orthosis) under the front of your foot. This  lifts your toe bones and takes pressure off the nerve.  Getting injections of numbing medicine and anti-inflammatory medicine (steroid) in the nerve.  Having surgery to remove part of the thickened nerve.  Follow these instructions at home:  Take medicine only as directed by your health care provider.  Wear soft-soled shoes with a wide toe area.  Stop activities that may be causing pain.  Elevate your foot when resting.  Massage your foot.  Apply ice to the injured area: ? Put ice in a plastic bag. ? Place a towel between your skin and the bag. ? Leave the ice on for 20 minutes, 2-3 times a day.  Keep all follow-up visits as directed by your health care provider. This is important. Contact a health care provider if:  Home care instructions are not helping you get better.  Your symptoms change or get worse. This information is not intended to replace advice given to you by your health care provider. Make sure you discuss any questions you have with your health care provider. Document Released: 04/05/2000 Document Revised: 06/05/2015 Document Reviewed: 02/28/2013 Elsevier Interactive Patient Education  2018 Elsevier Inc.  
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
## 2016-07-13 ENCOUNTER — Ambulatory Visit (INDEPENDENT_AMBULATORY_CARE_PROVIDER_SITE_OTHER): Payer: Medicare Other | Admitting: Cardiovascular Disease

## 2016-07-13 ENCOUNTER — Encounter: Payer: Self-pay | Admitting: Cardiovascular Disease

## 2016-07-13 VITALS — BP 156/62 | HR 66 | Ht <= 58 in | Wt 130.6 lb

## 2016-07-13 DIAGNOSIS — I351 Nonrheumatic aortic (valve) insufficiency: Secondary | ICD-10-CM

## 2016-07-13 NOTE — Assessment & Plan Note (Signed)
History of essential hypertension blood pressure measured 156/62. She is on amlodipine and losartan as well as carvedilol. Continue current meds at current dosing.

## 2016-07-13 NOTE — Assessment & Plan Note (Signed)
History of moderate aortic insufficiency with recent 2-D echo performed 05/24/16 revealing normal LV size and function with moderate AI unchanged from prior study one year ago. We will continue to follow her by 2-D echocardiography. She is completely symptomatic.

## 2016-07-13 NOTE — Assessment & Plan Note (Signed)
History of this limited lipidemia on statin therapy followed by her PCP

## 2016-07-13 NOTE — Addendum Note (Signed)
Addended by: Zebedee Iba on: 07/13/2016 01:20 PM   Modules accepted: Orders

## 2016-07-13 NOTE — Patient Instructions (Signed)
Medication Instructions: Your physician recommends that you continue on your current medications as directed. Please refer to the Current Medication list given to you today.  Testing/Procedures: Your physician has requested that you have an echocardiogram in 1 year --prior to follow-up appointment. Echocardiography is a painless test that uses sound waves to create images of your heart. It provides your doctor with information about the size and shape of your heart and how well your heart's chambers and valves are working. This procedure takes approximately one hour. There are no restrictions for this procedure.  Follow-Up: Your physician wants you to follow-up in: 1 year with Dr. Gwenlyn Found. You will receive a reminder letter in the mail two months in advance. If you don't receive a letter, please call our office to schedule the follow-up appointment.  If you need a refill on your cardiac medications before your next appointment, please call your pharmacy.

## 2016-07-13 NOTE — Progress Notes (Signed)
07/13/2016 Julia Mcconnell   24-Jul-1935  [id number]  Primary Physician Deland Pretty, MD Primary Cardiologist: Lorretta Harp MD [name]  HPI:  Ms. Julia Mcconnell is a very pleasant 81 year old thin-appearing single Caucasian female with no children whom I last saw in the office 05/30/15. 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 Her problems include hypertension, hyperlipidemia, and moderate to severe aortic insufficiency which she is asymptomatic from. LV size and function have remained normal by 2D echo. Dr. Shelia Media follows her laboratory exams including lipid profile.   Since I saw her last she has been without complaints except for an episode of what sounds like a TIA, which she was worked up for and which resolved spontaneously. Carotid Dopplers performed in our office December 03, 2011, were essentially normal. Since I saw her back 12 months ago she's remained currently stable. A 2-D echocardiogram performed 05/24/16 revealed normal LV size and function with moderate aortic insufficiency unchanged from prior echoes. She remains asymptomatic. She underwent uncomplicated right total knee replacement by Dr. Sharol Given 06/03/16. She did have a Myoview stress test performed 06/02/15 which was low risk.  Current Outpatient Prescriptions  Medication Sig Dispense Refill  . acetaminophen (TYLENOL) 325 MG tablet Take 650 mg by mouth every 4 (four) hours as needed for mild pain.     Marland Kitchen amLODipine (NORVASC) 5 MG tablet Take 5 mg by mouth daily.    Marland Kitchen atorvastatin (LIPITOR) 20 MG tablet Take 20 mg by mouth daily.    . calcium carbonate (OS-CAL) 600 MG TABS Take 600 mg by mouth 2 (two) times daily.     . carvedilol (COREG) 12.5 MG tablet TAKE ONE-HALF TABLET BY MOUTH TWICE DAILY WITH MEALS 90 tablet 2  . clopidogrel (PLAVIX) 75 MG tablet Take 1 tablet (75 mg total) by mouth daily. 15 tablet 0  . losartan (COZAAR) 50 MG tablet Take 50 mg by mouth daily.   3    . Multiple Vitamin (MULTIVITAMIN) tablet Take 1 tablet by mouth daily.       No current facility-administered medications for this visit.     Allergies  Allergen Reactions  . Codeine Other (See Comments)    Makes me really hyper."    Social History   Social History  . Marital status: Single    Spouse name: N/A  . Number of children: o  . Years of education: 12   Occupational History  .  [employer]  . clerk    Social History Main Topics  . Smoking status: Never Smoker  . Smokeless tobacco: Never Used  . Alcohol use No  . Drug use: No  . Sexual activity: No   Other Topics Concern  . 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     Review of Systems: General: negative for chills, fever, night sweats or weight changes.  Cardiovascular: negative for chest pain, dyspnea on exertion, edema, orthopnea, palpitations, paroxysmal nocturnal dyspnea or shortness of breath Dermatological: negative for rash Respiratory: negative for cough or wheezing Urologic: negative for hematuria Abdominal: negative for nausea, vomiting, diarrhea, bright red blood per rectum, melena, or hematemesis Neurologic: negative for visual changes, syncope, or dizziness All other systems reviewed and are otherwise negative except as noted above.    Blood pressure (!) 156/62, pulse 66, height 4\' 10"  (1.473 m), weight 130 lb 9.6 oz (59.2 kg).  General appearance: alert and no distress Neck: no adenopathy, no carotid bruit, no JVD, supple, symmetrical, trachea midline and thyroid not enlarged, symmetric, no tenderness/mass/nodules Lungs: clear to auscultation bilaterally Heart: regular rate and rhythm, S1, S2 normal, no murmur, click, rub or gallop Extremities: extremities normal, atraumatic, no cyanosis or edema  EKG normal sinus rhythm at 66 with low limb voltage and poor R-wave progression. I personally reviewed this EKG.  ASSESSMENT AND PLAN:    Hypertension History of essential hypertension blood pressure measured 156/62. She is on amlodipine and losartan as well as carvedilol. Continue current meds at current dosing.  Dyslipidemia History of this limited lipidemia on statin therapy followed by her PCP  Moderate aortic regurgitation History of moderate aortic insufficiency with recent 2-D echo performed 05/24/16 revealing normal LV size and function with moderate AI unchanged from prior study one year ago. We will continue to follow her by 2-D echocardiography. She is completely symptomatic.      Lorretta Harp MD FACP,FACC,FAHA, [facility] 07/13/2016 9:27 AM

## 2016-07-15 ENCOUNTER — Telehealth (INDEPENDENT_AMBULATORY_CARE_PROVIDER_SITE_OTHER): Payer: Self-pay

## 2016-07-15 NOTE — Telephone Encounter (Signed)
I called and spoke with Letrice from [place], advised [employer] phone number is [phone]

## 2016-07-15 NOTE — Telephone Encounter (Signed)
Clinton with [facility] would like a call back at [phone] [phone].

## 2016-07-16 ENCOUNTER — Telehealth (INDEPENDENT_AMBULATORY_CARE_PROVIDER_SITE_OTHER): Payer: Self-pay | Admitting: Radiology

## 2016-07-16 NOTE — Telephone Encounter (Signed)
Advised patient her monovisc shot was approved by insurance, primary will pay 80%, supplemental will pay for the remaining 20 %. Appointment scheduled to follow up at the end of this month. Made appt on 08/09/16 at 1015 with Erin.

## 2016-07-21 DIAGNOSIS — H00024 Hordeolum internum left upper eyelid: Secondary | ICD-10-CM | POA: Diagnosis not present

## 2016-07-26 DIAGNOSIS — E559 Vitamin D deficiency, unspecified: Secondary | ICD-10-CM | POA: Diagnosis not present

## 2016-07-26 DIAGNOSIS — M8580 Other specified disorders of bone density and structure, unspecified site: Secondary | ICD-10-CM | POA: Diagnosis not present

## 2016-07-26 DIAGNOSIS — I10 Essential (primary) hypertension: Secondary | ICD-10-CM | POA: Diagnosis not present

## 2016-07-26 DIAGNOSIS — E785 Hyperlipidemia, unspecified: Secondary | ICD-10-CM | POA: Diagnosis not present

## 2016-07-29 DIAGNOSIS — H6123 Impacted cerumen, bilateral: Secondary | ICD-10-CM | POA: Diagnosis not present

## 2016-07-29 DIAGNOSIS — Z0000 Encounter for general adult medical examination without abnormal findings: Secondary | ICD-10-CM | POA: Diagnosis not present

## 2016-07-29 DIAGNOSIS — E7800 Pure hypercholesterolemia, unspecified: Secondary | ICD-10-CM | POA: Diagnosis not present

## 2016-07-29 DIAGNOSIS — I10 Essential (primary) hypertension: Secondary | ICD-10-CM | POA: Diagnosis not present

## 2016-07-29 DIAGNOSIS — M810 Age-related osteoporosis without current pathological fracture: Secondary | ICD-10-CM | POA: Diagnosis not present

## 2016-07-29 DIAGNOSIS — I6529 Occlusion and stenosis of unspecified carotid artery: Secondary | ICD-10-CM | POA: Diagnosis not present

## 2016-07-29 DIAGNOSIS — G4733 Obstructive sleep apnea (adult) (pediatric): Secondary | ICD-10-CM | POA: Diagnosis not present

## 2016-08-03 DIAGNOSIS — Z0000 Encounter for general adult medical examination without abnormal findings: Secondary | ICD-10-CM | POA: Diagnosis not present

## 2016-08-09 ENCOUNTER — Ambulatory Visit (INDEPENDENT_AMBULATORY_CARE_PROVIDER_SITE_OTHER): Payer: Medicare Other | Admitting: Family

## 2016-08-09 ENCOUNTER — Encounter (INDEPENDENT_AMBULATORY_CARE_PROVIDER_SITE_OTHER): Payer: Self-pay | Admitting: Family

## 2016-08-09 VITALS — Ht <= 58 in | Wt 130.0 lb

## 2016-08-09 DIAGNOSIS — M1711 Unilateral primary osteoarthritis, right knee: Secondary | ICD-10-CM | POA: Diagnosis not present

## 2016-08-09 MED ORDER — LIDOCAINE HCL 1 % IJ SOLN
1.0000 mL | INTRAMUSCULAR | Status: AC | PRN
  Administered 2016-08-09: 1 mL

## 2016-08-09 MED ORDER — HYALURONAN 88 MG/4ML IX SOSY
88.0000 mg | PREFILLED_SYRINGE | INTRA_ARTICULAR | Status: AC | PRN
  Administered 2016-08-09: 88 mg via INTRA_ARTICULAR
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
## 2016-08-25 ENCOUNTER — Telehealth (INDEPENDENT_AMBULATORY_CARE_PROVIDER_SITE_OTHER): Payer: Self-pay | Admitting: Orthopedic Surgery

## 2016-08-25 NOTE — Telephone Encounter (Signed)
Patient scheduled 08/27/16 at 8:45am  with Junie Panning for cortisone injection

## 2016-08-25 NOTE — Telephone Encounter (Signed)
Will you call patient and tell her we can do a cortisone injection in the knee if she wants?  And schedule her for an appt?  Thanks

## 2016-08-25 NOTE — Telephone Encounter (Signed)
Patient called saying that the monovisc injection she received at the end of July hasn't helped her, she's still experiencing a lot of pain. She was wondering if she needed to be seen or if this was normal? CB # [phone]

## 2016-08-27 ENCOUNTER — Encounter (INDEPENDENT_AMBULATORY_CARE_PROVIDER_SITE_OTHER): Payer: Self-pay | Admitting: Family

## 2016-08-27 ENCOUNTER — Ambulatory Visit (INDEPENDENT_AMBULATORY_CARE_PROVIDER_SITE_OTHER): Payer: Medicare Other | Admitting: Family

## 2016-08-27 VITALS — Ht <= 58 in | Wt 129.0 lb

## 2016-08-27 DIAGNOSIS — M1711 Unilateral primary osteoarthritis, right knee: Secondary | ICD-10-CM

## 2016-08-27 MED ORDER — LIDOCAINE HCL 1 % IJ SOLN
5.0000 mL | INTRAMUSCULAR | Status: AC | PRN
  Administered 2016-08-27: 5 mL

## 2016-08-27 MED ORDER — METHYLPREDNISOLONE ACETATE 40 MG/ML IJ SUSP
40.0000 mg | INTRAMUSCULAR | Status: AC | PRN
  Administered 2016-08-27: 40 mg via INTRA_ARTICULAR
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
## 2016-10-21 ENCOUNTER — Ambulatory Visit (INDEPENDENT_AMBULATORY_CARE_PROVIDER_SITE_OTHER): Payer: Medicare Other | Admitting: Orthopedic Surgery

## 2016-10-21 ENCOUNTER — Encounter (INDEPENDENT_AMBULATORY_CARE_PROVIDER_SITE_OTHER): Payer: Self-pay | Admitting: Orthopedic Surgery

## 2016-10-21 DIAGNOSIS — M7741 Metatarsalgia, right foot: Secondary | ICD-10-CM | POA: Diagnosis not present

## 2016-10-21 DIAGNOSIS — M6701 Short Achilles tendon (acquired), right ankle: Secondary | ICD-10-CM | POA: Diagnosis not present

## 2016-10-21 DIAGNOSIS — M1711 Unilateral primary osteoarthritis, right knee: Secondary | ICD-10-CM
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
## 2016-12-08 DIAGNOSIS — H35363 Drusen (degenerative) of macula, bilateral: Secondary | ICD-10-CM | POA: Diagnosis not present

## 2016-12-08 DIAGNOSIS — H35372 Puckering of macula, left eye: Secondary | ICD-10-CM | POA: Diagnosis not present

## 2016-12-08 DIAGNOSIS — H40023 Open angle with borderline findings, high risk, bilateral: Secondary | ICD-10-CM | POA: Diagnosis not present

## 2016-12-08 DIAGNOSIS — H35033 Hypertensive retinopathy, bilateral: Secondary | ICD-10-CM | POA: Diagnosis not present

## 2016-12-13 ENCOUNTER — Ambulatory Visit (INDEPENDENT_AMBULATORY_CARE_PROVIDER_SITE_OTHER): Payer: Medicare Other | Admitting: Orthopedic Surgery

## 2016-12-13 ENCOUNTER — Encounter (INDEPENDENT_AMBULATORY_CARE_PROVIDER_SITE_OTHER): Payer: Self-pay | Admitting: Orthopedic Surgery

## 2016-12-13 VITALS — Ht <= 58 in | Wt 129.0 lb

## 2016-12-13 DIAGNOSIS — I872 Venous insufficiency (chronic) (peripheral): Secondary | ICD-10-CM

## 2016-12-13 DIAGNOSIS — M6701 Short Achilles tendon (acquired), right ankle: Secondary | ICD-10-CM

## 2016-12-13 DIAGNOSIS — M7741 Metatarsalgia, right foot: Secondary | ICD-10-CM
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
## 2017-01-13 ENCOUNTER — Encounter (INDEPENDENT_AMBULATORY_CARE_PROVIDER_SITE_OTHER): Payer: Self-pay | Admitting: Orthopedic Surgery

## 2017-01-13 ENCOUNTER — Ambulatory Visit (INDEPENDENT_AMBULATORY_CARE_PROVIDER_SITE_OTHER): Payer: Medicare Other | Admitting: Orthopedic Surgery

## 2017-01-13 DIAGNOSIS — M1711 Unilateral primary osteoarthritis, right knee: Secondary | ICD-10-CM | POA: Diagnosis not present

## 2017-01-13 DIAGNOSIS — M6701 Short Achilles tendon (acquired), right ankle: Secondary | ICD-10-CM

## 2017-01-13 DIAGNOSIS — I872 Venous insufficiency (chronic) (peripheral): Secondary | ICD-10-CM

## 2017-01-13 MED ORDER — METHYLPREDNISOLONE ACETATE 40 MG/ML IJ SUSP
40.0000 mg | INTRAMUSCULAR | Status: AC | PRN
  Administered 2017-01-13: 40 mg via INTRA_ARTICULAR

## 2017-01-13 MED ORDER — LIDOCAINE HCL 1 % IJ SOLN
5.0000 mL | INTRAMUSCULAR | Status: AC | PRN
  Administered 2017-01-13: 5 mL
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
## 2017-02-02 DIAGNOSIS — I10 Essential (primary) hypertension: Secondary | ICD-10-CM | POA: Diagnosis not present

## 2017-02-02 DIAGNOSIS — E7800 Pure hypercholesterolemia, unspecified: Secondary | ICD-10-CM | POA: Diagnosis not present

## 2017-02-02 DIAGNOSIS — E785 Hyperlipidemia, unspecified: Secondary | ICD-10-CM | POA: Diagnosis not present

## 2017-02-02 DIAGNOSIS — Z8673 Personal history of transient ischemic attack (TIA), and cerebral infarction without residual deficits: Secondary | ICD-10-CM | POA: Diagnosis not present

## 2017-02-02 DIAGNOSIS — M810 Age-related osteoporosis without current pathological fracture: Secondary | ICD-10-CM | POA: Diagnosis not present

## 2017-02-07 ENCOUNTER — Other Ambulatory Visit: Payer: Self-pay

## 2017-02-07 ENCOUNTER — Emergency Department (HOSPITAL_COMMUNITY): Payer: Medicare Other

## 2017-02-07 ENCOUNTER — Inpatient Hospital Stay (HOSPITAL_COMMUNITY)
Admission: EM | Admit: 2017-02-07 | Discharge: 2017-02-11 | DRG: 481 | Disposition: A | Discharge status: Skilled Nursing Facility | Payer: Medicare Other | Attending: Internal Medicine | Admitting: Internal Medicine

## 2017-02-07 DIAGNOSIS — T148XXA Other injury of unspecified body region, initial encounter: Secondary | ICD-10-CM | POA: Diagnosis not present

## 2017-02-07 DIAGNOSIS — Z96652 Presence of left artificial knee joint: Secondary | ICD-10-CM | POA: Diagnosis present

## 2017-02-07 DIAGNOSIS — E871 Hypo-osmolality and hyponatremia: Secondary | ICD-10-CM | POA: Diagnosis not present

## 2017-02-07 DIAGNOSIS — D62 Acute posthemorrhagic anemia: Secondary | ICD-10-CM | POA: Diagnosis not present

## 2017-02-07 DIAGNOSIS — S0990XA Unspecified injury of head, initial encounter: Secondary | ICD-10-CM | POA: Diagnosis not present

## 2017-02-07 DIAGNOSIS — Z9842 Cataract extraction status, left eye: Secondary | ICD-10-CM

## 2017-02-07 DIAGNOSIS — Z803 Family history of malignant neoplasm of breast: Secondary | ICD-10-CM

## 2017-02-07 DIAGNOSIS — Z808 Family history of malignant neoplasm of other organs or systems: Secondary | ICD-10-CM

## 2017-02-07 DIAGNOSIS — W19XXXA Unspecified fall, initial encounter: Secondary | ICD-10-CM

## 2017-02-07 DIAGNOSIS — G4733 Obstructive sleep apnea (adult) (pediatric): Secondary | ICD-10-CM | POA: Diagnosis present

## 2017-02-07 DIAGNOSIS — Y9239 Other specified sports and athletic area as the place of occurrence of the external cause: Secondary | ICD-10-CM

## 2017-02-07 DIAGNOSIS — D72819 Decreased white blood cell count, unspecified: Secondary | ICD-10-CM | POA: Diagnosis not present

## 2017-02-07 DIAGNOSIS — Z419 Encounter for procedure for purposes other than remedying health state, unspecified: Secondary | ICD-10-CM

## 2017-02-07 DIAGNOSIS — S72402A Unspecified fracture of lower end of left femur, initial encounter for closed fracture: Secondary | ICD-10-CM | POA: Diagnosis not present

## 2017-02-07 DIAGNOSIS — M79605 Pain in left leg: Secondary | ICD-10-CM | POA: Diagnosis not present

## 2017-02-07 DIAGNOSIS — D696 Thrombocytopenia, unspecified: Secondary | ICD-10-CM | POA: Diagnosis present

## 2017-02-07 DIAGNOSIS — S72002A Fracture of unspecified part of neck of left femur, initial encounter for closed fracture: Secondary | ICD-10-CM | POA: Diagnosis present

## 2017-02-07 DIAGNOSIS — S7292XA Unspecified fracture of left femur, initial encounter for closed fracture: Secondary | ICD-10-CM | POA: Diagnosis not present

## 2017-02-07 DIAGNOSIS — E785 Hyperlipidemia, unspecified: Secondary | ICD-10-CM | POA: Diagnosis present

## 2017-02-07 DIAGNOSIS — S72392A Other fracture of shaft of left femur, initial encounter for closed fracture: Principal | ICD-10-CM | POA: Diagnosis present

## 2017-02-07 DIAGNOSIS — W1830XA Fall on same level, unspecified, initial encounter: Secondary | ICD-10-CM | POA: Diagnosis present

## 2017-02-07 DIAGNOSIS — Z7902 Long term (current) use of antithrombotics/antiplatelets: Secondary | ICD-10-CM

## 2017-02-07 DIAGNOSIS — Z9989 Dependence on other enabling machines and devices: Secondary | ICD-10-CM

## 2017-02-07 DIAGNOSIS — Z885 Allergy status to narcotic agent status: Secondary | ICD-10-CM

## 2017-02-07 DIAGNOSIS — Z8673 Personal history of transient ischemic attack (TIA), and cerebral infarction without residual deficits: Secondary | ICD-10-CM

## 2017-02-07 DIAGNOSIS — I351 Nonrheumatic aortic (valve) insufficiency: Secondary | ICD-10-CM | POA: Diagnosis present

## 2017-02-07 DIAGNOSIS — I10 Essential (primary) hypertension: Secondary | ICD-10-CM | POA: Diagnosis not present

## 2017-02-07 DIAGNOSIS — N289 Disorder of kidney and ureter, unspecified: Secondary | ICD-10-CM | POA: Diagnosis present

## 2017-02-07 DIAGNOSIS — Z833 Family history of diabetes mellitus: Secondary | ICD-10-CM

## 2017-02-07 DIAGNOSIS — M25552 Pain in left hip: Secondary | ICD-10-CM | POA: Diagnosis not present

## 2017-02-07 DIAGNOSIS — Z90710 Acquired absence of both cervix and uterus: Secondary | ICD-10-CM

## 2017-02-07 DIAGNOSIS — S72302A Unspecified fracture of shaft of left femur, initial encounter for closed fracture: Secondary | ICD-10-CM

## 2017-02-07 DIAGNOSIS — Z823 Family history of stroke: Secondary | ICD-10-CM

## 2017-02-07 DIAGNOSIS — Z9841 Cataract extraction status, right eye: Secondary | ICD-10-CM

## 2017-02-07 DIAGNOSIS — Y9354 Activity, bowling: Secondary | ICD-10-CM

## 2017-02-07 DIAGNOSIS — S72012A Unspecified intracapsular fracture of left femur, initial encounter for closed fracture: Secondary | ICD-10-CM | POA: Diagnosis not present

## 2017-02-07 DIAGNOSIS — K5900 Constipation, unspecified: Secondary | ICD-10-CM | POA: Diagnosis present

## 2017-02-07 DIAGNOSIS — S72332A Displaced oblique fracture of shaft of left femur, initial encounter for closed fracture: Secondary | ICD-10-CM | POA: Diagnosis not present

## 2017-02-07 DIAGNOSIS — M810 Age-related osteoporosis without current pathological fracture: Secondary | ICD-10-CM | POA: Diagnosis present

## 2017-02-07 DIAGNOSIS — Z9089 Acquired absence of other organs: Secondary | ICD-10-CM

## 2017-02-07 HISTORY — DX: Gastro-esophageal reflux disease without esophagitis: K21.9

## 2017-02-07 LAB — CBC WITH DIFFERENTIAL/PLATELET
BASOS ABS: 0 10*3/uL (ref 0.0–0.1)
Basophils Relative: 0 %
EOS ABS: 0 10*3/uL (ref 0.0–0.7)
EOS PCT: 1 %
HEMATOCRIT: 35.8 % — AB (ref 36.0–46.0)
Hemoglobin: 12.2 g/dL (ref 12.0–15.0)
Lymphocytes Relative: 21 %
Lymphs Abs: 0.8 10*3/uL (ref 0.7–4.0)
MCH: 32.1 pg (ref 26.0–34.0)
MCHC: 34.1 g/dL (ref 30.0–36.0)
MCV: 94.2 fL (ref 78.0–100.0)
MONO ABS: 0.4 10*3/uL (ref 0.1–1.0)
MONOS PCT: 11 %
Neutro Abs: 2.6 10*3/uL (ref 1.7–7.7)
Neutrophils Relative %: 67 %
PLATELETS: 157 10*3/uL (ref 150–400)
RBC: 3.8 MIL/uL — ABNORMAL LOW (ref 3.87–5.11)
RDW: 14 % (ref 11.5–15.5)
WBC: 3.9 10*3/uL — ABNORMAL LOW (ref 4.0–10.5)

## 2017-02-07 LAB — BASIC METABOLIC PANEL
ANION GAP: 11 (ref 5–15)
BUN: 9 mg/dL (ref 6–20)
CALCIUM: 9.5 mg/dL (ref 8.9–10.3)
CO2: 26 mmol/L (ref 22–32)
CREATININE: 0.69 mg/dL (ref 0.44–1.00)
Chloride: 94 mmol/L — ABNORMAL LOW (ref 101–111)
Glucose, Bld: 143 mg/dL — ABNORMAL HIGH (ref 65–99)
Potassium: 4.5 mmol/L (ref 3.5–5.1)
SODIUM: 131 mmol/L — AB (ref 135–145)

## 2017-02-07 LAB — PROTIME-INR
INR: 0.96
Prothrombin Time: 12.7 seconds (ref 11.4–15.2)

## 2017-02-07 MED ORDER — FENTANYL CITRATE (PF) 100 MCG/2ML IJ SOLN
25.0000 ug | Freq: Once | INTRAMUSCULAR | Status: AC
  Administered 2017-02-07: 25 ug via INTRAVENOUS
  Filled 2017-02-07: qty 2

## 2017-02-07 MED ORDER — MORPHINE SULFATE (PF) 4 MG/ML IV SOLN
4.0000 mg | Freq: Once | INTRAVENOUS | Status: AC
  Administered 2017-02-07: 4 mg via INTRAVENOUS
  Filled 2017-02-07: qty 1

## 2017-02-07 MED ORDER — ONDANSETRON HCL 4 MG/2ML IJ SOLN
4.0000 mg | Freq: Once | INTRAMUSCULAR | Status: AC
  Administered 2017-02-07: 4 mg via INTRAVENOUS
  Filled 2017-02-07: qty 2

## 2017-02-07 NOTE — ED Notes (Signed)
Patient transported to CT 
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
## 2017-02-07 NOTE — ED Triage Notes (Signed)
Pt BIB [facility] following a fall at a a bowling alley. Obvious deformity to the left thigh, splinted by EMS. Pt c/o pain radiating to the left groin. Pedal pulses present, pt able to move toes on left foot. Given 150 mcg fentanyl PTA. Denies hitting her head or LOC, pt does take blood thinners.

## 2017-02-08 ENCOUNTER — Inpatient Hospital Stay (HOSPITAL_COMMUNITY): Payer: Medicare Other

## 2017-02-08 ENCOUNTER — Encounter (HOSPITAL_COMMUNITY): Payer: Self-pay | Admitting: Certified Registered Nurse Anesthetist

## 2017-02-08 ENCOUNTER — Inpatient Hospital Stay (HOSPITAL_COMMUNITY): Payer: Medicare Other | Admitting: Anesthesiology

## 2017-02-08 ENCOUNTER — Encounter (HOSPITAL_COMMUNITY)
Admission: EM | Disposition: A | Discharge status: Skilled Nursing Facility | Payer: Self-pay | Source: Home / Self Care | Attending: Internal Medicine

## 2017-02-08 DIAGNOSIS — I10 Essential (primary) hypertension: Secondary | ICD-10-CM

## 2017-02-08 DIAGNOSIS — E871 Hypo-osmolality and hyponatremia: Secondary | ICD-10-CM

## 2017-02-08 DIAGNOSIS — S72012S Unspecified intracapsular fracture of left femur, sequela: Secondary | ICD-10-CM | POA: Diagnosis not present

## 2017-02-08 DIAGNOSIS — M25552 Pain in left hip: Secondary | ICD-10-CM | POA: Diagnosis not present

## 2017-02-08 DIAGNOSIS — S7292XA Unspecified fracture of left femur, initial encounter for closed fracture: Secondary | ICD-10-CM | POA: Diagnosis not present

## 2017-02-08 DIAGNOSIS — Z803 Family history of malignant neoplasm of breast: Secondary | ICD-10-CM | POA: Diagnosis not present

## 2017-02-08 DIAGNOSIS — Z90710 Acquired absence of both cervix and uterus: Secondary | ICD-10-CM | POA: Diagnosis not present

## 2017-02-08 DIAGNOSIS — D72819 Decreased white blood cell count, unspecified: Secondary | ICD-10-CM

## 2017-02-08 DIAGNOSIS — Z96652 Presence of left artificial knee joint: Secondary | ICD-10-CM | POA: Diagnosis present

## 2017-02-08 DIAGNOSIS — M810 Age-related osteoporosis without current pathological fracture: Secondary | ICD-10-CM | POA: Diagnosis present

## 2017-02-08 DIAGNOSIS — S7222XA Displaced subtrochanteric fracture of left femur, initial encounter for closed fracture: Secondary | ICD-10-CM | POA: Diagnosis not present

## 2017-02-08 DIAGNOSIS — S72002A Fracture of unspecified part of neck of left femur, initial encounter for closed fracture: Secondary | ICD-10-CM | POA: Diagnosis not present

## 2017-02-08 DIAGNOSIS — Z833 Family history of diabetes mellitus: Secondary | ICD-10-CM | POA: Diagnosis not present

## 2017-02-08 DIAGNOSIS — W19XXXA Unspecified fall, initial encounter: Secondary | ICD-10-CM | POA: Diagnosis not present

## 2017-02-08 DIAGNOSIS — R2689 Other abnormalities of gait and mobility: Secondary | ICD-10-CM | POA: Diagnosis not present

## 2017-02-08 DIAGNOSIS — Z9989 Dependence on other enabling machines and devices: Secondary | ICD-10-CM

## 2017-02-08 DIAGNOSIS — M25551 Pain in right hip: Secondary | ICD-10-CM | POA: Diagnosis not present

## 2017-02-08 DIAGNOSIS — S72392A Other fracture of shaft of left femur, initial encounter for closed fracture: Secondary | ICD-10-CM | POA: Diagnosis not present

## 2017-02-08 DIAGNOSIS — G8911 Acute pain due to trauma: Secondary | ICD-10-CM | POA: Diagnosis not present

## 2017-02-08 DIAGNOSIS — Z7902 Long term (current) use of antithrombotics/antiplatelets: Secondary | ICD-10-CM | POA: Diagnosis not present

## 2017-02-08 DIAGNOSIS — G4733 Obstructive sleep apnea (adult) (pediatric): Secondary | ICD-10-CM | POA: Diagnosis present

## 2017-02-08 DIAGNOSIS — D62 Acute posthemorrhagic anemia: Secondary | ICD-10-CM | POA: Diagnosis not present

## 2017-02-08 DIAGNOSIS — I351 Nonrheumatic aortic (valve) insufficiency: Secondary | ICD-10-CM | POA: Diagnosis present

## 2017-02-08 DIAGNOSIS — Z8673 Personal history of transient ischemic attack (TIA), and cerebral infarction without residual deficits: Secondary | ICD-10-CM | POA: Diagnosis not present

## 2017-02-08 DIAGNOSIS — T847XXS Infection and inflammatory reaction due to other internal orthopedic prosthetic devices, implants and grafts, sequela: Secondary | ICD-10-CM | POA: Diagnosis not present

## 2017-02-08 DIAGNOSIS — D696 Thrombocytopenia, unspecified: Secondary | ICD-10-CM | POA: Diagnosis not present

## 2017-02-08 DIAGNOSIS — Z808 Family history of malignant neoplasm of other organs or systems: Secondary | ICD-10-CM | POA: Diagnosis not present

## 2017-02-08 DIAGNOSIS — R262 Difficulty in walking, not elsewhere classified: Secondary | ICD-10-CM | POA: Diagnosis not present

## 2017-02-08 DIAGNOSIS — Z9089 Acquired absence of other organs: Secondary | ICD-10-CM | POA: Diagnosis not present

## 2017-02-08 DIAGNOSIS — S72302A Unspecified fracture of shaft of left femur, initial encounter for closed fracture: Secondary | ICD-10-CM | POA: Diagnosis not present

## 2017-02-08 DIAGNOSIS — S72402A Unspecified fracture of lower end of left femur, initial encounter for closed fracture: Secondary | ICD-10-CM | POA: Diagnosis not present

## 2017-02-08 DIAGNOSIS — Y9354 Activity, bowling: Secondary | ICD-10-CM | POA: Diagnosis not present

## 2017-02-08 DIAGNOSIS — Z9842 Cataract extraction status, left eye: Secondary | ICD-10-CM | POA: Diagnosis not present

## 2017-02-08 DIAGNOSIS — G459 Transient cerebral ischemic attack, unspecified: Secondary | ICD-10-CM | POA: Diagnosis not present

## 2017-02-08 DIAGNOSIS — E785 Hyperlipidemia, unspecified: Secondary | ICD-10-CM | POA: Diagnosis not present

## 2017-02-08 DIAGNOSIS — W1830XA Fall on same level, unspecified, initial encounter: Secondary | ICD-10-CM | POA: Diagnosis present

## 2017-02-08 DIAGNOSIS — Z9841 Cataract extraction status, right eye: Secondary | ICD-10-CM | POA: Diagnosis not present

## 2017-02-08 DIAGNOSIS — Z4789 Encounter for other orthopedic aftercare: Secondary | ICD-10-CM | POA: Diagnosis not present

## 2017-02-08 DIAGNOSIS — Z885 Allergy status to narcotic agent status: Secondary | ICD-10-CM | POA: Diagnosis not present

## 2017-02-08 DIAGNOSIS — Z823 Family history of stroke: Secondary | ICD-10-CM | POA: Diagnosis not present

## 2017-02-08 DIAGNOSIS — S72012D Unspecified intracapsular fracture of left femur, subsequent encounter for closed fracture with routine healing: Secondary | ICD-10-CM | POA: Diagnosis not present

## 2017-02-08 DIAGNOSIS — M6281 Muscle weakness (generalized): Secondary | ICD-10-CM | POA: Diagnosis not present

## 2017-02-08 DIAGNOSIS — K5900 Constipation, unspecified: Secondary | ICD-10-CM | POA: Diagnosis not present

## 2017-02-08 DIAGNOSIS — N289 Disorder of kidney and ureter, unspecified: Secondary | ICD-10-CM | POA: Diagnosis present

## 2017-02-08 DIAGNOSIS — R488 Other symbolic dysfunctions: Secondary | ICD-10-CM | POA: Diagnosis not present

## 2017-02-08 DIAGNOSIS — Y9239 Other specified sports and athletic area as the place of occurrence of the external cause: Secondary | ICD-10-CM | POA: Diagnosis not present

## 2017-02-08 HISTORY — PX: INTRAMEDULLARY (IM) NAIL INTERTROCHANTERIC: SHX5875

## 2017-02-08 HISTORY — PX: FEMUR IM NAIL: SHX1597

## 2017-02-08 LAB — POCT I-STAT 4, (NA,K, GLUC, HGB,HCT)
Glucose, Bld: 107 mg/dL — ABNORMAL HIGH (ref 65–99)
HEMATOCRIT: 19 % — AB (ref 36.0–46.0)
Hemoglobin: 6.5 g/dL — CL (ref 12.0–15.0)
POTASSIUM: 4.2 mmol/L (ref 3.5–5.1)
SODIUM: 135 mmol/L (ref 135–145)

## 2017-02-08 LAB — SURGICAL PCR SCREEN
MRSA, PCR: NEGATIVE
Staphylococcus aureus: NEGATIVE

## 2017-02-08 LAB — ABO/RH: ABO/RH(D): O POS

## 2017-02-08 LAB — PREPARE RBC (CROSSMATCH)

## 2017-02-08 SURGERY — FIXATION, FRACTURE, INTERTROCHANTERIC, WITH INTRAMEDULLARY ROD
Anesthesia: General | Laterality: Left

## 2017-02-08 MED ORDER — CLOPIDOGREL BISULFATE 75 MG PO TABS
75.0000 mg | ORAL_TABLET | Freq: Every day | ORAL | Status: DC
  Administered 2017-02-09 – 2017-02-11 (×3): 75 mg via ORAL
  Filled 2017-02-08 (×3): qty 1

## 2017-02-08 MED ORDER — PHENOL 1.4 % MT LIQD: 1.0000 | OROMUCOSAL | Status: DC | PRN

## 2017-02-08 MED ORDER — PHENYLEPHRINE HCL 10 MG/ML IJ SOLN
INTRAMUSCULAR | Status: DC | PRN
  Administered 2017-02-08: 40 ug/min via INTRAVENOUS

## 2017-02-08 MED ORDER — METOCLOPRAMIDE HCL 5 MG/ML IJ SOLN: 5.0000 mg | Freq: Three times a day (TID) | INTRAMUSCULAR | Status: DC | PRN

## 2017-02-08 MED ORDER — SUGAMMADEX SODIUM 200 MG/2ML IV SOLN
INTRAVENOUS | Status: AC
  Filled 2017-02-08: qty 2

## 2017-02-08 MED ORDER — LACTATED RINGERS IV SOLN
INTRAVENOUS | Status: DC
  Administered 2017-02-08: 17:00:00 via INTRAVENOUS

## 2017-02-08 MED ORDER — FENTANYL CITRATE (PF) 100 MCG/2ML IJ SOLN
INTRAMUSCULAR | Status: DC | PRN
  Administered 2017-02-08: 100 ug via INTRAVENOUS
  Administered 2017-02-08: 25 ug via INTRAVENOUS
  Administered 2017-02-08: 50 ug via INTRAVENOUS
  Administered 2017-02-08: 25 ug via INTRAVENOUS

## 2017-02-08 MED ORDER — PROMETHAZINE HCL 25 MG/ML IJ SOLN: 6.2500 mg | INTRAMUSCULAR | Status: DC | PRN

## 2017-02-08 MED ORDER — LOSARTAN POTASSIUM 50 MG PO TABS
50.0000 mg | ORAL_TABLET | Freq: Every day | ORAL | Status: DC
  Administered 2017-02-09: 50 mg via ORAL
  Filled 2017-02-08: qty 1

## 2017-02-08 MED ORDER — EPHEDRINE SULFATE 50 MG/ML IJ SOLN
INTRAMUSCULAR | Status: DC | PRN
  Administered 2017-02-08 (×3): 10 mg via INTRAVENOUS
  Administered 2017-02-08: 20 mg via INTRAVENOUS
  Administered 2017-02-08: 5 mg via INTRAVENOUS

## 2017-02-08 MED ORDER — LIDOCAINE HCL (CARDIAC) 20 MG/ML IV SOLN
INTRAVENOUS | Status: DC | PRN
  Administered 2017-02-08: 60 mg via INTRAVENOUS

## 2017-02-08 MED ORDER — DEXAMETHASONE SODIUM PHOSPHATE 10 MG/ML IJ SOLN
INTRAMUSCULAR | Status: AC
  Filled 2017-02-08: qty 1

## 2017-02-08 MED ORDER — MENTHOL 3 MG MT LOZG: 1.0000 | LOZENGE | OROMUCOSAL | Status: DC | PRN

## 2017-02-08 MED ORDER — ENOXAPARIN SODIUM 40 MG/0.4ML ~~LOC~~ SOLN
40.0000 mg | SUBCUTANEOUS | Status: DC
  Administered 2017-02-09: 40 mg via SUBCUTANEOUS
  Filled 2017-02-08 (×2): qty 0.4

## 2017-02-08 MED ORDER — HYDRALAZINE HCL 20 MG/ML IJ SOLN: 5.0000 mg | INTRAMUSCULAR | Status: DC | PRN

## 2017-02-08 MED ORDER — DEXAMETHASONE SODIUM PHOSPHATE 4 MG/ML IJ SOLN
INTRAMUSCULAR | Status: DC | PRN
  Administered 2017-02-08: 10 mg via INTRAVENOUS

## 2017-02-08 MED ORDER — CALCIUM CARBONATE 1250 (500 CA) MG PO TABS
1.0000 | ORAL_TABLET | Freq: Two times a day (BID) | ORAL | Status: DC
Start: 2017-02-09 — End: 2017-02-11
  Administered 2017-02-09 – 2017-02-11 (×5): 500 mg via ORAL
  Filled 2017-02-08 (×5): qty 1

## 2017-02-08 MED ORDER — ATORVASTATIN CALCIUM 20 MG PO TABS
20.0000 mg | ORAL_TABLET | Freq: Every day | ORAL | Status: DC
  Filled 2017-02-08: qty 1

## 2017-02-08 MED ORDER — ONDANSETRON HCL 4 MG/2ML IJ SOLN: 4.0000 mg | Freq: Four times a day (QID) | INTRAMUSCULAR | Status: DC | PRN

## 2017-02-08 MED ORDER — ACETAMINOPHEN 325 MG PO TABS: 650.0000 mg | ORAL_TABLET | ORAL | 1 refills | Status: DC | PRN

## 2017-02-08 MED ORDER — SODIUM CHLORIDE 0.9 % IV SOLN
INTRAVENOUS | Status: DC
  Administered 2017-02-08 – 2017-02-09 (×2): via INTRAVENOUS

## 2017-02-08 MED ORDER — PHENYLEPHRINE HCL 10 MG/ML IJ SOLN
INTRAMUSCULAR | Status: DC | PRN
  Administered 2017-02-08 (×3): 80 ug via INTRAVENOUS
  Administered 2017-02-08: 120 ug via INTRAVENOUS
  Administered 2017-02-08 (×4): 80 ug via INTRAVENOUS

## 2017-02-08 MED ORDER — FENTANYL CITRATE (PF) 100 MCG/2ML IJ SOLN
25.0000 ug | INTRAMUSCULAR | Status: DC | PRN
  Administered 2017-02-08 (×3): 25 ug via INTRAVENOUS
  Filled 2017-02-08 (×3): qty 2

## 2017-02-08 MED ORDER — PHENYLEPHRINE 40 MCG/ML (10ML) SYRINGE FOR IV PUSH (FOR BLOOD PRESSURE SUPPORT)
PREFILLED_SYRINGE | INTRAVENOUS | Status: AC
  Filled 2017-02-08: qty 10

## 2017-02-08 MED ORDER — FENTANYL CITRATE (PF) 100 MCG/2ML IJ SOLN: 25.0000 ug | INTRAMUSCULAR | Status: DC | PRN

## 2017-02-08 MED ORDER — CARVEDILOL 6.25 MG PO TABS
6.2500 mg | ORAL_TABLET | Freq: Two times a day (BID) | ORAL | Status: DC
  Administered 2017-02-08: 6.25 mg via ORAL
  Filled 2017-02-08: qty 1

## 2017-02-08 MED ORDER — CALCIUM CARBONATE 600 MG PO TABS: 600.0000 mg | ORAL_TABLET | Freq: Two times a day (BID) | ORAL | Status: DC

## 2017-02-08 MED ORDER — ACETAMINOPHEN 650 MG RE SUPP: 650.0000 mg | Freq: Four times a day (QID) | RECTAL | Status: DC | PRN

## 2017-02-08 MED ORDER — ALBUMIN HUMAN 5 % IV SOLN
INTRAVENOUS | Status: DC | PRN
  Administered 2017-02-08 (×4): via INTRAVENOUS

## 2017-02-08 MED ORDER — ROCURONIUM BROMIDE 10 MG/ML (PF) SYRINGE
PREFILLED_SYRINGE | INTRAVENOUS | Status: AC
  Filled 2017-02-08: qty 5

## 2017-02-08 MED ORDER — HYDROCODONE-ACETAMINOPHEN 5-325 MG PO TABS
1.0000 | ORAL_TABLET | Freq: Four times a day (QID) | ORAL | Status: DC | PRN
  Administered 2017-02-09: 1 via ORAL
  Filled 2017-02-08: qty 2

## 2017-02-08 MED ORDER — ACETAMINOPHEN 325 MG PO TABS
650.0000 mg | ORAL_TABLET | ORAL | Status: DC | PRN
  Administered 2017-02-10 (×2): 650 mg via ORAL
  Filled 2017-02-08 (×2): qty 2

## 2017-02-08 MED ORDER — ASPIRIN EC 81 MG PO TBEC: 81.0000 mg | DELAYED_RELEASE_TABLET | Freq: Every day | ORAL | 1 refills | Status: DC

## 2017-02-08 MED ORDER — CEFAZOLIN SODIUM-DEXTROSE 2-4 GM/100ML-% IV SOLN
2.0000 g | INTRAVENOUS | Status: AC
  Administered 2017-02-08: 2 g via INTRAVENOUS
  Filled 2017-02-08 (×2): qty 100

## 2017-02-08 MED ORDER — ONDANSETRON HCL 4 MG PO TABS: 4.0000 mg | ORAL_TABLET | Freq: Four times a day (QID) | ORAL | Status: DC | PRN

## 2017-02-08 MED ORDER — POVIDONE-IODINE 10 % EX SWAB: 2.0000 "application " | Freq: Once | CUTANEOUS | Status: DC

## 2017-02-08 MED ORDER — SODIUM CHLORIDE 0.9 % IV SOLN: Freq: Once | INTRAVENOUS | Status: DC

## 2017-02-08 MED ORDER — AMLODIPINE BESYLATE 5 MG PO TABS: 5.0000 mg | ORAL_TABLET | Freq: Every day | ORAL | Status: DC

## 2017-02-08 MED ORDER — MORPHINE SULFATE (PF) 2 MG/ML IV SOLN: 0.5000 mg | INTRAVENOUS | Status: DC | PRN

## 2017-02-08 MED ORDER — ASPIRIN EC 325 MG PO TBEC
325.0000 mg | DELAYED_RELEASE_TABLET | Freq: Every day | ORAL | Status: DC
  Administered 2017-02-09 – 2017-02-10 (×2): 325 mg via ORAL
  Filled 2017-02-08 (×2): qty 1

## 2017-02-08 MED ORDER — SUCCINYLCHOLINE CHLORIDE 20 MG/ML IJ SOLN
INTRAMUSCULAR | Status: DC | PRN
  Administered 2017-02-08: 80 mg via INTRAVENOUS

## 2017-02-08 MED ORDER — ACETAMINOPHEN 500 MG PO TABS: 500.0000 mg | ORAL_TABLET | Freq: Four times a day (QID) | ORAL | 0 refills | Status: DC | PRN

## 2017-02-08 MED ORDER — ONDANSETRON HCL 4 MG/2ML IJ SOLN
INTRAMUSCULAR | Status: AC
  Filled 2017-02-08: qty 2

## 2017-02-08 MED ORDER — ACETAMINOPHEN 325 MG PO TABS: 650.0000 mg | ORAL_TABLET | Freq: Four times a day (QID) | ORAL | Status: DC | PRN

## 2017-02-08 MED ORDER — 0.9 % SODIUM CHLORIDE (POUR BTL) OPTIME
TOPICAL | Status: DC | PRN
  Administered 2017-02-08: 1000 mL

## 2017-02-08 MED ORDER — FENTANYL CITRATE (PF) 250 MCG/5ML IJ SOLN
INTRAMUSCULAR | Status: AC
  Filled 2017-02-08: qty 5

## 2017-02-08 MED ORDER — LIDOCAINE 2% (20 MG/ML) 5 ML SYRINGE
INTRAMUSCULAR | Status: AC
  Filled 2017-02-08: qty 5

## 2017-02-08 MED ORDER — METOCLOPRAMIDE HCL 5 MG PO TABS: 5.0000 mg | ORAL_TABLET | Freq: Three times a day (TID) | ORAL | Status: DC | PRN

## 2017-02-08 MED ORDER — SODIUM CHLORIDE 0.9 % IV SOLN: INTRAVENOUS | Status: DC

## 2017-02-08 MED ORDER — SODIUM CHLORIDE 0.9 % IV BOLUS (SEPSIS)
500.0000 mL | Freq: Once | INTRAVENOUS | Status: AC
  Administered 2017-02-08: 500 mL via INTRAVENOUS

## 2017-02-08 MED ORDER — PROPOFOL 10 MG/ML IV BOLUS
INTRAVENOUS | Status: DC | PRN
  Administered 2017-02-08: 50 mg via INTRAVENOUS

## 2017-02-08 MED ORDER — PROPOFOL 10 MG/ML IV BOLUS
INTRAVENOUS | Status: AC
  Filled 2017-02-08: qty 20

## 2017-02-08 MED ORDER — SUCCINYLCHOLINE CHLORIDE 200 MG/10ML IV SOSY
PREFILLED_SYRINGE | INTRAVENOUS | Status: AC
  Filled 2017-02-08: qty 10

## 2017-02-08 MED ORDER — CEFAZOLIN SODIUM-DEXTROSE 2-4 GM/100ML-% IV SOLN
2.0000 g | Freq: Four times a day (QID) | INTRAVENOUS | Status: AC
  Administered 2017-02-09 (×2): 2 g via INTRAVENOUS
  Filled 2017-02-08 (×2): qty 100

## 2017-02-08 MED ORDER — CHLORHEXIDINE GLUCONATE 4 % EX LIQD: 60.0000 mL | Freq: Once | CUTANEOUS | Status: DC

## 2017-02-08 MED ORDER — ONDANSETRON HCL 4 MG/2ML IJ SOLN
INTRAMUSCULAR | Status: DC | PRN
  Administered 2017-02-08: 4 mg via INTRAVENOUS

## 2017-02-08 MED ORDER — EPHEDRINE 5 MG/ML INJ
INTRAVENOUS | Status: AC
  Filled 2017-02-08: qty 10

## 2017-02-08 SURGICAL SUPPLY — 42 items
BLADE SURG 15 STRL LF DISP TIS (BLADE) ×1 IMPLANT
BLADE SURG 15 STRL SS (BLADE) ×3
COVER BACK TABLE 60X90IN (DRAPES) ×3 IMPLANT
COVER PERINEAL POST (MISCELLANEOUS) ×3 IMPLANT
COVER SURGICAL LIGHT HANDLE (MISCELLANEOUS) ×4 IMPLANT
DRAPE C-ARM 42X72 X-RAY (DRAPES) ×3 IMPLANT
DRAPE STERI IOBAN 125X83 (DRAPES) ×3 IMPLANT
DRSG ADAPTIC 3X8 NADH LF (GAUZE/BANDAGES/DRESSINGS) ×3 IMPLANT
DRSG MEPILEX BORDER 4X4 (GAUZE/BANDAGES/DRESSINGS) ×1 IMPLANT
DRSG MEPILEX BORDER 4X8 (GAUZE/BANDAGES/DRESSINGS) ×3 IMPLANT
ELECT REM PT RETURN 9FT ADLT (ELECTROSURGICAL) ×3
ELECTRODE REM PT RTRN 9FT ADLT (ELECTROSURGICAL) ×1 IMPLANT
EVACUATOR 1/8 PVC DRAIN (DRAIN) IMPLANT
GLOVE BIOGEL PI IND STRL 6.5 (GLOVE) IMPLANT
GLOVE BIOGEL PI IND STRL 9 (GLOVE) ×1 IMPLANT
GLOVE BIOGEL PI INDICATOR 6.5 (GLOVE) ×2
GLOVE BIOGEL PI INDICATOR 9 (GLOVE) ×2
GLOVE SURG ORTHO 9.0 STRL STRW (GLOVE) ×3 IMPLANT
GOWN STRL REUS W/ TWL XL LVL3 (GOWN DISPOSABLE) ×3 IMPLANT
GOWN STRL REUS W/TWL XL LVL3 (GOWN DISPOSABLE) ×9
GUIDEWIRE 3.2X400 (WIRE) ×6 IMPLANT
KIT BASIN OR (CUSTOM PROCEDURE TRAY) ×3 IMPLANT
KIT ROOM TURNOVER OR (KITS) ×3 IMPLANT
LINER BOOT UNIVERSAL DISP (MISCELLANEOUS) ×3 IMPLANT
MANIFOLD NEPTUNE II (INSTRUMENTS) ×3 IMPLANT
NS IRRIG 1000ML POUR BTL (IV SOLUTION) ×3 IMPLANT
PACK GENERAL/GYN (CUSTOM PROCEDURE TRAY) ×3 IMPLANT
PAD ARMBOARD 7.5X6 YLW CONV (MISCELLANEOUS) ×6 IMPLANT
REAMER ROD DEEP FLUTE 2.5X950 (INSTRUMENTS) ×2 IMPLANT
SCREW CANN 10X340 TI LT STER (Screw) ×3 IMPLANT
SCREW CANN 6.5X75 T25 STAR ST (Screw) ×4 IMPLANT
STAPLER VISISTAT 35W (STAPLE) IMPLANT
SUT ETHILON 2 0 PSLX (SUTURE) ×4 IMPLANT
SUT VIC AB 0 CT1 27 (SUTURE) ×3
SUT VIC AB 0 CT1 27XBRD ANBCTR (SUTURE) IMPLANT
SUT VIC AB 1 CTX 36 (SUTURE) ×3
SUT VIC AB 1 CTX36XBRD ANBCTR (SUTURE) IMPLANT
SUT VIC AB 2-0 CT1 27 (SUTURE) ×3
SUT VIC AB 2-0 CT1 TAPERPNT 27 (SUTURE) IMPLANT
SUT VIC AB 2-0 CTB1 (SUTURE) IMPLANT
WATER STERILE IRR 1000ML POUR (IV SOLUTION) ×6 IMPLANT
YANKAUER SUCT BULB TIP NO VENT (SUCTIONS) ×2 IMPLANT

## 2017-02-08 NOTE — Transfer of Care (Signed)
22Immediate Anesthesia Transfer of Care Note  Patient: Julia Mcconnell  Procedure(s) Performed: INTRAMEDULLARY (IM) NAIL LEFT HIP (Left )  Patient Location: PACU  Anesthesia Type:General   Level of Consciousness: awake, alert  and oriented  Airway & Oxygen Therapy: Patient Spontanous Breathing and Patient connected to face mask oxygen  Post-op Assessment: Report given to RN and Post -op Vital signs reviewed and stable  Post vital signs: Reviewed and stable  Last Vitals:  Vitals:   02/08/17 1045 02/08/17 1115  BP: (!) 155/54 (!) 138/56  Pulse: 76 72  Resp: 18 14  SpO2: 97% 94%    Last Pain:  Vitals:   02/08/17 0937  PainSc: 3          Complications: No apparent anesthesia complications

## 2017-02-08 NOTE — Clinical Social Work Note (Signed)
Clinical Social Work Assessment  Patient Details  Name: Julia Mcconnell MRN: [id number] Date of Birth: 1935/10/03  Date of referral:  02/08/17               Reason for consult:  Facility Placement                Permission sought to share information with:  Family Supports Permission granted to share information::  Yes, Verbal Permission Granted  Name::     Cousins (no names given at this time. Pt's mother recently passed away. )  Agency::     Relationship::     Contact Information:     Housing/Transportation Living arrangements for the past 2 months:  Single Family Home(alone. ) Source of Information:  Patient Patient Interpreter Needed:  None Criminal Activity/Legal Involvement Pertinent to Current Situation/Hospitalization:  No - Comment as needed Significant Relationships:  Other Family Members(cousins and nephew. ) Lives with:  Self Do you feel safe going back to the place where you live?  Yes Need for family participation in patient care:  Yes (Comment)  Care giving concerns:  CSW spoke with pt and cousin and family friend at bedside. Pt expressed no concerns to CSW at this time as pt is understanding of plan of care at this time.    Social Worker assessment / plan:  CSW spoke with pt at bedside. Pt informed CSW that pt is from home. Pt reported that pt use to live with mother, however mother recently passed away in Feb 01, 2017. Pt now has support ad assistance from a nephew and cousins who live 30 minutes away. Pt is agreeable to get rehab and would prefer to have placement at [facility] if needed.   Employment status:  Retired Forensic scientist:  Medicare PT Recommendations:  Not assessed at this time [place] / Referral to community resources:  [place]  Patient/Family's Response to care:  At this time pt appeared to be understanding and agreeable to plan of care.   Patient/Family's Understanding of and Emotional Response to Diagnosis, Current  Treatment, and Prognosis:  No further questions or concerns have been presented to CSW at this time.   Emotional Assessment Appearance:  Appears stated age Attitude/Demeanor/Rapport:  Engaged Affect (typically observed):  Appropriate, Pleasant Orientation:  Oriented to Situation, Oriented to Self, Oriented to  Time, Oriented to Place Alcohol / Substance use:    Psych involvement (Current and /or in the community):  No (Comment)  Discharge Needs  Concerns to be addressed:  No discharge needs identified Readmission within the last 30 days:  No Current discharge risk:  Lack of support system Barriers to Discharge:      Wetzel Bjornstad, [place] 02/08/2017, 11:06 AM
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
## 2017-02-08 NOTE — Progress Notes (Signed)
Patient admitted earlier today by Dr. Fuller Plan. See H&P for details  82 year old female with history of hypertension, hyperlipidemia, CVA, obstructive sleep apnea presented to the emergency department after a fall.  Patient complained of left hip pain, found to have displaced left proximal femur fracture.  Orthopedics consulted and appreciated, pending surgery.  Currently patient stable. Complains of left hip/leg pain.   Shakeira Rhee D.O. [facility] Pager [phone]  If 7PM-7AM, please contact night-coverage [email or web address] Password [email or web address] 02/08/2017, 2:13 PM
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
## 2017-02-08 NOTE — Progress Notes (Signed)
Attending doctor paged about pt BP 84/44 manually upon arrival, 500 ml bolus ordered and it is currently infusing, pt still steeping but easy to arouse, will continue to monitor thanks.

## 2017-02-08 NOTE — Anesthesia Procedure Notes (Signed)
Procedure Name: Intubation Date/Time: 02/08/2017 5:17 PM Performed by: Sameka Bagent T, CRNA Pre-anesthesia Checklist: Patient identified, Emergency Drugs available, Suction available and Patient being monitored Patient Re-evaluated:Patient Re-evaluated prior to induction Oxygen Delivery Method: Circle system utilized Preoxygenation: Pre-oxygenation with 100% oxygen Induction Type: IV induction Ventilation: Mask ventilation without difficulty Laryngoscope Size: Mac and 4 Grade View: Grade I Tube type: Oral Tube size: 7.5 mm Number of attempts: 1 Airway Equipment and Method: Patient positioned with wedge pillow and Stylet Placement Confirmation: ETT inserted through vocal cords under direct vision,  positive ETCO2 and breath sounds checked- equal and bilateral Secured at: 21 cm Tube secured with: Tape Dental Injury: Teeth and Oropharynx as per pre-operative assessment

## 2017-02-08 NOTE — Op Note (Signed)
02/08/2017  6:50 PM  PATIENT:  Julia Mcconnell    PRE-OPERATIVE DIAGNOSIS:  Left Subtroch  hip fracture  POST-OPERATIVE DIAGNOSIS:  Same  PROCEDURE:  INTRAMEDULLARY (IM) NAIL LEFT HIP  SURGEON:  [name], MD  PHYSICIAN ASSISTANT:None ANESTHESIA:   General  PREOPERATIVE INDICATIONS:  Julia Mcconnell is a  82 y.o. female with a diagnosis of Left Subtroch  hip fracture who failed conservative measures and elected for surgical management.    The risks benefits and alternatives were discussed with the patient preoperatively including but not limited to the risks of infection, bleeding, nerve injury, cardiopulmonary complications, the need for revision surgery, among others, and the patient was willing to proceed.  OPERATIve MPLANTS: Synthes trochanteric Recon nail 9 x 340 mm locked proximally with 75 mm recon screw.  OPERATIVE FINDINGS: Comminuted fracture site required second incision.  OPERATIVE PROCEDURE: Patient was brought the operating room and underwent a general anesthetic.  After adequate levels of anesthesia were obtained patient was placed on the [facility] fracture table the left lower extremity was placed in boot traction the fracture was reduced under C-arm fluoroscopy the right lower extremity was secured to the cross member of the [place] fracture table and the left lower extremity was prepped using DuraPrep draped into a sterile field.  A timeout was called.  An incision was made just proximal to the greater trochanter guidewire was inserted from the greater code trochanter down the shaft this was verified by C-arm fluoroscopy in 2 views.  This was overreamed proximally.  A guidewire was inserted down the shaft using a reduction tool I was unable to obtain reduction.  A second incision was then made at the fracture site the fracture was reduced and the guidewire was advanced across the fracture.  The canal was sequentially reamed from 8.5-10.5 mm for a 9 mm nail.  The nail was  inserted and a recon guidewire was placed center center in the femoral head this measured 75 mm and a 75 mm screw was inserted.  C-arm fluoroscopy verified reduction of the fracture site verified the alignment of the recon screw.  The wounds were irrigated with normal saline the fascia was closed using 0 Vicryl the skin was closed using 2-0 nylon and staples.  Mepilex dressings were applied patient was extubated taken to the PACU in stable condition.   DISCHARGE PLANNING:  Antibiotic duration: 24-hour antibiotics.  Weightbearing: Weightbearing as tolerated.  Pain medication: Anticipate discharged on Tylenol for pain.  Dressing care/ Wound VAC: Dry dressing change as needed.  Ambulatory devices: Walker.  Discharge to: Skilled nursing facility.  Follow-up: In the office 1 week post operative.

## 2017-02-08 NOTE — ED Notes (Signed)
Admitting provider at bedside.

## 2017-02-08 NOTE — Progress Notes (Signed)
Pts BP upon arrival to 2E36 62'H systolic.  Pt placed in trendelenburg BP rechecked in both arms, and confirmed with a manual cuff.  Pressure in Right leg after placed trendelenburg is 476 systolic.  Rapid response nurse Magda Paganini called by this RN to give her a heads up of the situation.  She will stop by to check on the pt.  Receiving RN Blanch Media is at the bedside and confident in caring for the patient.

## 2017-02-09 ENCOUNTER — Encounter (HOSPITAL_COMMUNITY): Payer: Self-pay | Admitting: General Practice

## 2017-02-09 DIAGNOSIS — S72302A Unspecified fracture of shaft of left femur, initial encounter for closed fracture: Secondary | ICD-10-CM

## 2017-02-09 DIAGNOSIS — S7292XA Unspecified fracture of left femur, initial encounter for closed fracture: Secondary | ICD-10-CM

## 2017-02-09 DIAGNOSIS — W19XXXA Unspecified fall, initial encounter: Secondary | ICD-10-CM

## 2017-02-09 LAB — BASIC METABOLIC PANEL
Anion gap: 8 (ref 5–15)
BUN: 16 mg/dL (ref 6–20)
CO2: 22 mmol/L (ref 22–32)
CREATININE: 0.87 mg/dL (ref 0.44–1.00)
Calcium: 7.4 mg/dL — ABNORMAL LOW (ref 8.9–10.3)
Chloride: 105 mmol/L (ref 101–111)
Glucose, Bld: 145 mg/dL — ABNORMAL HIGH (ref 65–99)
Potassium: 4.4 mmol/L (ref 3.5–5.1)
SODIUM: 135 mmol/L (ref 135–145)

## 2017-02-09 LAB — CBC
HCT: 25.2 % — ABNORMAL LOW (ref 36.0–46.0)
Hemoglobin: 8.7 g/dL — ABNORMAL LOW (ref 12.0–15.0)
MCH: 30.7 pg (ref 26.0–34.0)
MCHC: 34.5 g/dL (ref 30.0–36.0)
MCV: 89 fL (ref 78.0–100.0)
PLATELETS: 99 10*3/uL — AB (ref 150–400)
RBC: 2.83 MIL/uL — ABNORMAL LOW (ref 3.87–5.11)
RDW: 15.4 % (ref 11.5–15.5)
WBC: 6.2 10*3/uL (ref 4.0–10.5)

## 2017-02-09 MED ORDER — ATORVASTATIN CALCIUM 20 MG PO TABS
20.0000 mg | ORAL_TABLET | Freq: Every day | ORAL | Status: DC
  Administered 2017-02-09 – 2017-02-10 (×2): 20 mg via ORAL
  Filled 2017-02-09 (×2): qty 1

## 2017-02-09 NOTE — Evaluation (Signed)
Occupational Therapy Evaluation Patient Details Name: Julia Mcconnell MRN: [id number] DOB: 26-Dec-1935 Today's Date: 02/09/2017    History of Present Illness Julia Mcconnell is a 82 y.o. female with medical history significant of HTN, HLD, history of CVA, OSA on CPAP, chronic back pain; who [place] with complaints of left hip pain.  At baseline the patient lives alone and ambulates with the assistance of a cane. Now s/p IM nail, WBAT   Clinical Impression   Pt reports she was independent with ADL PTA. Currently pt overall max assist +2 for stand pivot transfers, min assist for UB ADL, and max assist for LB ADL. Recommending SNF for follow up to maximize independence and safety with ADL and functional mobility prior to return home. Pt would benefit from continued skilled OT to address established goals.    Follow Up Recommendations  SNF;Supervision/Assistance - 24 hour    Equipment Recommendations  Other (comment)(TBD at next venue)    Recommendations for Other Services       Precautions / Restrictions Precautions Precautions: Fall Restrictions Weight Bearing Restrictions: Yes LLE Weight Bearing: Weight bearing as tolerated      Mobility Bed Mobility Overal bed mobility: Needs Assistance Bed Mobility: Supine to Sit     Supine to sit: Max assist     General bed mobility comments: Pt OOB in chair upon arrival  Transfers Overall transfer level: Needs assistance Equipment used: 2 person hand held assist;Rolling walker (2 wheeled) Transfers: Sit to/from [facility] Sit to Stand: Max assist(with RW) Stand pivot transfers: Max assist;+2 physical assistance       General transfer comment: Max multimodal cues for initation and sequencing, proper hand placement and technqiue. RW for sit to stand from chair x1 with max assist. Stand pivot with 2 person assist    Balance Overall balance assessment: Needs  assistance Sitting-balance support: Feet supported Sitting balance-Leahy Scale: Poor     Standing balance support: Bilateral upper extremity supported Standing balance-Leahy Scale: Zero                             ADL either performed or assessed with clinical judgement   ADL Overall ADL's : Needs assistance/impaired Eating/Feeding: Set up;Sitting   Grooming: Set up;Supervision/safety;Sitting   Upper Body Bathing: Minimal assistance;Sitting   Lower Body Bathing: Maximal assistance;+2 for physical assistance;Sit to/from stand   Upper Body Dressing : Minimal assistance;Sitting   Lower Body Dressing: Maximal assistance;+2 for physical assistance;Sit to/from stand   Toilet Transfer: Maximal assistance;+2 for physical assistance;Stand-pivot;BSC           Functional mobility during ADLs: Maximal assistance;+2 for physical assistance(for stand pivot only)       Vision         Perception     Praxis      Pertinent Vitals/Pain Pain Assessment: Faces Faces Pain Scale: Hurts whole lot Pain Location: L hip Pain Descriptors / Indicators: Aching;Sore;Grimacing;Guarding Pain Intervention(s): Monitored during session;Limited activity within patient's tolerance;Repositioned;Patient requesting pain meds-RN notified     Hand Dominance     Extremity/Trunk Assessment Upper Extremity Assessment Upper Extremity Assessment: Generalized weakness   Lower Extremity Assessment Lower Extremity Assessment: Defer to PT evaluation LLE Deficits / Details: Significantly decr AROM and strength, limited by pain postop LLE: Unable to fully assess due to pain   Cervical / Trunk Assessment Cervical / Trunk Assessment: Kyphotic   Communication Communication Communication: No difficulties  Cognition Arousal/Alertness: Awake/alert Behavior During Therapy: Anxious Overall Cognitive Status: Within Functional Limits for tasks assessed                                      General Comments       Exercises     Shoulder Instructions      Home Living Family/patient expects to be discharged to:: Private residence Living Arrangements: Alone                                      Prior Functioning/Environment Level of Independence: Independent;Independent with assistive device(s)        Comments: Uses straight cane occasionally; keeps score for a bowling league        OT Problem List: Decreased strength;Decreased range of motion;Decreased activity tolerance;Impaired balance (sitting and/or standing);Decreased knowledge of use of DME or AE;Decreased knowledge of precautions;Pain      OT Treatment/Interventions: Self-care/ADL training;Therapeutic exercise;Energy conservation;DME and/or AE instruction;Therapeutic activities;Patient/family education;Balance training    OT Goals(Current goals can be found in the care plan section) Acute Rehab OT Goals Patient Stated Goal: back to independence OT Goal Formulation: With patient Time For Goal Achievement: 02/23/17 Potential to Achieve Goals: Good ADL Goals Pt Will Perform Lower Body Bathing: with min assist;sit to/from stand Pt Will Perform Lower Body Dressing: with min assist;sit to/from stand Pt Will Transfer to Toilet: with min assist;stand pivot transfer;bedside commode Pt Will Perform Toileting - Clothing Manipulation and hygiene: with min assist;sit to/from stand  OT Frequency: Min 2X/week   Barriers to D/C: Decreased caregiver support  pt lives alone       Co-evaluation              AM-PAC PT "6 Clicks" Daily Activity     Outcome Measure Help from another person eating meals?: None Help from another person taking care of personal grooming?: A Little Help from another person toileting, which includes using toliet, bedpan, or urinal?: A Lot Help from another person bathing (including washing, rinsing, drying)?: A Lot Help from another person to put on and taking off  regular upper body clothing?: A Little Help from another person to put on and taking off regular lower body clothing?: A Lot 6 Click Score: 16   End of Session Equipment Utilized During Treatment: Gait belt;Rolling walker Nurse Communication: Mobility status(RN tech)  Activity Tolerance: Patient limited by pain Patient left: with nursing/sitter in room(on BSC)  OT Visit Diagnosis: Unsteadiness on feet (R26.81);Other abnormalities of gait and mobility (R26.89);Pain Pain - Right/Left: Left Pain - part of body: Hip                Time: 1520-1550 OT Time Calculation (min): 30 min Charges:  OT General Charges $OT Visit: 1 Visit OT Evaluation $OT Eval Moderate Complexity: 1 Mod OT Treatments $Self Care/Home Management : 8-22 mins G-Codes:     Duc Crocket A. Ulice Brilliant, M.S., OTR/L Pager: [place] 02/09/2017, 3:59 PM

## 2017-02-09 NOTE — Anesthesia Postprocedure Evaluation (Signed)
Anesthesia Post Note  Patient: TAUNI SANKS  Procedure(s) Performed: INTRAMEDULLARY (IM) NAIL LEFT HIP (Left )     Patient location during evaluation: PACU Anesthesia Type: General Level of consciousness: awake and alert Pain management: pain level controlled Vital Signs Assessment: post-procedure vital signs reviewed and stable Respiratory status: spontaneous breathing, nonlabored ventilation, respiratory function stable and patient connected to nasal cannula oxygen Cardiovascular status: blood pressure returned to baseline and stable Postop Assessment: no apparent nausea or vomiting Anesthetic complications: no Comments: Received 2 units pRBC in PACU, improved BP.    Last Vitals:  Vitals:   02/08/17 2329 02/09/17 0348  BP: (!) 120/41 (!) 99/40  Pulse: 78 88  Resp:  18  Temp:  36.8 C  SpO2: 93% 99%    Last Pain:  Vitals:   02/09/17 0348  TempSrc: Oral  PainSc:                  Catalina Gravel

## 2017-02-09 NOTE — Progress Notes (Signed)
Patient ID: Julia Mcconnell, female   DOB: 14-Aug-1935, 82 y.o.   MRN: [id number] Patient is postoperative day 1 intramedullary fixation left proximal femur fracture.  Patient has no complaints this morning she is alert and oriented.  She is asymptomatic.  She may be weightbearing as tolerated with physical therapy.  Anticipate discharge to skilled nursing facility.  Dressing reinforced as to be expected
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
## 2017-02-09 NOTE — Plan of Care (Signed)
  Nutrition: Adequate nutrition will be maintained 02/09/2017 1227 - Progressing by Williams Che, RN   Elimination: Will not experience complications related to bowel motility 02/09/2017 1227 - Progressing by Williams Che, RN   Pain Managment: General experience of comfort will improve 02/09/2017 1227 - Progressing by Williams Che, RN   Safety: Ability to remain free from injury will improve 02/09/2017 1227 - Progressing by Williams Che, RN

## 2017-02-09 NOTE — Evaluation (Signed)
Physical Therapy Evaluation Patient Details Name: Julia Mcconnell MRN: [id number] DOB: [birth year] Today's Date: 02/09/2017   History of Present Illness  Julia Mcconnell is a 82 y.o. female with medical history significant of HTN, HLD, history of CVA, OSA on CPAP, chronic back pain; who [place] with complaints of left hip pain.  At baseline the patient lives alone and ambulates with the assistance of a cane. Now s/p IM nail, WBAT  Clinical Impression   Patient is s/p above surgery resulting in functional limitations due to the deficits listed below (see PT Problem List). Presents with decr functional mobility, pain limiting upright activity tolerance;  Patient will benefit from skilled PT to increase their independence and safety with mobility to allow discharge to the venue listed below.       Follow Up Recommendations SNF;Supervision/Assistance - 24 hour    Equipment Recommendations  Rolling walker with 5" wheels;3in1 (PT)    Recommendations for Other Services OT consult(as ordered)     Precautions / Restrictions Precautions Precautions: Fall Restrictions LLE Weight Bearing: Weight bearing as tolerated      Mobility  Bed Mobility Overal bed mobility: Needs Assistance Bed Mobility: Supine to Sit     Supine to sit: Max assist     General bed mobility comments: Cues for technique; Noted difficulty using non-operative RLE to half bridge to EOB; used bed pad to scoot body to EOB in prep for getting up; used rails to help; needing max assist to clear LEs off EOB and elevate trunk to sit  Transfers Overall transfer level: Needs assistance Equipment used: Rolling walker (2 wheeled) Transfers: Sit to/from [facility] Sit to Stand: Max assist Stand pivot transfers: Max assist       General transfer comment: Max assist to power up to stand; Once up, able to stand and support self on RW; difficulty with weight shift and steps,  requiring max assist to turn and guide hips to chair, as well as control descent to sit  Ambulation/Gait             General Gait Details: unable today  Stairs            Wheelchair Mobility    Modified Rankin (Stroke Patients Only)       Balance Overall balance assessment: Needs assistance Sitting-balance support: Bilateral upper extremity supported Sitting balance-Leahy Scale: Fair       Standing balance-Leahy Scale: Zero(Approaching Poor)                               Pertinent Vitals/Pain Pain Assessment: Faces Faces Pain Scale: Hurts whole lot Pain Location: L hip with movement or Weight bearing Pain Descriptors / Indicators: Discomfort;Grimacing;Guarding;Aching Pain Intervention(s): Monitored during session;Repositioned    Home Living Family/patient expects to be discharged to:: Skilled nursing facility                      Prior Function Level of Independence: Independent;Independent with assistive device(s)         Comments: Uses straight cane occasionally; keeps score for a bowling league     Hand Dominance        Extremity/Trunk Assessment   Upper Extremity Assessment Upper Extremity Assessment: Defer to OT evaluation;Generalized weakness    Lower Extremity Assessment Lower Extremity Assessment: Generalized weakness;LLE deficits/detail LLE Deficits / Details: Significantly decr AROM and strength, limited by pain  postop LLE: Unable to fully assess due to pain    Cervical / Trunk Assessment Cervical / Trunk Assessment: Kyphotic  Communication   Communication: No difficulties  Cognition Arousal/Alertness: Awake/alert Behavior During Therapy: WFL for tasks assessed/performed Overall Cognitive Status: Within Functional Limits for tasks assessed                                        General Comments      Exercises     Assessment/Plan    PT Assessment Patient needs continued PT services   PT Problem List Decreased strength;Decreased range of motion;Decreased activity tolerance;Decreased balance;Decreased mobility;Decreased coordination;Decreased knowledge of use of DME;Decreased knowledge of precautions;Pain       PT Treatment Interventions DME instruction;Gait training;Functional mobility training;Therapeutic activities;Therapeutic exercise;Balance training;Patient/family education    PT Goals (Current goals can be found in the Care Plan section)  Acute Rehab PT Goals Patient Stated Goal: back to independence PT Goal Formulation: With patient Time For Goal Achievement: 02/23/17 Potential to Achieve Goals: Good    Frequency Min 3X/week   Barriers to discharge        Co-evaluation               AM-PAC PT "6 Clicks" Daily Activity  Outcome Measure Difficulty turning over in bed (including adjusting bedclothes, sheets and blankets)?: Unable Difficulty moving from lying on back to sitting on the side of the bed? : Unable Difficulty sitting down on and standing up from a chair with arms (e.g., wheelchair, bedside commode, etc,.)?: Unable Help needed moving to and from a bed to chair (including a wheelchair)?: Total Help needed walking in hospital room?: Total Help needed climbing 3-5 steps with a railing? : Total 6 Click Score: 6    End of Session Equipment Utilized During Treatment: Gait belt Activity Tolerance: Patient tolerated treatment well Patient left: in chair;with call bell/phone within reach Nurse Communication: Mobility status PT Visit Diagnosis: Unsteadiness on feet (R26.81);Other abnormalities of gait and mobility (R26.89);History of falling (Z91.81);Pain Pain - Right/Left: Left Pain - part of body: Hip    Time: 1124-1150 PT Time Calculation (min) (ACUTE ONLY): 26 min   Charges:   PT Evaluation $PT Eval Moderate Complexity: 1 Mod PT Treatments $Therapeutic Activity: 8-22 mins   PT G Codes:        Julia Mcconnell, PT  Acute  Rehabilitation Services Pager [phone] Office [phone]   Julia Mcconnell 02/09/2017, 1:14 PM

## 2017-02-09 NOTE — Progress Notes (Signed)
Pt refused CPAP for the night. RT will continue to monitor as needed.  

## 2017-02-09 NOTE — Progress Notes (Signed)
PACU RN Colon Branch called to make me aware pt with a SBP 80's post op. On my arrival pt lying supine in bed, NS 500 cc bolus infusing, order placed and completed prior to my arrival. Pt asymptomatic, alert and oriented x3, denies any SOB or CP. BP 120/41 after completion of NS bolus. RN Blanch Media encouraged to call for any concerns and to monitor BP's closely.

## 2017-02-09 NOTE — Progress Notes (Signed)
Placed pt on CPAP for the night. Pt currently tolerating well at this time. RT will continue to monitor as needed.

## 2017-02-10 ENCOUNTER — Encounter (HOSPITAL_COMMUNITY): Payer: Self-pay | Admitting: Orthopedic Surgery

## 2017-02-10 LAB — BASIC METABOLIC PANEL
Anion gap: 6 (ref 5–15)
BUN: 29 mg/dL — ABNORMAL HIGH (ref 6–20)
CALCIUM: 7.8 mg/dL — AB (ref 8.9–10.3)
CHLORIDE: 110 mmol/L (ref 101–111)
CO2: 23 mmol/L (ref 22–32)
Creatinine, Ser: 1.32 mg/dL — ABNORMAL HIGH (ref 0.44–1.00)
GFR calc non Af Amer: 37 mL/min — ABNORMAL LOW (ref >60)
GFR, EST AFRICAN AMERICAN: 43 mL/min — AB (ref >60)
Glucose, Bld: 100 mg/dL — ABNORMAL HIGH (ref 65–99)
POTASSIUM: 4.3 mmol/L (ref 3.5–5.1)
Sodium: 139 mmol/L (ref 135–145)

## 2017-02-10 LAB — PREPARE RBC (CROSSMATCH)

## 2017-02-10 LAB — CBC
HEMATOCRIT: 20.4 % — AB (ref 36.0–46.0)
HEMOGLOBIN: 7 g/dL — AB (ref 12.0–15.0)
MCH: 31.1 pg (ref 26.0–34.0)
MCHC: 34.3 g/dL (ref 30.0–36.0)
MCV: 90.7 fL (ref 78.0–100.0)
Platelets: 126 10*3/uL — ABNORMAL LOW (ref 150–400)
RBC: 2.25 MIL/uL — AB (ref 3.87–5.11)
RDW: 16.5 % — ABNORMAL HIGH (ref 11.5–15.5)
WBC: 5.5 10*3/uL (ref 4.0–10.5)

## 2017-02-10 MED ORDER — SODIUM CHLORIDE 0.9 % IV SOLN: Freq: Once | INTRAVENOUS | Status: DC

## 2017-02-10 MED ORDER — ENOXAPARIN SODIUM 30 MG/0.3ML ~~LOC~~ SOLN
30.0000 mg | SUBCUTANEOUS | Status: DC
  Administered 2017-02-10: 30 mg via SUBCUTANEOUS
  Filled 2017-02-10: qty 0.3

## 2017-02-10 MED ORDER — POLYETHYLENE GLYCOL 3350 17 G PO PACK: 17.0000 g | PACK | Freq: Every day | ORAL | Status: DC | PRN

## 2017-02-10 MED ORDER — SENNOSIDES-DOCUSATE SODIUM 8.6-50 MG PO TABS
1.0000 | ORAL_TABLET | Freq: Two times a day (BID) | ORAL | Status: DC
  Administered 2017-02-10 – 2017-02-11 (×4): 1 via ORAL
  Filled 2017-02-10 (×3): qty 1

## 2017-02-10 NOTE — Progress Notes (Signed)
Orthopedic Tech Progress Note Patient Details:  Julia Mcconnell 1935-08-20 [id number]  Patient ID: Julia Mcconnell, female   DOB: [birth year], 82 y.o.   MRN: [id number] Pt cant have ohf due to age restrictions  Julia Mcconnell 02/10/2017, 12:08 AM

## 2017-02-10 NOTE — Social Work (Signed)
CSW met with patient at bedside to discuss SNF offers. Pt accepted SNF offer from [facility].  CSW will f/u with SNF.  Elissa Hefty, LCSW Clinical Social Worker [phone]

## 2017-02-10 NOTE — Progress Notes (Signed)
[facility]                                                                              Patient Demographics  Julia Mcconnell, is a 82 y.o. female, DOB - 1935/02/27, HEN:[id number]  Admit date - 02/07/2017   Admitting Physician Norval Morton, MD  Outpatient Primary MD for the patient is Deland Pretty, MD  Outpatient specialists:   LOS - 2  days   Medical records reviewed and are as summarized below:    Chief Complaint  Patient presents with  . Fall       Brief summary   Julia Mcconnell is a 82 y.o. female with medical history significant of HTN, HLD, history of CVA, OSA on CPAP, chronic back pain who [place] with [place] pain.  Patient at baseline lives alone and ambulate with assistance of cane.She had been coming out of the bathroom when another person was coming into the bathroom and she knocked backwards onto her left hip.  Denies any loss of consciousness or trauma to her head.  X-ray showed acute displaced proximal left femur fracture.   Assessment & Plan    Principal Problem:   Fracture, proximal femur, left, closed, initial encounter ([place]) secondary to mechanical fall -Orthopedics consulted, patient underwent intramedullary left hip nail surgery on 1/29, postop day #2 -Pain control, DVT perplexes per orthopedics, DC aspirin  Active Problems:   Hypertension -BP currently stable, continue to hold Coreg, amlodipine, losartan  History of CVA/TIA -Continue Plavix, Lipitor, hold antihypertensives  Anemia postop/acute blood loss anemia -Hemoglobin 6.5 on 1/29 received packed RBC transfusion -Hemoglobin down to 7.0, transfused 2 units of packed RBCs today -Discontinue aspirin  Thrombocytopenia -Platelet count is improving    OSA on CPAP -Continue CPAP  Mild acute renal insufficiency -Creatinine 1.3 today likely due to anemia, transfused 2 units packed RBCs  Code Status: full  DVT Prophylaxis:  Lovenox    Family Communication: Discussed in detail with the patient, all imaging results, lab results explained to the patient    Disposition Plan: Will need skilled nursing facility  Time Spent in minutes 25 minutes  Procedures:  INTRAMEDULLARY (IM) NAIL LEFT HIP  Consultants:   ortho  Antimicrobials:      Medications  Scheduled Meds: . atorvastatin  20 mg Oral QHS  . calcium carbonate  1 tablet Oral BID WC  . clopidogrel  75 mg Oral Daily  . enoxaparin (LOVENOX) injection  40 mg Subcutaneous Q24H  . senna-docusate  1 tablet Oral BID   Continuous Infusions: . sodium chloride    . lactated ringers     PRN Meds:.acetaminophen, hydrALAZINE, HYDROcodone-acetaminophen, menthol-cetylpyridinium **OR** phenol, metoCLOPramide **OR** metoCLOPramide (REGLAN) injection, morphine injection, ondansetron **OR** ondansetron (ZOFRAN) IV, polyethylene glycol   Antibiotics   Anti-infectives (From admission, onward)   Start     Dose/Rate Route Frequency Ordered Stop   02/09/17 0000  ceFAZolin (ANCEF) IVPB 2g/100 mL premix     2 g 200 mL/hr over 30 Minutes Intravenous Every 6 hours 02/08/17 2206 02/09/17 0549   02/08/17 0815  ceFAZolin (ANCEF) IVPB 2g/100 mL  premix     2 g 200 mL/hr over 30 Minutes Intravenous On call to O.R. 02/08/17 0812 02/08/17 1725        Subjective:   Julia Mcconnell was seen and examined today.  No complaints per the patient.  BP improving.  Pain controlled.  Patient denies dizziness, chest pain, shortness of breath, abdominal pain, N/V/D/C, new weakness, numbess, tingling.    Objective:   Vitals:   02/09/17 1330 02/09/17 1954 02/10/17 0638 02/10/17 1203  BP: (!) 93/39 (!) 114/33 (!) 117/39   Pulse: 71 82 82   Resp: 18 18 18 18   Temp: 98.8 F (37.1 C) 98.3 F (36.8 C) 97.8 F (36.6 C)   TempSrc:  Oral Oral Oral  SpO2: 93% 91% 91% 91%  Weight:      Height:        Intake/Output Summary (Last 24 hours) at 02/10/2017 1222 Last data filed at 02/10/2017  0700 Gross per 24 hour  Intake 480 ml  Output -  Net 480 ml     Wt Readings from Last 3 Encounters:  02/07/17 58.1 kg (128 lb)  12/13/16 58.5 kg (129 lb)  08/27/16 58.5 kg (129 lb)     Exam   General: Alert and oriented x 3, NAD  Eyes:   HEENT:    Cardiovascular: S1-S2 clear, RRR  Respiratory: Clear to auscultation bilaterally, no wheezing, rales or rhonchi  Gastrointestinal: Soft, nontender, nondistended, + bowel sounds  Ext: no pedal edema bilaterally  Neuro: no new deficits  Musculoskeletal: No digital cyanosis, clubbing  Skin: No rashes  Psych: Normal affect and demeanor, alert and oriented x3    Data Reviewed:  I have personally reviewed following labs and imaging studies  Micro Results Recent Results (from the past 240 hour(s))  Surgical pcr screen     Status: None   Collection Time: 02/08/17  3:30 PM  Result Value Ref Range Status   MRSA, PCR NEGATIVE NEGATIVE Final   Staphylococcus aureus NEGATIVE NEGATIVE Final    Comment: (NOTE) The Xpert SA Assay (FDA approved for NASAL specimens in patients 72 years of age and older), is one component of a comprehensive surveillance program. It is not intended to diagnose infection nor to guide or monitor treatment.     Radiology Reports Dg Chest 1 View  Result Date: 02/07/2017 CLINICAL DATA:  Fracture to left femur tonight. EXAM: CHEST 1 VIEW COMPARISON:  Chest x-ray dated 03/25/2014. FINDINGS: Study is slightly hypoinspiratory and lordotic. Cardiomediastinal silhouette is grossly stable given the patient positioning. Lungs are clear. No pleural effusion or pneumothorax seen. Osseous structures about the chest are unremarkable. IMPRESSION: No active disease. Electronically Signed   By: Franki Cabot M.D.   On: 02/07/2017 23:56   Dg Pelvis 1-2 Views  Result Date: 02/07/2017 CLINICAL DATA:  Fall. EXAM: PELVIS - 1-2 VIEW COMPARISON:  None. FINDINGS: Patient rotation limits examination. There is an oblique  fracture of the proximal left femoral shaft with lateral displacement and superior overriding of the distal fracture fragment. Pelvis and hips appear intact. There is suggestion of old fracture deformities of the left pubic rami. SI joints and symphysis pubis are not displaced. Degenerative changes in the lower lumbar spine and hips. IMPRESSION: Oblique fracture of the proximal left femoral shaft. No acute fractures demonstrated in the pelvis or hips. Degenerative changes. Electronically Signed   By: Lucienne Capers M.D.   On: 02/07/2017 23:55   Dg Tibia/fibula Left  Result Date: 02/07/2017 CLINICAL DATA:  Fall.  Femur fracture. EXAM: LEFT TIBIA AND FIBULA - 2 VIEW COMPARISON:  None. FINDINGS: Left tibia and fibula appear normally aligned. No fracture line or displaced fracture fragment identified. Adjacent soft tissues are unremarkable. IMPRESSION: Negative. Electronically Signed   By: Franki Cabot M.D.   On: 02/07/2017 23:58   Ct Head Wo Contrast  Result Date: 02/07/2017 CLINICAL DATA:  Fall, on blood thinners. EXAM: CT HEAD WITHOUT CONTRAST TECHNIQUE: Contiguous axial images were obtained from the base of the skull through the vertex without intravenous contrast. COMPARISON:  MRI 04/06/2012 FINDINGS: Brain: No acute intracranial abnormality. Specifically, no hemorrhage, hydrocephalus, mass lesion, acute infarction, or significant intracranial injury. Vascular: No hyperdense vessel or unexpected calcification. Skull: No acute calvarial abnormality. Sinuses/Orbits: Mucosal thickening throughout the paranasal sinuses. Mastoid air cells are clear. Orbital soft tissues unremarkable. Other: None IMPRESSION: No acute intracranial abnormality. Chronic sinusitis. Electronically Signed   By: Rolm Baptise M.D.   On: 02/07/2017 23:29   Dg C-arm 1-60 Min  Result Date: 02/08/2017 CLINICAL DATA:  ORIF of a comminuted fracture involving the proximal left femur. EXAM: OPERATIVE LEFT FEMUR 1 VIEW COMPARISON:  Left  femur x-rays 02/07/2017. FINDINGS: Three spot images from the C-arm fluoroscopic device are submitted for interpretation postoperatively, AP images of the left femur. An intramedullary nail has been placed across the fracture involving the proximal femoral metaphysis. Alignment appears near anatomic. IMPRESSION: Near anatomic alignment post ORIF of the comminuted proximal left femur fracture with placement of an intramedullary nail. Electronically Signed   By: Evangeline Dakin M.D.   On: 02/08/2017 20:54   Dg Femur 1v Left  Result Date: 02/08/2017 CLINICAL DATA:  ORIF of a comminuted fracture involving the proximal left femur. EXAM: OPERATIVE LEFT FEMUR 1 VIEW COMPARISON:  Left femur x-rays 02/07/2017. FINDINGS: Three spot images from the C-arm fluoroscopic device are submitted for interpretation postoperatively, AP images of the left femur. An intramedullary nail has been placed across the fracture involving the proximal femoral metaphysis. Alignment appears near anatomic. IMPRESSION: Near anatomic alignment post ORIF of the comminuted proximal left femur fracture with placement of an intramedullary nail. Electronically Signed   By: Evangeline Dakin M.D.   On: 02/08/2017 20:54   Dg Femur Min 2 Views Left  Result Date: 02/07/2017 CLINICAL DATA:  Fall today, deformity to left thigh. EXAM: LEFT FEMUR 2 VIEWS COMPARISON:  None. FINDINGS: Prominently displaced fracture within the proximal left femur shaft, proximal diaphysis, with overriding of the main fracture fragments. Left femoral head remains normally positioned at the left acetabulum. Distal left femur appears intact and normally aligned. Total left knee arthroplasty hardware appears intact and appropriately positioned. Soft tissues about the left femur are unremarkable. IMPRESSION: Significantly displaced fracture of the proximal left femur diaphysis, with overriding of the main fracture fragments. Electronically Signed   By: Franki Cabot M.D.   On:  02/07/2017 23:57    Lab Data:  CBC: Recent Labs  Lab 02/07/17 2232 02/08/17 1903 02/09/17 0621 02/10/17 0822  WBC 3.9*  --  6.2 5.5  NEUTROABS 2.6  --   --   --   HGB 12.2 6.5* 8.7* 7.0*  HCT 35.8* 19.0* 25.2* 20.4*  MCV 94.2  --  89.0 90.7  PLT 157  --  99* 956*   Basic Metabolic Panel: Recent Labs  Lab 02/07/17 2232 02/08/17 1903 02/09/17 0621 02/10/17 0822  NA 131* 135 135 139  K 4.5 4.2 4.4 4.3  CL 94*  --  105 110  CO2 26  --  22 23  GLUCOSE 143* 107* 145* 100*  BUN 9  --  16 29*  CREATININE 0.69  --  0.87 1.32*  CALCIUM 9.5  --  7.4* 7.8*   GFR: Estimated Creatinine Clearance: 26.1 mL/min (A) (by C-G formula based on SCr of 1.32 mg/dL (H)). Liver Function Tests: No results for input(s): AST, ALT, ALKPHOS, BILITOT, PROT, ALBUMIN in the last 168 hours. No results for input(s): LIPASE, AMYLASE in the last 168 hours. No results for input(s): AMMONIA in the last 168 hours. Coagulation Profile: Recent Labs  Lab 02/07/17 2232  INR 0.96   Cardiac Enzymes: No results for input(s): CKTOTAL, CKMB, CKMBINDEX, TROPONINI in the last 168 hours. BNP (last 3 results) No results for input(s): PROBNP in the last 8760 hours. HbA1C: No results for input(s): HGBA1C in the last 72 hours. CBG: No results for input(s): GLUCAP in the last 168 hours. Lipid Profile: No results for input(s): CHOL, HDL, LDLCALC, TRIG, CHOLHDL, LDLDIRECT in the last 72 hours. Thyroid Function Tests: No results for input(s): TSH, T4TOTAL, FREET4, T3FREE, THYROIDAB in the last 72 hours. Anemia Panel: No results for input(s): VITAMINB12, FOLATE, FERRITIN, TIBC, IRON, RETICCTPCT in the last 72 hours. Urine analysis: No results found for: COLORURINE, APPEARANCEUR, LABSPEC, PHURINE, GLUCOSEU, HGBUR, BILIRUBINUR, KETONESUR, PROTEINUR, UROBILINOGEN, NITRITE, LEUKOCYTESUR   Ripudeep Rai M.D. [facility] 02/10/2017, 12:22 PM  Pager: [phone] Between 7am to 7pm - call Pager -  [phone]  After 7pm go to [email or web address] - password [email or web address]  Call night coverage person covering after 7pm

## 2017-02-10 NOTE — Progress Notes (Signed)
Physical Therapy Treatment Patient Details Name: Julia Mcconnell MRN: [id number] DOB: 1935/02/05 Today's Date: 02/10/2017    History of Present Illness Julia Mcconnell is a 82 y.o. female with medical history significant of HTN, HLD, history of CVA, OSA on CPAP, chronic back pain; who [place] with complaints of left hip pain.  At baseline the patient lives alone and ambulates with the assistance of a cane. Now s/p IM nail, WBAT    PT Comments    Patient progressing slowly with therapy. Pt supine in bed ending first unit of transfusion upon arrival. SpO2=90% BP: 136/65 at beginning of session. Pt with mod encouragement  to participate in therapy session as she was citing pain and fear of movement. Session focused on supine therex and transfer. Unable to stand today due to anxiety and limiting behavior, total A level to transfer to bedside chair. SNF recommendations still appropriate.   Follow Up Recommendations  SNF;Supervision/Assistance - 24 hour     Equipment Recommendations  Rolling walker with 5" wheels;3in1 (PT)    Recommendations for Other Services OT consult     Precautions / Restrictions Precautions Precautions: Fall Restrictions Weight Bearing Restrictions: Yes LLE Weight Bearing: Weight bearing as tolerated    Mobility  Bed Mobility Overal bed mobility: Needs Assistance Bed Mobility: Supine to Sit     Supine to sit: Max assist        Transfers Overall transfer level: Needs assistance Equipment used: 2 person hand held assist Transfers: Squat Pivot Transfers Sit to Stand: Total assist   Squat pivot transfers: Max assist;+2 physical assistance     General transfer comment: Pt limited by pain and anxiety, utilizined bed pad and sling method to bring patient over into bedside chair total A x2  Ambulation/Gait             General Gait Details: unable today   Stairs            Wheelchair Mobility    Modified  Rankin (Stroke Patients Only)       Balance Overall balance assessment: Needs assistance Sitting-balance support: Feet supported Sitting balance-Leahy Scale: Poor Sitting balance - Comments: right lean due to pain on L side                                    Cognition Arousal/Alertness: Awake/alert Behavior During Therapy: Anxious Overall Cognitive Status: Within Functional Limits for tasks assessed                                        Exercises General Exercises - Lower Extremity Ankle Circles/Pumps: 20 reps Quad Sets: 10 reps Heel Slides: 10 reps Hip ABduction/ADduction: 10 reps    General Comments        Pertinent Vitals/Pain Pain Assessment: Faces Faces Pain Scale: Hurts whole lot Pain Location: L hip Pain Descriptors / Indicators: Aching;Sore;Grimacing;Guarding Pain Intervention(s): Limited activity within patient's tolerance;Monitored during session;Premedicated before session    Home Living                      Prior Function            PT Goals (current goals can now be found in the care plan section) Acute Rehab PT Goals Patient Stated Goal: back to  independence PT Goal Formulation: With patient Time For Goal Achievement: 02/23/17 Potential to Achieve Goals: Good Progress towards PT goals: Progressing toward goals    Frequency    Min 3X/week      PT Plan Current plan remains appropriate    Co-evaluation              AM-PAC PT "6 Clicks" Daily Activity  Outcome Measure  Difficulty turning over in bed (including adjusting bedclothes, sheets and blankets)?: Unable Difficulty moving from lying on back to sitting on the side of the bed? : Unable Difficulty sitting down on and standing up from a chair with arms (e.g., wheelchair, bedside commode, etc,.)?: Unable Help needed moving to and from a bed to chair (including a wheelchair)?: Total Help needed walking in hospital room?: Total Help needed  climbing 3-5 steps with a railing? : Total 6 Click Score: 6    End of Session Equipment Utilized During Treatment: Gait belt Activity Tolerance: Patient limited by pain Patient left: in chair;with call bell/phone within reach;with family/visitor present Nurse Communication: Mobility status PT Visit Diagnosis: Unsteadiness on feet (R26.81);Other abnormalities of gait and mobility (R26.89);History of falling (Z91.81);Pain Pain - Right/Left: Left Pain - part of body: Hip     Time: 1510-1535 PT Time Calculation (min) (ACUTE ONLY): 25 min  Charges:  $Therapeutic Exercise: 8-22 mins $Therapeutic Activity: 8-22 mins                    G Codes:       Reinaldo Berber, PT, DPT Acute Rehab Services Pager: [phone]    Reinaldo Berber 02/10/2017, 3:40 PM

## 2017-02-10 NOTE — Progress Notes (Signed)
Pt refused CPAP

## 2017-02-11 DIAGNOSIS — Z8673 Personal history of transient ischemic attack (TIA), and cerebral infarction without residual deficits: Secondary | ICD-10-CM | POA: Diagnosis not present

## 2017-02-11 DIAGNOSIS — S72012S Unspecified intracapsular fracture of left femur, sequela: Secondary | ICD-10-CM | POA: Diagnosis not present

## 2017-02-11 DIAGNOSIS — T847XXA Infection and inflammatory reaction due to other internal orthopedic prosthetic devices, implants and grafts, initial encounter: Secondary | ICD-10-CM | POA: Diagnosis not present

## 2017-02-11 DIAGNOSIS — G8929 Other chronic pain: Secondary | ICD-10-CM | POA: Diagnosis present

## 2017-02-11 DIAGNOSIS — M545 Low back pain: Secondary | ICD-10-CM | POA: Diagnosis present

## 2017-02-11 DIAGNOSIS — R600 Localized edema: Secondary | ICD-10-CM | POA: Diagnosis not present

## 2017-02-11 DIAGNOSIS — S72002A Fracture of unspecified part of neck of left femur, initial encounter for closed fracture: Secondary | ICD-10-CM | POA: Diagnosis not present

## 2017-02-11 DIAGNOSIS — Y831 Surgical operation with implant of artificial internal device as the cause of abnormal reaction of the patient, or of later complication, without mention of misadventure at the time of the procedure: Secondary | ICD-10-CM | POA: Diagnosis present

## 2017-02-11 DIAGNOSIS — T8452XA Infection and inflammatory reaction due to internal left hip prosthesis, initial encounter: Secondary | ICD-10-CM | POA: Diagnosis not present

## 2017-02-11 DIAGNOSIS — E785 Hyperlipidemia, unspecified: Secondary | ICD-10-CM | POA: Diagnosis present

## 2017-02-11 DIAGNOSIS — Z885 Allergy status to narcotic agent status: Secondary | ICD-10-CM | POA: Diagnosis not present

## 2017-02-11 DIAGNOSIS — Z79899 Other long term (current) drug therapy: Secondary | ICD-10-CM | POA: Diagnosis not present

## 2017-02-11 DIAGNOSIS — G8911 Acute pain due to trauma: Secondary | ICD-10-CM | POA: Diagnosis not present

## 2017-02-11 DIAGNOSIS — L03116 Cellulitis of left lower limb: Secondary | ICD-10-CM | POA: Diagnosis present

## 2017-02-11 DIAGNOSIS — R2681 Unsteadiness on feet: Secondary | ICD-10-CM | POA: Diagnosis not present

## 2017-02-11 DIAGNOSIS — I10 Essential (primary) hypertension: Secondary | ICD-10-CM | POA: Diagnosis present

## 2017-02-11 DIAGNOSIS — R262 Difficulty in walking, not elsewhere classified: Secondary | ICD-10-CM | POA: Diagnosis not present

## 2017-02-11 DIAGNOSIS — S7292XA Unspecified fracture of left femur, initial encounter for closed fracture: Secondary | ICD-10-CM | POA: Diagnosis not present

## 2017-02-11 DIAGNOSIS — M810 Age-related osteoporosis without current pathological fracture: Secondary | ICD-10-CM | POA: Diagnosis present

## 2017-02-11 DIAGNOSIS — T84621A Infection and inflammatory reaction due to internal fixation device of left femur, initial encounter: Secondary | ICD-10-CM | POA: Diagnosis present

## 2017-02-11 DIAGNOSIS — T8149XA Infection following a procedure, other surgical site, initial encounter: Secondary | ICD-10-CM | POA: Diagnosis not present

## 2017-02-11 DIAGNOSIS — M1990 Unspecified osteoarthritis, unspecified site: Secondary | ICD-10-CM | POA: Diagnosis present

## 2017-02-11 DIAGNOSIS — S72012D Unspecified intracapsular fracture of left femur, subsequent encounter for closed fracture with routine healing: Secondary | ICD-10-CM | POA: Diagnosis not present

## 2017-02-11 DIAGNOSIS — R2689 Other abnormalities of gait and mobility: Secondary | ICD-10-CM | POA: Diagnosis not present

## 2017-02-11 DIAGNOSIS — I739 Peripheral vascular disease, unspecified: Secondary | ICD-10-CM | POA: Diagnosis present

## 2017-02-11 DIAGNOSIS — Z823 Family history of stroke: Secondary | ICD-10-CM | POA: Diagnosis not present

## 2017-02-11 DIAGNOSIS — Z96652 Presence of left artificial knee joint: Secondary | ICD-10-CM | POA: Diagnosis present

## 2017-02-11 DIAGNOSIS — L02416 Cutaneous abscess of left lower limb: Secondary | ICD-10-CM | POA: Diagnosis present

## 2017-02-11 DIAGNOSIS — M6281 Muscle weakness (generalized): Secondary | ICD-10-CM | POA: Diagnosis not present

## 2017-02-11 DIAGNOSIS — R739 Hyperglycemia, unspecified: Secondary | ICD-10-CM | POA: Diagnosis present

## 2017-02-11 DIAGNOSIS — Z96642 Presence of left artificial hip joint: Secondary | ICD-10-CM | POA: Diagnosis present

## 2017-02-11 DIAGNOSIS — K219 Gastro-esophageal reflux disease without esophagitis: Secondary | ICD-10-CM | POA: Diagnosis present

## 2017-02-11 DIAGNOSIS — Z9889 Other specified postprocedural states: Secondary | ICD-10-CM | POA: Diagnosis not present

## 2017-02-11 DIAGNOSIS — Z4789 Encounter for other orthopedic aftercare: Secondary | ICD-10-CM | POA: Diagnosis not present

## 2017-02-11 DIAGNOSIS — T847XXS Infection and inflammatory reaction due to other internal orthopedic prosthetic devices, implants and grafts, sequela: Secondary | ICD-10-CM | POA: Diagnosis not present

## 2017-02-11 DIAGNOSIS — R488 Other symbolic dysfunctions: Secondary | ICD-10-CM | POA: Diagnosis not present

## 2017-02-11 DIAGNOSIS — G4733 Obstructive sleep apnea (adult) (pediatric): Secondary | ICD-10-CM | POA: Diagnosis present

## 2017-02-11 DIAGNOSIS — M25552 Pain in left hip: Secondary | ICD-10-CM | POA: Diagnosis not present

## 2017-02-11 DIAGNOSIS — M25551 Pain in right hip: Secondary | ICD-10-CM | POA: Diagnosis not present

## 2017-02-11 DIAGNOSIS — G5761 Lesion of plantar nerve, right lower limb: Secondary | ICD-10-CM | POA: Diagnosis not present

## 2017-02-11 DIAGNOSIS — S72302A Unspecified fracture of shaft of left femur, initial encounter for closed fracture: Secondary | ICD-10-CM | POA: Diagnosis not present

## 2017-02-11 LAB — TYPE AND SCREEN
ABO/RH(D): O POS
Antibody Screen: NEGATIVE
UNIT DIVISION: 0
UNIT DIVISION: 0
Unit division: 0
Unit division: 0

## 2017-02-11 LAB — BPAM RBC
BLOOD PRODUCT EXPIRATION DATE: 201902202359
BLOOD PRODUCT EXPIRATION DATE: 201902202359
Blood Product Expiration Date: 201902202359
Blood Product Expiration Date: 201902202359
ISSUE DATE / TIME: 201901292018
ISSUE DATE / TIME: 201901292018
ISSUE DATE / TIME: 201901311651
ISSUE DATE / TIME: 201901311202
UNIT TYPE AND RH: 5100
UNIT TYPE AND RH: 5100
Unit Type and Rh: 5100
Unit Type and Rh: 5100

## 2017-02-11 LAB — CBC
HCT: 26.7 % — ABNORMAL LOW (ref 36.0–46.0)
HEMOGLOBIN: 9.2 g/dL — AB (ref 12.0–15.0)
MCH: 30.7 pg (ref 26.0–34.0)
MCHC: 34.5 g/dL (ref 30.0–36.0)
MCV: 89 fL (ref 78.0–100.0)
PLATELETS: 113 10*3/uL — AB (ref 150–400)
RBC: 3 MIL/uL — AB (ref 3.87–5.11)
RDW: 16 % — ABNORMAL HIGH (ref 11.5–15.5)
WBC: 5.2 10*3/uL (ref 4.0–10.5)

## 2017-02-11 LAB — BASIC METABOLIC PANEL
Anion gap: 5 (ref 5–15)
BUN: 24 mg/dL — ABNORMAL HIGH (ref 6–20)
CHLORIDE: 110 mmol/L (ref 101–111)
CO2: 24 mmol/L (ref 22–32)
Calcium: 8.2 mg/dL — ABNORMAL LOW (ref 8.9–10.3)
Creatinine, Ser: 0.86 mg/dL (ref 0.44–1.00)
GFR calc non Af Amer: >60 mL/min (ref >60)
Glucose, Bld: 96 mg/dL (ref 65–99)
POTASSIUM: 4.2 mmol/L (ref 3.5–5.1)
SODIUM: 139 mmol/L (ref 135–145)

## 2017-02-11 LAB — HEMOGLOBIN AND HEMATOCRIT, BLOOD
HCT: 27.6 % — ABNORMAL LOW (ref 36.0–46.0)
HEMOGLOBIN: 9.7 g/dL — AB (ref 12.0–15.0)

## 2017-02-11 MED ORDER — HYDROCODONE-ACETAMINOPHEN 5-325 MG PO TABS: 1.0000 | ORAL_TABLET | Freq: Four times a day (QID) | ORAL | Status: DC | PRN

## 2017-02-11 MED ORDER — FLEET ENEMA 7-19 GM/118ML RE ENEM: 1.0000 | ENEMA | Freq: Once | RECTAL | Status: DC

## 2017-02-11 MED ORDER — FLEET ENEMA 7-19 GM/118ML RE ENEM
1.0000 | ENEMA | Freq: Once | RECTAL | Status: AC
  Administered 2017-02-11: 1 via RECTAL
  Filled 2017-02-11: qty 1

## 2017-02-11 MED ORDER — PHENOL 1.4 % MT LIQD: 1.0000 | OROMUCOSAL | 0 refills | Status: DC | PRN

## 2017-02-11 MED ORDER — LOSARTAN POTASSIUM 50 MG PO TABS: 50.0000 mg | ORAL_TABLET | Freq: Every day | ORAL | 3 refills | Status: DC

## 2017-02-11 MED ORDER — POLYETHYLENE GLYCOL 3350 17 G PO PACK: 17.0000 g | PACK | Freq: Every day | ORAL | 0 refills | Status: DC | PRN

## 2017-02-11 MED ORDER — SENNOSIDES-DOCUSATE SODIUM 8.6-50 MG PO TABS: 1.0000 | ORAL_TABLET | Freq: Two times a day (BID) | ORAL | Status: DC

## 2017-02-11 MED ORDER — CARVEDILOL 3.125 MG PO TABS: 3.1250 mg | ORAL_TABLET | Freq: Two times a day (BID) | ORAL | Status: DC

## 2017-02-11 NOTE — Progress Notes (Signed)
Discharge report given to [facility] (nurse) at [facility].

## 2017-02-11 NOTE — Clinical Social Work Placement (Signed)
   CLINICAL SOCIAL WORK PLACEMENT  NOTE  Date:  02/11/2017  Patient Details  Name: Julia Mcconnell MRN: [id number] Date of Birth: July 19, 1935  Clinical Social Work is seeking post-discharge placement for this patient at the [place] level of care (*CSW will initial, date and re-position this form in  chart as items are completed):  Yes   Patient/family provided with [place] Work Department's list of facilities offering this level of care within the geographic area requested by the patient (or if unable, by the patient's family).  Yes   Patient/family informed of their freedom to choose among providers that offer the needed level of care, that participate in Medicare, Medicaid or managed care program needed by the patient, have an available bed and are willing to accept the patient.  Yes   Patient/family informed of [place]'s ownership interest in [facility] and [facility], as well as of the fact that they are under no obligation to receive care at these facilities.  PASRR submitted to EDS on       PASRR number received on       Existing PASRR number confirmed on 02/09/17     FL2 transmitted to all facilities in geographic area requested by pt/family on       FL2 transmitted to all facilities within larger geographic area on 02/02/17     Patient informed that his/her managed care company has contracts with or will negotiate with certain facilities, including the following:        Yes   Patient/family informed of bed offers received.  Patient chooses bed at [facility]     Physician recommends and patient chooses bed at      Patient to be transferred to [facility] on 02/11/17.  Patient to be transferred to facility by [facility]     Patient family notified on 02/11/17 of transfer.  Name of family member notified:  pt responsible for self     PHYSICIAN       Additional Comment:     _______________________________________________ Normajean Baxter, LCSW 02/11/2017, 11:41 AM
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
## 2017-02-11 NOTE — Social Work (Addendum)
Clinical Social Worker facilitated patient discharge including contacting patient family and facility to confirm patient discharge plans.  Clinical information faxed to facility and family agreeable with plan.    CSW arranged ambulance transport via [facility] to [facility].    RN to call [phone] to give report prior to discharge. Pt going to Room 401.  Clinical Social Worker will sign off for now as social work intervention is no longer needed. Please consult [place] again if new need arises.  Elissa Hefty, LCSW Clinical Social Worker [phone]

## 2017-02-11 NOTE — Social Work (Signed)
Pt has SNF bed at [facility].  CSW will f/u for discharge.  Elissa Hefty, LCSW Clinical Social Worker [phone]

## 2017-02-14 DIAGNOSIS — I10 Essential (primary) hypertension: Secondary | ICD-10-CM | POA: Diagnosis not present

## 2017-02-14 DIAGNOSIS — M25552 Pain in left hip: Secondary | ICD-10-CM | POA: Diagnosis not present

## 2017-02-14 DIAGNOSIS — G5761 Lesion of plantar nerve, right lower limb: Secondary | ICD-10-CM | POA: Diagnosis not present

## 2017-02-14 DIAGNOSIS — R600 Localized edema: Secondary | ICD-10-CM | POA: Diagnosis not present

## 2017-02-14 DIAGNOSIS — S7292XA Unspecified fracture of left femur, initial encounter for closed fracture: Secondary | ICD-10-CM | POA: Diagnosis not present

## 2017-02-14 DIAGNOSIS — Z8673 Personal history of transient ischemic attack (TIA), and cerebral infarction without residual deficits: Secondary | ICD-10-CM | POA: Diagnosis not present

## 2017-02-14 DIAGNOSIS — S72002A Fracture of unspecified part of neck of left femur, initial encounter for closed fracture: Secondary | ICD-10-CM | POA: Diagnosis not present

## 2017-02-14 DIAGNOSIS — R2681 Unsteadiness on feet: Secondary | ICD-10-CM | POA: Diagnosis not present

## 2017-02-16 DIAGNOSIS — R600 Localized edema: Secondary | ICD-10-CM | POA: Diagnosis not present

## 2017-02-16 DIAGNOSIS — M25552 Pain in left hip: Secondary | ICD-10-CM | POA: Diagnosis not present

## 2017-02-16 DIAGNOSIS — Z9889 Other specified postprocedural states: Secondary | ICD-10-CM | POA: Diagnosis not present

## 2017-02-16 DIAGNOSIS — S7292XA Unspecified fracture of left femur, initial encounter for closed fracture: Secondary | ICD-10-CM | POA: Diagnosis not present

## 2017-02-16 DIAGNOSIS — G5761 Lesion of plantar nerve, right lower limb: Secondary | ICD-10-CM | POA: Diagnosis not present

## 2017-02-16 DIAGNOSIS — R2681 Unsteadiness on feet: Secondary | ICD-10-CM | POA: Diagnosis not present

## 2017-02-16 DIAGNOSIS — Z8673 Personal history of transient ischemic attack (TIA), and cerebral infarction without residual deficits: Secondary | ICD-10-CM | POA: Diagnosis not present

## 2017-02-16 DIAGNOSIS — I10 Essential (primary) hypertension: Secondary | ICD-10-CM | POA: Diagnosis not present

## 2017-02-18 DIAGNOSIS — M25552 Pain in left hip: Secondary | ICD-10-CM | POA: Diagnosis not present

## 2017-02-18 DIAGNOSIS — R600 Localized edema: Secondary | ICD-10-CM | POA: Diagnosis not present

## 2017-02-18 DIAGNOSIS — G5761 Lesion of plantar nerve, right lower limb: Secondary | ICD-10-CM | POA: Diagnosis not present

## 2017-02-18 DIAGNOSIS — R2681 Unsteadiness on feet: Secondary | ICD-10-CM | POA: Diagnosis not present

## 2017-02-21 ENCOUNTER — Ambulatory Visit (INDEPENDENT_AMBULATORY_CARE_PROVIDER_SITE_OTHER): Payer: Medicare Other | Admitting: Family

## 2017-02-21 ENCOUNTER — Ambulatory Visit (INDEPENDENT_AMBULATORY_CARE_PROVIDER_SITE_OTHER): Payer: Medicare Other

## 2017-02-21 ENCOUNTER — Encounter (INDEPENDENT_AMBULATORY_CARE_PROVIDER_SITE_OTHER): Payer: Self-pay | Admitting: Family

## 2017-02-21 DIAGNOSIS — S72002D Fracture of unspecified part of neck of left femur, subsequent encounter for closed fracture with routine healing: Secondary | ICD-10-CM
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
## 2017-02-22 DIAGNOSIS — R600 Localized edema: Secondary | ICD-10-CM | POA: Diagnosis not present

## 2017-02-22 DIAGNOSIS — G5761 Lesion of plantar nerve, right lower limb: Secondary | ICD-10-CM | POA: Diagnosis not present

## 2017-02-22 DIAGNOSIS — Z9889 Other specified postprocedural states: Secondary | ICD-10-CM | POA: Diagnosis not present

## 2017-02-22 DIAGNOSIS — R2681 Unsteadiness on feet: Secondary | ICD-10-CM | POA: Diagnosis not present

## 2017-02-22 DIAGNOSIS — I10 Essential (primary) hypertension: Secondary | ICD-10-CM | POA: Diagnosis not present

## 2017-02-22 DIAGNOSIS — M25552 Pain in left hip: Secondary | ICD-10-CM | POA: Diagnosis not present

## 2017-02-22 DIAGNOSIS — S7292XA Unspecified fracture of left femur, initial encounter for closed fracture: Secondary | ICD-10-CM | POA: Diagnosis not present

## 2017-02-24 DIAGNOSIS — R600 Localized edema: Secondary | ICD-10-CM | POA: Diagnosis not present

## 2017-02-24 DIAGNOSIS — M25552 Pain in left hip: Secondary | ICD-10-CM | POA: Diagnosis not present

## 2017-02-24 DIAGNOSIS — R2681 Unsteadiness on feet: Secondary | ICD-10-CM | POA: Diagnosis not present

## 2017-02-24 DIAGNOSIS — G5761 Lesion of plantar nerve, right lower limb: Secondary | ICD-10-CM | POA: Diagnosis not present

## 2017-02-28 DIAGNOSIS — R600 Localized edema: Secondary | ICD-10-CM | POA: Diagnosis not present

## 2017-02-28 DIAGNOSIS — R2681 Unsteadiness on feet: Secondary | ICD-10-CM | POA: Diagnosis not present

## 2017-02-28 DIAGNOSIS — M25552 Pain in left hip: Secondary | ICD-10-CM | POA: Diagnosis not present

## 2017-02-28 DIAGNOSIS — G5761 Lesion of plantar nerve, right lower limb: Secondary | ICD-10-CM | POA: Diagnosis not present

## 2017-03-01 DIAGNOSIS — R600 Localized edema: Secondary | ICD-10-CM | POA: Diagnosis not present

## 2017-03-01 DIAGNOSIS — I10 Essential (primary) hypertension: Secondary | ICD-10-CM | POA: Diagnosis not present

## 2017-03-01 DIAGNOSIS — S7292XA Unspecified fracture of left femur, initial encounter for closed fracture: Secondary | ICD-10-CM | POA: Diagnosis not present

## 2017-03-01 DIAGNOSIS — S72002A Fracture of unspecified part of neck of left femur, initial encounter for closed fracture: Secondary | ICD-10-CM | POA: Diagnosis not present

## 2017-03-04 DIAGNOSIS — R600 Localized edema: Secondary | ICD-10-CM | POA: Diagnosis not present

## 2017-03-04 DIAGNOSIS — R2681 Unsteadiness on feet: Secondary | ICD-10-CM | POA: Diagnosis not present

## 2017-03-04 DIAGNOSIS — M25552 Pain in left hip: Secondary | ICD-10-CM | POA: Diagnosis not present

## 2017-03-04 DIAGNOSIS — G5761 Lesion of plantar nerve, right lower limb: Secondary | ICD-10-CM | POA: Diagnosis not present

## 2017-03-07 ENCOUNTER — Ambulatory Visit (INDEPENDENT_AMBULATORY_CARE_PROVIDER_SITE_OTHER): Payer: No Typology Code available for payment source | Admitting: Orthopedic Surgery

## 2017-03-07 ENCOUNTER — Encounter (INDEPENDENT_AMBULATORY_CARE_PROVIDER_SITE_OTHER): Payer: Self-pay | Admitting: Orthopedic Surgery

## 2017-03-07 ENCOUNTER — Ambulatory Visit (INDEPENDENT_AMBULATORY_CARE_PROVIDER_SITE_OTHER): Payer: No Typology Code available for payment source

## 2017-03-07 ENCOUNTER — Other Ambulatory Visit (INDEPENDENT_AMBULATORY_CARE_PROVIDER_SITE_OTHER): Payer: Self-pay | Admitting: Orthopedic Surgery

## 2017-03-07 ENCOUNTER — Telehealth (INDEPENDENT_AMBULATORY_CARE_PROVIDER_SITE_OTHER): Payer: Self-pay | Admitting: *Deleted

## 2017-03-07 VITALS — Ht <= 58 in | Wt 128.0 lb

## 2017-03-07 DIAGNOSIS — T847XXS Infection and inflammatory reaction due to other internal orthopedic prosthetic devices, implants and grafts, sequela: Secondary | ICD-10-CM

## 2017-03-07 DIAGNOSIS — R600 Localized edema: Secondary | ICD-10-CM | POA: Diagnosis not present

## 2017-03-07 DIAGNOSIS — T847XXA Infection and inflammatory reaction due to other internal orthopedic prosthetic devices, implants and grafts, initial encounter: Secondary | ICD-10-CM | POA: Insufficient documentation

## 2017-03-07 DIAGNOSIS — S72002A Fracture of unspecified part of neck of left femur, initial encounter for closed fracture: Secondary | ICD-10-CM | POA: Diagnosis not present

## 2017-03-07 DIAGNOSIS — T8149XA Infection following a procedure, other surgical site, initial encounter: Secondary | ICD-10-CM | POA: Diagnosis not present

## 2017-03-07 DIAGNOSIS — Z9889 Other specified postprocedural states: Secondary | ICD-10-CM | POA: Diagnosis not present
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
## 2017-03-07 NOTE — Telephone Encounter (Signed)
Tonya nurse from [facility] and rehab called stating pt has a Left hip incision 2 weeks ago and was changing dressing and now pt has bleeding and is saturating dressing. Treatment nurse states pt has a 6cm by 6cm hollow pocket and would like for pt to be seen ASAP. I called Autumn with Dr. Sharol Given office and asked if could schedule pt sooner and she recommended pt to be seen today. Pt is on schedule for today at 130pm with Dr. Sharol Given with ok by Autumn.

## 2017-03-07 NOTE — Progress Notes (Addendum)
Spoke to Karna Christmas, Therapist, sports at [facility]. Requested that patient arrive at 1500; NPO past midnight; tylenol and carvedilol with sip of water; send MAR with last doses and list of medical history. Dr. Tobias Alexander made aware of cardiac history with no further orders.

## 2017-03-08 NOTE — Pre-Procedure Instructions (Signed)
    Julia Mcconnell  03/08/2017      [place], [place] - [address] [place] [address] Phone: [phone] Fax: [phone]  [id number] - [place], [place] [address] [place] [address] Phone: [phone] Fax: [phone]    Your procedure is scheduled on Wednesday, March 09, 2017  Report to [facility] Admitting at 8:20 A.M.  Call this number if you have problems the morning of surgery:  [phone]   Remember:  Do not eat food or drink liquids after midnight.  Take these medicines the morning of surgery with A SIP OF WATER: carvedilol (COREG), if needed: acetaminophen (TYLENOL) for mild pain or headache. Stop taking vitamins, fish oil and herbal medications. Do not take any NSAIDs ie: Ibuprofen, Advil, Naproxen (Aleve), Motrin, BC and Goody Powder; stop now.  Do not wear jewelry, make-up or nail polish.  Do not wear lotions, powders, or perfumes, or deodorant.  Do not shave 48 hours prior to surgery.    Do not bring valuables to the hospital.  [facility] is not responsible for any belongings or valuables.  Contacts, dentures or bridgework may not be worn into surgery.  Leave your suitcase in the car.  After surgery it may be brought to your room. For patients admitted to the hospital, discharge time will be determined by your treatment team. Patients discharged the day of surgery will not be allowed to drive home.  Please read over the following fact sheets that you were given.

## 2017-03-08 NOTE — Progress Notes (Signed)
Julia Mcconnell at [facility] notified for patient to arrive at [place] and to follow instructions from my previous not which she states she has.

## 2017-03-09 ENCOUNTER — Inpatient Hospital Stay (HOSPITAL_COMMUNITY): Payer: Medicare Other | Admitting: Anesthesiology

## 2017-03-09 ENCOUNTER — Encounter (HOSPITAL_COMMUNITY)
Admission: RE | Disposition: A | Discharge status: Skilled Nursing Facility | Payer: Self-pay | Source: Ambulatory Visit | Attending: Orthopedic Surgery

## 2017-03-09 ENCOUNTER — Inpatient Hospital Stay (HOSPITAL_COMMUNITY)
Admission: RE | Admit: 2017-03-09 | Discharge: 2017-03-14 | DRG: 464 | Disposition: A | Discharge status: Skilled Nursing Facility | Payer: Medicare Other | Source: Ambulatory Visit | Attending: Orthopedic Surgery | Admitting: Orthopedic Surgery

## 2017-03-09 ENCOUNTER — Encounter (HOSPITAL_COMMUNITY): Payer: Self-pay | Admitting: *Deleted

## 2017-03-09 ENCOUNTER — Inpatient Hospital Stay (HOSPITAL_COMMUNITY): Payer: Medicare Other

## 2017-03-09 ENCOUNTER — Other Ambulatory Visit: Payer: Self-pay

## 2017-03-09 DIAGNOSIS — Z8673 Personal history of transient ischemic attack (TIA), and cerebral infarction without residual deficits: Secondary | ICD-10-CM

## 2017-03-09 DIAGNOSIS — M545 Low back pain: Secondary | ICD-10-CM | POA: Diagnosis present

## 2017-03-09 DIAGNOSIS — I739 Peripheral vascular disease, unspecified: Secondary | ICD-10-CM | POA: Diagnosis present

## 2017-03-09 DIAGNOSIS — L03116 Cellulitis of left lower limb: Secondary | ICD-10-CM | POA: Diagnosis present

## 2017-03-09 DIAGNOSIS — Z96652 Presence of left artificial knee joint: Secondary | ICD-10-CM | POA: Diagnosis present

## 2017-03-09 DIAGNOSIS — M810 Age-related osteoporosis without current pathological fracture: Secondary | ICD-10-CM | POA: Diagnosis present

## 2017-03-09 DIAGNOSIS — R739 Hyperglycemia, unspecified: Secondary | ICD-10-CM | POA: Diagnosis present

## 2017-03-09 DIAGNOSIS — M1990 Unspecified osteoarthritis, unspecified site: Secondary | ICD-10-CM | POA: Diagnosis present

## 2017-03-09 DIAGNOSIS — L02416 Cutaneous abscess of left lower limb: Secondary | ICD-10-CM | POA: Diagnosis present

## 2017-03-09 DIAGNOSIS — I10 Essential (primary) hypertension: Secondary | ICD-10-CM | POA: Diagnosis present

## 2017-03-09 DIAGNOSIS — Z96642 Presence of left artificial hip joint: Secondary | ICD-10-CM | POA: Diagnosis present

## 2017-03-09 DIAGNOSIS — E785 Hyperlipidemia, unspecified: Secondary | ICD-10-CM | POA: Diagnosis present

## 2017-03-09 DIAGNOSIS — Y831 Surgical operation with implant of artificial internal device as the cause of abnormal reaction of the patient, or of later complication, without mention of misadventure at the time of the procedure: Secondary | ICD-10-CM | POA: Diagnosis present

## 2017-03-09 DIAGNOSIS — G8929 Other chronic pain: Secondary | ICD-10-CM | POA: Diagnosis present

## 2017-03-09 DIAGNOSIS — K219 Gastro-esophageal reflux disease without esophagitis: Secondary | ICD-10-CM | POA: Diagnosis present

## 2017-03-09 DIAGNOSIS — Z885 Allergy status to narcotic agent status: Secondary | ICD-10-CM

## 2017-03-09 DIAGNOSIS — Z823 Family history of stroke: Secondary | ICD-10-CM

## 2017-03-09 DIAGNOSIS — T84621A Infection and inflammatory reaction due to internal fixation device of left femur, initial encounter: Principal | ICD-10-CM | POA: Diagnosis present

## 2017-03-09 DIAGNOSIS — G4733 Obstructive sleep apnea (adult) (pediatric): Secondary | ICD-10-CM | POA: Diagnosis present

## 2017-03-09 DIAGNOSIS — T847XXA Infection and inflammatory reaction due to other internal orthopedic prosthetic devices, implants and grafts, initial encounter: Secondary | ICD-10-CM

## 2017-03-09 DIAGNOSIS — T847XXS Infection and inflammatory reaction due to other internal orthopedic prosthetic devices, implants and grafts, sequela: Secondary | ICD-10-CM

## 2017-03-09 HISTORY — PX: HARDWARE REMOVAL: SHX979

## 2017-03-09 LAB — COMPREHENSIVE METABOLIC PANEL
ALT: 13 U/L — AB (ref 14–54)
AST: 19 U/L (ref 15–41)
Albumin: 3.2 g/dL — ABNORMAL LOW (ref 3.5–5.0)
Alkaline Phosphatase: 227 U/L — ABNORMAL HIGH (ref 38–126)
Anion gap: 13 (ref 5–15)
BILIRUBIN TOTAL: 0.7 mg/dL (ref 0.3–1.2)
BUN: 9 mg/dL (ref 6–20)
CALCIUM: 8.6 mg/dL — AB (ref 8.9–10.3)
CO2: 25 mmol/L (ref 22–32)
CREATININE: 0.66 mg/dL (ref 0.44–1.00)
Chloride: 100 mmol/L — ABNORMAL LOW (ref 101–111)
GFR calc Af Amer: >60 mL/min (ref >60)
Glucose, Bld: 84 mg/dL (ref 65–99)
Potassium: 3.3 mmol/L — ABNORMAL LOW (ref 3.5–5.1)
Sodium: 138 mmol/L (ref 135–145)
TOTAL PROTEIN: 6.7 g/dL (ref 6.5–8.1)

## 2017-03-09 LAB — CBC
HEMATOCRIT: 31.7 % — AB (ref 36.0–46.0)
Hemoglobin: 10.1 g/dL — ABNORMAL LOW (ref 12.0–15.0)
MCH: 31.4 pg (ref 26.0–34.0)
MCHC: 31.9 g/dL (ref 30.0–36.0)
MCV: 98.4 fL (ref 78.0–100.0)
Platelets: 333 10*3/uL (ref 150–400)
RBC: 3.22 MIL/uL — AB (ref 3.87–5.11)
RDW: 17.5 % — ABNORMAL HIGH (ref 11.5–15.5)
WBC: 4.6 10*3/uL (ref 4.0–10.5)

## 2017-03-09 LAB — PROTIME-INR
INR: 1.05
PROTHROMBIN TIME: 13.6 s (ref 11.4–15.2)

## 2017-03-09 LAB — APTT: aPTT: 40 seconds — ABNORMAL HIGH (ref 24–36)

## 2017-03-09 SURGERY — REMOVAL, HARDWARE
Anesthesia: General | Site: Hip | Laterality: Left

## 2017-03-09 MED ORDER — FENTANYL CITRATE (PF) 100 MCG/2ML IJ SOLN
INTRAMUSCULAR | Status: AC
  Filled 2017-03-09: qty 2

## 2017-03-09 MED ORDER — MIDAZOLAM HCL 2 MG/2ML IJ SOLN
INTRAMUSCULAR | Status: AC
  Filled 2017-03-09: qty 2

## 2017-03-09 MED ORDER — METOCLOPRAMIDE HCL 5 MG/ML IJ SOLN: 5.0000 mg | Freq: Three times a day (TID) | INTRAMUSCULAR | Status: DC | PRN

## 2017-03-09 MED ORDER — METHOCARBAMOL 500 MG PO TABS
500.0000 mg | ORAL_TABLET | Freq: Four times a day (QID) | ORAL | Status: DC | PRN
Start: 2017-03-09 — End: 2017-03-14
  Administered 2017-03-10 – 2017-03-14 (×7): 500 mg via ORAL
  Filled 2017-03-09 (×8): qty 1

## 2017-03-09 MED ORDER — HYDRALAZINE HCL 20 MG/ML IJ SOLN
INTRAMUSCULAR | Status: AC
  Administered 2017-03-09: 10 mg via INTRAVENOUS
  Filled 2017-03-09: qty 1

## 2017-03-09 MED ORDER — ONDANSETRON HCL 4 MG/2ML IJ SOLN: 4.0000 mg | Freq: Four times a day (QID) | INTRAMUSCULAR | Status: DC | PRN

## 2017-03-09 MED ORDER — MORPHINE SULFATE (PF) 2 MG/ML IV SOLN: 0.5000 mg | INTRAVENOUS | Status: DC | PRN

## 2017-03-09 MED ORDER — 0.9 % SODIUM CHLORIDE (POUR BTL) OPTIME
TOPICAL | Status: DC | PRN
  Administered 2017-03-09: 1000 mL

## 2017-03-09 MED ORDER — FENTANYL CITRATE (PF) 100 MCG/2ML IJ SOLN
25.0000 ug | INTRAMUSCULAR | Status: DC | PRN
  Administered 2017-03-09: 25 ug via INTRAVENOUS

## 2017-03-09 MED ORDER — LIDOCAINE 2% (20 MG/ML) 5 ML SYRINGE
INTRAMUSCULAR | Status: DC | PRN
  Administered 2017-03-09: 60 mg via INTRAVENOUS

## 2017-03-09 MED ORDER — ALBUMIN HUMAN 5 % IV SOLN
12.5000 g | Freq: Once | INTRAVENOUS | Status: AC
  Administered 2017-03-09: 12.5 g via INTRAVENOUS

## 2017-03-09 MED ORDER — DIPHENHYDRAMINE HCL 50 MG/ML IJ SOLN
INTRAMUSCULAR | Status: DC | PRN
  Administered 2017-03-09: 12.5 mg via INTRAVENOUS

## 2017-03-09 MED ORDER — SODIUM CHLORIDE 0.9 % IV SOLN: INTRAVENOUS | Status: DC

## 2017-03-09 MED ORDER — HYDROCODONE-ACETAMINOPHEN 5-325 MG PO TABS
1.0000 | ORAL_TABLET | ORAL | Status: DC | PRN
  Administered 2017-03-09 – 2017-03-11 (×6): 2 via ORAL
  Administered 2017-03-12: 1 via ORAL
  Administered 2017-03-13: 2 via ORAL
  Administered 2017-03-13: 1 via ORAL
  Administered 2017-03-14: 2 via ORAL
  Filled 2017-03-09 (×9): qty 2
  Filled 2017-03-09: qty 1
  Filled 2017-03-09: qty 2
  Filled 2017-03-09: qty 1

## 2017-03-09 MED ORDER — BISACODYL 10 MG RE SUPP
10.0000 mg | Freq: Every day | RECTAL | Status: DC | PRN
Start: 2017-03-09 — End: 2017-03-14

## 2017-03-09 MED ORDER — PROPOFOL 10 MG/ML IV BOLUS
INTRAVENOUS | Status: AC
  Filled 2017-03-09: qty 20

## 2017-03-09 MED ORDER — PHENYLEPHRINE 40 MCG/ML (10ML) SYRINGE FOR IV PUSH (FOR BLOOD PRESSURE SUPPORT)
PREFILLED_SYRINGE | INTRAVENOUS | Status: DC | PRN
  Administered 2017-03-09 (×5): 80 ug via INTRAVENOUS

## 2017-03-09 MED ORDER — OXYCODONE HCL 5 MG/5ML PO SOLN: 5.0000 mg | Freq: Once | ORAL | Status: DC | PRN

## 2017-03-09 MED ORDER — ONDANSETRON HCL 4 MG/2ML IJ SOLN: 4.0000 mg | Freq: Once | INTRAMUSCULAR | Status: DC | PRN

## 2017-03-09 MED ORDER — LIDOCAINE 2% (20 MG/ML) 5 ML SYRINGE
INTRAMUSCULAR | Status: AC
  Filled 2017-03-09: qty 5

## 2017-03-09 MED ORDER — POLYETHYLENE GLYCOL 3350 17 G PO PACK
17.0000 g | PACK | Freq: Every day | ORAL | Status: DC | PRN
  Administered 2017-03-10: 17 g via ORAL
  Filled 2017-03-09: qty 1

## 2017-03-09 MED ORDER — EPHEDRINE 5 MG/ML INJ
INTRAVENOUS | Status: AC
  Filled 2017-03-09: qty 10

## 2017-03-09 MED ORDER — CARVEDILOL 3.125 MG PO TABS
3.1250 mg | ORAL_TABLET | Freq: Once | ORAL | Status: AC
  Administered 2017-03-09: 3.125 mg via ORAL

## 2017-03-09 MED ORDER — FENTANYL CITRATE (PF) 250 MCG/5ML IJ SOLN
INTRAMUSCULAR | Status: DC | PRN
  Administered 2017-03-09 (×2): 50 ug via INTRAVENOUS
  Administered 2017-03-09 (×2): 25 ug via INTRAVENOUS

## 2017-03-09 MED ORDER — ACETAMINOPHEN 325 MG PO TABS: 325.0000 mg | ORAL_TABLET | Freq: Four times a day (QID) | ORAL | Status: DC | PRN

## 2017-03-09 MED ORDER — CHLORHEXIDINE GLUCONATE 4 % EX LIQD: 60.0000 mL | Freq: Once | CUTANEOUS | Status: DC

## 2017-03-09 MED ORDER — ASPIRIN EC 81 MG PO TBEC
81.0000 mg | DELAYED_RELEASE_TABLET | Freq: Every day | ORAL | Status: DC
  Administered 2017-03-09 – 2017-03-14 (×6): 81 mg via ORAL
  Filled 2017-03-09 (×6): qty 1

## 2017-03-09 MED ORDER — DEXAMETHASONE SODIUM PHOSPHATE 10 MG/ML IJ SOLN
INTRAMUSCULAR | Status: AC
  Filled 2017-03-09: qty 1

## 2017-03-09 MED ORDER — FENTANYL CITRATE (PF) 250 MCG/5ML IJ SOLN
INTRAMUSCULAR | Status: AC
  Filled 2017-03-09: qty 5

## 2017-03-09 MED ORDER — PHENYLEPHRINE 40 MCG/ML (10ML) SYRINGE FOR IV PUSH (FOR BLOOD PRESSURE SUPPORT)
PREFILLED_SYRINGE | INTRAVENOUS | Status: AC
  Filled 2017-03-09: qty 10

## 2017-03-09 MED ORDER — TRAMADOL HCL 50 MG PO TABS: 50.0000 mg | ORAL_TABLET | Freq: Four times a day (QID) | ORAL | Status: DC | PRN

## 2017-03-09 MED ORDER — CEFAZOLIN SODIUM-DEXTROSE 2-4 GM/100ML-% IV SOLN
2.0000 g | INTRAVENOUS | Status: AC
  Administered 2017-03-09: 2 g via INTRAVENOUS
  Filled 2017-03-09: qty 100

## 2017-03-09 MED ORDER — HYDROCODONE-ACETAMINOPHEN 7.5-325 MG PO TABS
1.0000 | ORAL_TABLET | ORAL | Status: DC | PRN
  Administered 2017-03-14: 2 via ORAL

## 2017-03-09 MED ORDER — GENTAMICIN SULFATE 40 MG/ML IJ SOLN
INTRAMUSCULAR | Status: DC | PRN
  Administered 2017-03-09: 240 mg

## 2017-03-09 MED ORDER — DEXAMETHASONE SODIUM PHOSPHATE 10 MG/ML IJ SOLN
INTRAMUSCULAR | Status: DC | PRN
  Administered 2017-03-09: 5 mg via INTRAVENOUS

## 2017-03-09 MED ORDER — ATORVASTATIN CALCIUM 20 MG PO TABS
20.0000 mg | ORAL_TABLET | Freq: Every day | ORAL | Status: DC
  Administered 2017-03-09 – 2017-03-13 (×5): 20 mg via ORAL
  Filled 2017-03-09 (×5): qty 1

## 2017-03-09 MED ORDER — METHOCARBAMOL 1000 MG/10ML IJ SOLN
500.0000 mg | Freq: Four times a day (QID) | INTRAVENOUS | Status: DC | PRN
  Filled 2017-03-09: qty 5

## 2017-03-09 MED ORDER — HYDRALAZINE HCL 20 MG/ML IJ SOLN
10.0000 mg | Freq: Once | INTRAMUSCULAR | Status: AC
  Administered 2017-03-09: 10 mg via INTRAVENOUS

## 2017-03-09 MED ORDER — CEFAZOLIN SODIUM-DEXTROSE 1-4 GM/50ML-% IV SOLN
1.0000 g | Freq: Four times a day (QID) | INTRAVENOUS | Status: DC
  Administered 2017-03-09 – 2017-03-11 (×7): 1 g via INTRAVENOUS
  Filled 2017-03-09 (×9): qty 50

## 2017-03-09 MED ORDER — METOCLOPRAMIDE HCL 5 MG PO TABS: 5.0000 mg | ORAL_TABLET | Freq: Three times a day (TID) | ORAL | Status: DC | PRN

## 2017-03-09 MED ORDER — VANCOMYCIN HCL 500 MG IV SOLR
INTRAVENOUS | Status: DC | PRN
  Administered 2017-03-09: 500 mg

## 2017-03-09 MED ORDER — ALBUMIN HUMAN 5 % IV SOLN
INTRAVENOUS | Status: AC
Start: 2017-03-09 — End: 2017-03-10
  Filled 2017-03-09: qty 250

## 2017-03-09 MED ORDER — ONDANSETRON HCL 4 MG PO TABS: 4.0000 mg | ORAL_TABLET | Freq: Four times a day (QID) | ORAL | Status: DC | PRN

## 2017-03-09 MED ORDER — OXYCODONE HCL 5 MG PO TABS: 5.0000 mg | ORAL_TABLET | Freq: Once | ORAL | Status: DC | PRN

## 2017-03-09 MED ORDER — LACTATED RINGERS IV SOLN
INTRAVENOUS | Status: DC
  Administered 2017-03-09 (×2): via INTRAVENOUS

## 2017-03-09 MED ORDER — CLOPIDOGREL BISULFATE 75 MG PO TABS
75.0000 mg | ORAL_TABLET | Freq: Every day | ORAL | Status: DC
  Administered 2017-03-09 – 2017-03-14 (×6): 75 mg via ORAL
  Filled 2017-03-09 (×6): qty 1

## 2017-03-09 MED ORDER — DIPHENHYDRAMINE HCL 50 MG/ML IJ SOLN
INTRAMUSCULAR | Status: AC
  Filled 2017-03-09: qty 1

## 2017-03-09 MED ORDER — EPHEDRINE SULFATE-NACL 50-0.9 MG/10ML-% IV SOSY
PREFILLED_SYRINGE | INTRAVENOUS | Status: DC | PRN
  Administered 2017-03-09: 5 mg via INTRAVENOUS

## 2017-03-09 MED ORDER — MAGNESIUM CITRATE PO SOLN: 1.0000 | Freq: Once | ORAL | Status: DC | PRN

## 2017-03-09 MED ORDER — DOCUSATE SODIUM 100 MG PO CAPS
100.0000 mg | ORAL_CAPSULE | Freq: Two times a day (BID) | ORAL | Status: DC
  Administered 2017-03-09 – 2017-03-14 (×9): 100 mg via ORAL
  Filled 2017-03-09 (×9): qty 1

## 2017-03-09 MED ORDER — LOSARTAN POTASSIUM 50 MG PO TABS
50.0000 mg | ORAL_TABLET | Freq: Every day | ORAL | Status: DC
  Administered 2017-03-09 – 2017-03-14 (×6): 50 mg via ORAL
  Filled 2017-03-09 (×6): qty 1

## 2017-03-09 MED ORDER — PROPOFOL 10 MG/ML IV BOLUS
INTRAVENOUS | Status: DC | PRN
  Administered 2017-03-09: 100 mg via INTRAVENOUS

## 2017-03-09 MED ORDER — CARVEDILOL 3.125 MG PO TABS
ORAL_TABLET | ORAL | Status: AC
  Administered 2017-03-09: 3.125 mg via ORAL
  Filled 2017-03-09: qty 1

## 2017-03-09 MED ORDER — ONDANSETRON HCL 4 MG/2ML IJ SOLN
INTRAMUSCULAR | Status: DC | PRN
  Administered 2017-03-09: 4 mg via INTRAVENOUS

## 2017-03-09 MED ORDER — CARVEDILOL 3.125 MG PO TABS
3.1250 mg | ORAL_TABLET | Freq: Two times a day (BID) | ORAL | Status: DC
  Administered 2017-03-10 – 2017-03-14 (×9): 3.125 mg via ORAL
  Filled 2017-03-09 (×10): qty 1

## 2017-03-09 SURGICAL SUPPLY — 48 items
BANDAGE ESMARK 6X9 LF (GAUZE/BANDAGES/DRESSINGS) IMPLANT
BNDG CMPR 9X6 STRL LF SNTH (GAUZE/BANDAGES/DRESSINGS)
BNDG COHESIVE 4X5 TAN STRL (GAUZE/BANDAGES/DRESSINGS) IMPLANT
BNDG ESMARK 6X9 LF (GAUZE/BANDAGES/DRESSINGS)
BNDG GAUZE ELAST 4 BULKY (GAUZE/BANDAGES/DRESSINGS) ×3 IMPLANT
COVER SURGICAL LIGHT HANDLE (MISCELLANEOUS) ×6 IMPLANT
CUFF TOURNIQUET SINGLE 34IN LL (TOURNIQUET CUFF) IMPLANT
CUFF TOURNIQUET SINGLE 44IN (TOURNIQUET CUFF) IMPLANT
DRAPE C-ARM 42X72 X-RAY (DRAPES) IMPLANT
DRAPE INCISE IOBAN 66X45 STRL (DRAPES) ×2 IMPLANT
DRAPE ORTHO SPLIT 77X108 STRL (DRAPES)
DRAPE SURG ORHT 6 SPLT 77X108 (DRAPES) IMPLANT
DRAPE U-SHAPE 47X51 STRL (DRAPES) ×3 IMPLANT
DRESSING PREVENA PLUS CUSTOM (GAUZE/BANDAGES/DRESSINGS) IMPLANT
DRSG EMULSION OIL 3X3 NADH (GAUZE/BANDAGES/DRESSINGS) ×3 IMPLANT
DRSG PAD ABDOMINAL 8X10 ST (GAUZE/BANDAGES/DRESSINGS) ×3 IMPLANT
DRSG PREVENA PLUS CUSTOM (GAUZE/BANDAGES/DRESSINGS) ×3
DURAPREP 26ML APPLICATOR (WOUND CARE) ×3 IMPLANT
ELECT REM PT RETURN 9FT ADLT (ELECTROSURGICAL) ×3
ELECTRODE REM PT RTRN 9FT ADLT (ELECTROSURGICAL) ×1 IMPLANT
GAUZE SPONGE 4X4 12PLY STRL (GAUZE/BANDAGES/DRESSINGS) ×3 IMPLANT
GLOVE BIOGEL PI IND STRL 9 (GLOVE) ×1 IMPLANT
GLOVE BIOGEL PI INDICATOR 9 (GLOVE) ×2
GLOVE SURG ORTHO 9.0 STRL STRW (GLOVE) ×3 IMPLANT
GOWN STRL REUS W/ TWL XL LVL3 (GOWN DISPOSABLE) ×3 IMPLANT
GOWN STRL REUS W/TWL XL LVL3 (GOWN DISPOSABLE) ×9
GUIDEWIRE 3.2X400 (WIRE) ×3 IMPLANT
KIT BASIN OR (CUSTOM PROCEDURE TRAY) ×3 IMPLANT
KIT ROOM TURNOVER OR (KITS) ×3 IMPLANT
KIT STIMULAN RAPID CURE  10CC (Orthopedic Implant) ×2 IMPLANT
KIT STIMULAN RAPID CURE 10CC (Orthopedic Implant) IMPLANT
MANIFOLD NEPTUNE II (INSTRUMENTS) ×3 IMPLANT
NS IRRIG 1000ML POUR BTL (IV SOLUTION) ×3 IMPLANT
PACK ORTHO EXTREMITY (CUSTOM PROCEDURE TRAY) ×3 IMPLANT
PAD ARMBOARD 7.5X6 YLW CONV (MISCELLANEOUS) ×6 IMPLANT
REAMER ROD DEEP FLUTE 2.5X950 (INSTRUMENTS) ×3 IMPLANT
SPONGE LAP 18X18 X RAY DECT (DISPOSABLE) IMPLANT
STAPLER VISISTAT 35W (STAPLE) IMPLANT
STOCKINETTE IMPERVIOUS 9X36 MD (GAUZE/BANDAGES/DRESSINGS) IMPLANT
SUT ETHILON 2 0 PSLX (SUTURE) ×4 IMPLANT
SUT VIC AB 0 CT1 27 (SUTURE)
SUT VIC AB 0 CT1 27XBRD ANBCTR (SUTURE) IMPLANT
SUT VIC AB 2-0 CT1 27 (SUTURE)
SUT VIC AB 2-0 CT1 TAPERPNT 27 (SUTURE) IMPLANT
TOWEL OR 17X24 6PK STRL BLUE (TOWEL DISPOSABLE) ×3 IMPLANT
TOWEL OR 17X26 10 PK STRL BLUE (TOWEL DISPOSABLE) ×3 IMPLANT
UNDERPAD 30X30 (UNDERPADS AND DIAPERS) ×3 IMPLANT
WATER STERILE IRR 1000ML POUR (IV SOLUTION) ×3 IMPLANT

## 2017-03-09 NOTE — Anesthesia Preprocedure Evaluation (Addendum)
Anesthesia Evaluation  Patient identified by MRN, date of birth, ID band Patient awake    Reviewed: Allergy & Precautions, NPO status , Patient's Chart, lab work & pertinent test results, reviewed documented beta blocker date and time   History of Anesthesia Complications (+) Family history of anesthesia reactionNegative for: history of anesthetic complications  Airway Mallampati: II  TM Distance: >3 FB Neck ROM: Full    Dental no notable dental hx. (+) Caps, Partial Lower   Pulmonary sleep apnea and Continuous Positive Airway Pressure Ventilation ,    Pulmonary exam normal breath sounds clear to auscultation       Cardiovascular hypertension, Pt. on medications and Pt. on home beta blockers + Peripheral Vascular Disease  Normal cardiovascular exam+ Valvular Problems/Murmurs AI  Rhythm:Regular Rate:Normal - Systolic murmurs and - Diastolic murmurs LVEF 48-18% Moderate AI Grade 2 diastolic dysfunction Nuclear stress test - no evidence of reversible ischemia Fixed defect  Apical and apical/septal Low risk   Neuro/Psych TIAnegative psych ROS   GI/Hepatic negative GI ROS, Neg liver ROS,   Endo/Other  Hyperglycemia Hyperlipidemia  Renal/GU negative Renal ROS Bladder dysfunction  Urinary frequency    Musculoskeletal  (+) Arthritis , Osteoarthritis,  Osteoporosis Chronic LBP   Abdominal   Peds  Hematology negative hematology ROS (+)   Anesthesia Other Findings   Reproductive/Obstetrics                           Anesthesia Physical  Anesthesia Plan  ASA: III  Anesthesia Plan: General   Post-op Pain Management:    Induction: Intravenous  PONV Risk Score and Plan: 3 and Ondansetron, Dexamethasone, Diphenhydramine and Treatment may vary due to age or medical condition  Airway Management Planned: LMA  Additional Equipment: None  Intra-op Plan:   Post-operative Plan: Extubation in  OR  Informed Consent: I have reviewed the patients History and Physical, chart, labs and discussed the procedure including the risks, benefits and alternatives for the proposed anesthesia with the patient or authorized representative who has indicated his/her understanding and acceptance.   Dental advisory given  Plan Discussed with: Anesthesiologist  Anesthesia Plan Comments:        Anesthesia Quick Evaluation

## 2017-03-09 NOTE — Progress Notes (Signed)
RN bladder scanned pt she was only retaining 161ml. has not voided in 6hrs. Will notify MD and keep monitoring.

## 2017-03-09 NOTE — Transfer of Care (Addendum)
Immediate Anesthesia Transfer of Care Note  Patient: Julia Mcconnell  Procedure(s) Performed: REMOVAL LEFT FEMORAL NAIL, REAM CANAL, AND PLACE ANTIBIOTIC BEADS (Left Hip)  Patient Location: PACU  Anesthesia Type:General  Level of Consciousness: oriented, drowsy and patient cooperative  Airway & Oxygen Therapy: Patient Spontanous Breathing and Patient connected to face mask oxygen  Post-op Assessment: Report given to RN, Post -op Vital signs reviewed and stable and Patient moving all extremities X 4  Post vital signs: Reviewed and stable  Last Vitals:  Vitals:   03/09/17 1120 03/09/17 1333  BP: (!) 191/67   Pulse:    Resp:    Temp:  36.5 C    Last Pain:  Vitals:   03/09/17 0904  TempSrc:   PainSc: 4       Patients Stated Pain Goal: 2 (09/40/76 8088)  Complications: No apparent anesthesia complications

## 2017-03-09 NOTE — Op Note (Signed)
03/09/2017  1:18 PM  PATIENT:  Julia Mcconnell    PRE-OPERATIVE DIAGNOSIS:  Infected Left Femoral Nail  POST-OPERATIVE DIAGNOSIS:  Same  PROCEDURE:  REMOVAL LEFT FEMORAL NAIL, REAM CANAL, AND PLACE ANTIBIOTIC BEADS  SURGEON:  [name], MD  PHYSICIAN ASSISTANT:None ANESTHESIA:   General  PREOPERATIVE INDICATIONS:  Julia Mcconnell is a  82 y.o. female with a diagnosis of Infected Left Femoral Nail who failed conservative measures and elected for surgical management.    The risks benefits and alternatives were discussed with the patient preoperatively including but not limited to the risks of infection, bleeding, nerve injury, cardiopulmonary complications, the need for revision surgery, among others, and the patient was willing to proceed.  OPERATIVE IMPLANTS: Stimulant antibiotic beads 10 cc 1 g vancomycin 240 mg gentamicin  @ENCIMAGES @  OPERATIVE FINDINGS: Necrotic fat no signs of purulence within the canal or hardware.  OPERATIVE PROCEDURE: Patient was brought to the operating room and underwent a general anesthetic.  After adequate levels of anesthesia were obtained patient was placed in the supine position with a bump under the left hip on the flat Jackson table the left lower extremity was prepped using DuraPrep draped into a sterile field a timeout was called.  Elliptical incision was made around the draining fluid from the hip.  Soft tissue was removed the fluid was sent for cultures x2.  Patient appeared to have fat necrosis as the source of the drainage.  There is no deep purulence.  The femoral nail was then secured to the extraction bolt the interlocking screw was removed and the nail was removed without complications.  There was no fibrinous tissue along the nail no signs of infection deep within the canal.  A guidewire was then inserted down the canal C-arm fluoroscopy was used to verify the alignment and the canal was sequentially reamed to 13 mm.  There was no signs of  fibrinous tissue or necrotic tissue within the canal reamings.  The reamings appeared healthy.  The wound was irrigated with normal saline the wounds were then packed with antibiotic beads 10 cc total.  The incision was closed using 2-0 nylon.  A Praveena wound VAC was applied this had a good suction fit patient was extubated taken to the PACU in stable condition.   DISCHARGE PLANNING:  Antibiotic duration: 24 hours postoperatively  Weightbearing: Nonweightbearing left lower extremity  Pain medication: As prescribed  Dressing care/ Wound VAC: Wound VAC until discharge.  Ambulatory devices: Walker.  Discharge to: Skilled nursing facility.  Follow-up: In the office 1 week post operative.
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
## 2017-03-09 NOTE — Anesthesia Procedure Notes (Signed)
Procedure Name: LMA Insertion Date/Time: 03/09/2017 12:35 PM Performed by: Freddie Breech, CRNA Pre-anesthesia Checklist: Patient identified, Emergency Drugs available, Suction available and Patient being monitored Patient Re-evaluated:Patient Re-evaluated prior to induction Oxygen Delivery Method: Circle System Utilized Preoxygenation: Pre-oxygenation with 100% oxygen Induction Type: IV induction Ventilation: Mask ventilation without difficulty LMA: LMA inserted LMA Size: 3.0 Number of attempts: 1 Airway Equipment and Method: Bite block Placement Confirmation: positive ETCO2 Tube secured with: Tape Dental Injury: Teeth and Oropharynx as per pre-operative assessment

## 2017-03-10 ENCOUNTER — Other Ambulatory Visit: Payer: Self-pay

## 2017-03-10 ENCOUNTER — Encounter (HOSPITAL_COMMUNITY): Payer: Self-pay | Admitting: Orthopedic Surgery

## 2017-03-10 LAB — GLUCOSE, CAPILLARY
Glucose-Capillary: 134 mg/dL — ABNORMAL HIGH (ref 65–99)
Glucose-Capillary: 130 mg/dL — ABNORMAL HIGH (ref 65–99)
Glucose-Capillary: 135 mg/dL — ABNORMAL HIGH (ref 65–99)

## 2017-03-10 NOTE — Social Work (Signed)
CSW spoke with [place] in admissions at [facility] and she confirmed that patient from SNF and can return once she is medically ready.  CSW will assist with disposition.  Elissa Hefty, LCSW Clinical Social Worker [phone]
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
## 2017-03-10 NOTE — Clinical Social Work Note (Addendum)
Clinical Social Work Assessment  Patient Details  Name: Julia Mcconnell MRN: [id number] Date of Birth: [birth year]  Date of referral:  03/10/17               Reason for consult:  Facility Placement                Permission sought to share information with:  Chartered certified accountant granted to share information::  Yes, Verbal Permission Granted  Name::        Agency::  [facility]  Relationship::     Contact Information:     Housing/Transportation Living arrangements for the past 2 months:  [place] of Information:  Patient, Facility Patient Interpreter Needed:  None Criminal Activity/Legal Involvement Pertinent to Current Situation/Hospitalization:  No - Comment as needed Significant Relationships:  Other Family Members Lives with:  Self Do you feel safe going back to the place where you live?  No Need for family participation in patient care:  Yes (Comment)  Care giving concerns:  CSW met with patient at bedside. Pt was admitted from [facility] from a prior hospitalization at the beginning of February for rehabilitation due to a hip fracture. While at SNF, patient was still having issues with hip and has returned for additional care. Pt agreeable to returning to [facility] once medically stable. Prior to hospitalization, patient resided alone. Pt indicated that she has support of a grandson and cousin when she returns to community after rehab.Marland Kitchen  Pt from [facility] for short term rehab and will return for continued skilled nursing needs. Pt agreeable to plan and understands the SNF options/placement.  Social Worker assessment / plan:  CSW will follow up for disposition.  Employment status:  Temporary Insurance information:  Medicare PT Recommendations:  [place] / Referral to community resources:  [place]  Patient/Family's Response to care:  Patient appreciative of CSW meeting to discuss SNF  placement. Pt agreeable with returning to SNF.  Patient/Family's Understanding of and Emotional Response to Diagnosis, Current Treatment, and Prognosis:  Pt was already at the SNF for short term rehab and will return. Pt has good understanding of her impairment and limitations. Pt will need short term rehab and will return home once complete. CSW will assist with disposition process. Pt has no issues or concerns at this time.  Emotional Assessment Appearance:  Appears stated age Attitude/Demeanor/Rapport:  (Cooperative) Affect (typically observed):  Accepting, Appropriate Orientation:  Oriented to Situation, Oriented to  Time, Oriented to Place, Oriented to Self Alcohol / Substance use:  Not Applicable Psych involvement (Current and /or in the community):  No (Comment)  Discharge Needs  Concerns to be addressed:  Discharge Planning Concerns Readmission within the last 30 days:  Yes Current discharge risk:  Dependent with Mobility, Physical Impairment Barriers to Discharge:  No Barriers Identified   Normajean Baxter, LCSW 03/10/2017, 4:18 PM

## 2017-03-10 NOTE — Plan of Care (Signed)
  Nutrition: Adequate nutrition will be maintained 03/10/2017 1001 - Progressing by Williams Che, RN   Elimination: Will not experience complications related to bowel motility 03/10/2017 1001 - Progressing by Williams Che, RN   Pain Managment: General experience of comfort will improve 03/10/2017 1001 - Progressing by Williams Che, RN   Safety: Ability to remain free from injury will improve 03/10/2017 1001 - Progressing by Williams Che, RN   Skin Integrity: Risk for impaired skin integrity will decrease 03/10/2017 1001 - Progressing by Williams Che, RN

## 2017-03-10 NOTE — Evaluation (Signed)
Physical Therapy Evaluation Patient Details Name: Julia Mcconnell MRN: [id number] DOB: 05-10-1935 Today's Date: 03/10/2017   History of Present Illness  Julia Mcconnell is a 82 y.o. female with medical history significant of HTN, HLD, history of CVA, OSA on CPAP, chronic back pain; fall with fracture with IM NAIL, re-admitted with infection now s/p     Clinical Impression  Patient is s/p above surgery resulting in functional limitations due to the deficits listed below (see PT Problem List). PTA, patient living at [facility] rehabbing from initial  fracture/surgery. Upon eval, pt presents with high levels of post op pain and weakness that limit her mobility. Currently Max A for OOB transfers with safe adherence to NWB status. Will benefit from continued sub-acute level rehab. Patient will benefit from skilled PT to increase their independence and safety with mobility to allow discharge to the venue listed below.       Follow Up Recommendations SNF;Supervision/Assistance - 24 hour    Equipment Recommendations  None recommended by PT    Recommendations for Other Services       Precautions / Restrictions Precautions Precautions: Fall Restrictions Weight Bearing Restrictions: Yes LLE Weight Bearing: Non weight bearing      Mobility  Bed Mobility Overal bed mobility: Needs Assistance Bed Mobility: Supine to Sit     Supine to sit: Min assist     General bed mobility comments: Min A to assist with surgical limb over EOB, pt with good overall moblility ability to power trunk and reach for rails to scoot herself. Min A   Transfers Overall transfer level: Needs assistance Equipment used: Rolling walker (2 wheeled);1 person hand held assist Transfers: Sit to/from [facility] Sit to Stand: Max assist Stand pivot transfers: Max assist       General transfer comment: Max A to power up, cues for NWB status. Pt unable to balance on 1 leg   Ambulation/Gait              General Gait Details: unable  Stairs            Wheelchair Mobility    Modified Rankin (Stroke Patients Only)       Balance Overall balance assessment: Needs assistance   Sitting balance-Leahy Scale: Fair       Standing balance-Leahy Scale: Poor                               Pertinent Vitals/Pain Pain Assessment: Faces Faces Pain Scale: Hurts whole lot Pain Location: L HIP Pain Descriptors / Indicators: Grimacing;Operative site guarding;Aching Pain Intervention(s): Limited activity within patient's tolerance;Monitored during session;Premedicated before session;Repositioned    Home Living Family/patient expects to be discharged to:: Skilled nursing facility                 Additional Comments: [facility]     Prior Function Level of Independence: Needs assistance         Comments: pt rehabbing at SNF prior to re-admission      Hand Dominance        Extremity/Trunk Assessment   Upper Extremity Assessment Upper Extremity Assessment: Overall WFL for tasks assessed    Lower Extremity Assessment Lower Extremity Assessment: LLE deficits/detail(RLE strength 4-/5) LLE Deficits / Details: pain and weakness consistent with above procedure.     Cervical / Trunk Assessment Cervical / Trunk Assessment: Kyphotic  Communication   Communication: No difficulties  Cognition Arousal/Alertness: Awake/alert Behavior During  Therapy: WFL for tasks assessed/performed Overall Cognitive Status: Within Functional Limits for tasks assessed                                        General Comments      Exercises     Assessment/Plan    PT Assessment Patient needs continued PT services  PT Problem List Decreased strength;Decreased range of motion;Decreased balance;Decreased activity tolerance;Decreased mobility;Decreased knowledge of use of DME;Pain       PT Treatment Interventions DME instruction;Gait training;Stair  training;Functional mobility training;Therapeutic activities;Therapeutic exercise    PT Goals (Current goals can be found in the Care Plan section)  Acute Rehab PT Goals Patient Stated Goal: go back to rehab PT Goal Formulation: With patient Time For Goal Achievement: 11/16/17 Potential to Achieve Goals: Good    Frequency Min 3X/week   Barriers to discharge Decreased caregiver support lives alone    Co-evaluation               AM-PAC PT "6 Clicks" Daily Activity  Outcome Measure Difficulty turning over in bed (including adjusting bedclothes, sheets and blankets)?: Unable Difficulty moving from lying on back to sitting on the side of the bed? : Unable Difficulty sitting down on and standing up from a chair with arms (e.g., wheelchair, bedside commode, etc,.)?: Unable Help needed moving to and from a bed to chair (including a wheelchair)?: A Lot Help needed walking in hospital room?: Total Help needed climbing 3-5 steps with a railing? : Total 6 Click Score: 7    End of Session Equipment Utilized During Treatment: Gait belt Activity Tolerance: Patient tolerated treatment well Patient left: in chair;with call bell/phone within reach Nurse Communication: Mobility status PT Visit Diagnosis: Unsteadiness on feet (R26.81);Other abnormalities of gait and mobility (R26.89);Muscle weakness (generalized) (M62.81);Pain;History of falling (Z91.81) Pain - Right/Left: Left Pain - part of body: Hip    Time: 1105-1130 PT Time Calculation (min) (ACUTE ONLY): 25 min   Charges:   PT Evaluation $PT Eval Low Complexity: 1 Low PT Treatments $Therapeutic Activity: 8-22 mins   PT G Codes:        Reinaldo Berber, PT, DPT Acute Rehab Services Pager: [phone]    Reinaldo Berber 03/10/2017, 11:53 AM

## 2017-03-10 NOTE — Anesthesia Postprocedure Evaluation (Signed)
Anesthesia Post Note  Patient: Julia Mcconnell  Procedure(s) Performed: REMOVAL LEFT FEMORAL NAIL, REAM CANAL, AND PLACE ANTIBIOTIC BEADS (Left Hip)     Patient location during evaluation: PACU Anesthesia Type: General Level of consciousness: awake and alert Pain management: pain level controlled Vital Signs Assessment: post-procedure vital signs reviewed and stable Respiratory status: spontaneous breathing, nonlabored ventilation and respiratory function stable Cardiovascular status: stable Postop Assessment: no apparent nausea or vomiting Anesthetic complications: no Comments: Issues with hypotension in PACU, treated with fluids, albumin, and vasopressors. Stabilized.    Last Vitals:  Vitals:   03/09/17 2103 03/10/17 0442  BP: 94/79 (!) 148/66  Pulse: 99 93  Resp: 18 19  Temp: 36.6 C 36.7 C  SpO2: 100% 100%    Last Pain:  Vitals:   03/10/17 0542  TempSrc:   PainSc: [place]

## 2017-03-10 NOTE — Progress Notes (Signed)
Patient ID: Julia Mcconnell, female   DOB: 1935-03-27, 82 y.o.   MRN: [id number] Patient complains of pain left lower extremity.  There is about 300 cc of clear serosanguineous drainage in the wound VAC.  Will need to continue inpatient hospitalization until the drainage subsides.  Will reevaluate tomorrow.

## 2017-03-11 DIAGNOSIS — M1990 Unspecified osteoarthritis, unspecified site: Secondary | ICD-10-CM | POA: Diagnosis not present

## 2017-03-11 DIAGNOSIS — G4733 Obstructive sleep apnea (adult) (pediatric): Secondary | ICD-10-CM | POA: Diagnosis present

## 2017-03-11 DIAGNOSIS — Z96652 Presence of left artificial knee joint: Secondary | ICD-10-CM | POA: Diagnosis present

## 2017-03-11 DIAGNOSIS — R2689 Other abnormalities of gait and mobility: Secondary | ICD-10-CM | POA: Diagnosis not present

## 2017-03-11 DIAGNOSIS — M810 Age-related osteoporosis without current pathological fracture: Secondary | ICD-10-CM | POA: Diagnosis present

## 2017-03-11 DIAGNOSIS — M6281 Muscle weakness (generalized): Secondary | ICD-10-CM | POA: Diagnosis not present

## 2017-03-11 DIAGNOSIS — I739 Peripheral vascular disease, unspecified: Secondary | ICD-10-CM | POA: Diagnosis present

## 2017-03-11 DIAGNOSIS — Y831 Surgical operation with implant of artificial internal device as the cause of abnormal reaction of the patient, or of later complication, without mention of misadventure at the time of the procedure: Secondary | ICD-10-CM | POA: Diagnosis present

## 2017-03-11 DIAGNOSIS — Z823 Family history of stroke: Secondary | ICD-10-CM | POA: Diagnosis not present

## 2017-03-11 DIAGNOSIS — R739 Hyperglycemia, unspecified: Secondary | ICD-10-CM | POA: Diagnosis present

## 2017-03-11 DIAGNOSIS — K219 Gastro-esophageal reflux disease without esophagitis: Secondary | ICD-10-CM | POA: Diagnosis present

## 2017-03-11 DIAGNOSIS — I10 Essential (primary) hypertension: Secondary | ICD-10-CM | POA: Diagnosis present

## 2017-03-11 DIAGNOSIS — Z96642 Presence of left artificial hip joint: Secondary | ICD-10-CM | POA: Diagnosis present

## 2017-03-11 DIAGNOSIS — Z8673 Personal history of transient ischemic attack (TIA), and cerebral infarction without residual deficits: Secondary | ICD-10-CM | POA: Diagnosis not present

## 2017-03-11 DIAGNOSIS — S72012D Unspecified intracapsular fracture of left femur, subsequent encounter for closed fracture with routine healing: Secondary | ICD-10-CM | POA: Diagnosis not present

## 2017-03-11 DIAGNOSIS — M545 Low back pain: Secondary | ICD-10-CM | POA: Diagnosis present

## 2017-03-11 DIAGNOSIS — R488 Other symbolic dysfunctions: Secondary | ICD-10-CM | POA: Diagnosis not present

## 2017-03-11 DIAGNOSIS — L02416 Cutaneous abscess of left lower limb: Secondary | ICD-10-CM | POA: Diagnosis present

## 2017-03-11 DIAGNOSIS — Z885 Allergy status to narcotic agent status: Secondary | ICD-10-CM | POA: Diagnosis not present

## 2017-03-11 DIAGNOSIS — L03116 Cellulitis of left lower limb: Secondary | ICD-10-CM | POA: Diagnosis present

## 2017-03-11 DIAGNOSIS — Z4789 Encounter for other orthopedic aftercare: Secondary | ICD-10-CM | POA: Diagnosis not present

## 2017-03-11 DIAGNOSIS — T84621A Infection and inflammatory reaction due to internal fixation device of left femur, initial encounter: Secondary | ICD-10-CM | POA: Diagnosis present

## 2017-03-11 DIAGNOSIS — G8929 Other chronic pain: Secondary | ICD-10-CM | POA: Diagnosis present

## 2017-03-11 DIAGNOSIS — G8911 Acute pain due to trauma: Secondary | ICD-10-CM | POA: Diagnosis not present

## 2017-03-11 DIAGNOSIS — E785 Hyperlipidemia, unspecified: Secondary | ICD-10-CM | POA: Diagnosis present

## 2017-03-11 DIAGNOSIS — S72012S Unspecified intracapsular fracture of left femur, sequela: Secondary | ICD-10-CM | POA: Diagnosis not present

## 2017-03-11 DIAGNOSIS — R262 Difficulty in walking, not elsewhere classified: Secondary | ICD-10-CM | POA: Diagnosis not present

## 2017-03-11 LAB — GLUCOSE, CAPILLARY
GLUCOSE-CAPILLARY: 131 mg/dL — AB (ref 65–99)
Glucose-Capillary: 132 mg/dL — ABNORMAL HIGH (ref 65–99)

## 2017-03-11 MED ORDER — PIPERACILLIN-TAZOBACTAM 3.375 G IVPB
3.3750 g | Freq: Three times a day (TID) | INTRAVENOUS | Status: DC
  Administered 2017-03-11 – 2017-03-14 (×10): 3.375 g via INTRAVENOUS
  Filled 2017-03-11 (×11): qty 50

## 2017-03-11 NOTE — Progress Notes (Signed)
Patient ID: Julia Mcconnell, female   DOB: [birth year], 82 y.o.   MRN: [id number] Patient is status post irrigation and debridement left hip placement of antibiotic beads and removal of intramedullary nail.  She states she still has left thigh pain.  There is no new drainage in the wound VAC canister still with 300 cc.  Tissue cultures are showing gram-negative rods we will add Zosyn antibiotic.  Possible discharge to skilled nursing on Saturday.

## 2017-03-12 MED ORDER — SULFAMETHOXAZOLE-TRIMETHOPRIM 800-160 MG PO TABS: 1.0000 | ORAL_TABLET | Freq: Two times a day (BID) | ORAL | 0 refills | Status: DC

## 2017-03-12 MED ORDER — HYDROCODONE-ACETAMINOPHEN 5-325 MG PO TABS: 1.0000 | ORAL_TABLET | ORAL | 0 refills | Status: DC | PRN

## 2017-03-12 NOTE — Progress Notes (Signed)
Physical Therapy Treatment Patient Details Name: Julia Mcconnell MRN: [id number] DOB: [birth year] Today's Date: 03/12/2017    History of Present Illness Julia Mcconnell is a 82 y.o. female with medical history significant of HTN, HLD, history of CVA, OSA on CPAP, chronic back pain; fall with fracture with IM NAIL, re-admitted with infection now s/p     PT Comments    PT requested to assist patient into bedside chair today. Patient overall progressing slowly with therapy. Patient limits her movement due to high levels of pain. Encouraged and reminded of the importance of mobility and avoiding long periods of bed rest. Pt attempting to tolerate single leg standing however is too weak to do so, max A for pivot into chair this afternoon. Will continue to progress as tolerated, SNF recs still appropriate.   Follow Up Recommendations  SNF;Supervision/Assistance - 24 hour     Equipment Recommendations  None recommended by PT    Recommendations for Other Services       Precautions / Restrictions Precautions Precautions: Fall Restrictions Weight Bearing Restrictions: Yes LLE Weight Bearing: Non weight bearing    Mobility  Bed Mobility Overal bed mobility: Needs Assistance Bed Mobility: Supine to Sit     Supine to sit: Min assist     General bed mobility comments: pain limiting progress, still Min A to assist with surgical limb over EOB, pt with good overall moblility ability to power trunk and reach for rails to scoot herself. Min A   Transfers Overall transfer level: Needs assistance Equipment used: 1 person hand held assist Transfers: Sit to/from [facility] Sit to Stand: Max assist Stand pivot transfers: Max assist       General transfer comment: pt with increased attempt to tolerate SLS but still unable at this time. max A to power up and pivot into chair.   Ambulation/Gait                 Stairs            Wheelchair Mobility    Modified  Rankin (Stroke Patients Only)       Balance                                            Cognition Arousal/Alertness: Awake/alert Behavior During Therapy: WFL for tasks assessed/performed Overall Cognitive Status: Within Functional Limits for tasks assessed                                        Exercises      General Comments        Pertinent Vitals/Pain Pain Assessment: Faces Faces Pain Scale: Hurts whole lot Pain Location: L HIP Pain Descriptors / Indicators: Grimacing;Operative site guarding;Aching Pain Intervention(s): Limited activity within patient's tolerance;Monitored during session;Premedicated before session;Repositioned    Home Living                      Prior Function            PT Goals (current goals can now be found in the care plan section) Acute Rehab PT Goals PT Goal Formulation: With patient Time For Goal Achievement: 11/16/17 Potential to Achieve Goals: Good Progress towards PT goals: Progressing toward goals    Frequency  Min 3X/week      PT Plan Current plan remains appropriate    Co-evaluation              AM-PAC PT "6 Clicks" Daily Activity  Outcome Measure  Difficulty turning over in bed (including adjusting bedclothes, sheets and blankets)?: Unable Difficulty moving from lying on back to sitting on the side of the bed? : Unable Difficulty sitting down on and standing up from a chair with arms (e.g., wheelchair, bedside commode, etc,.)?: Unable Help needed moving to and from a bed to chair (including a wheelchair)?: A Lot Help needed walking in hospital room?: Total Help needed climbing 3-5 steps with a railing? : Total 6 Click Score: 7    End of Session Equipment Utilized During Treatment: Gait belt Activity Tolerance: Patient tolerated treatment well Patient left: in chair;with call bell/phone within reach Nurse Communication: Mobility status PT Visit Diagnosis:  Unsteadiness on feet (R26.81);Other abnormalities of gait and mobility (R26.89);Muscle weakness (generalized) (M62.81);Pain;History of falling (Z91.81) Pain - Right/Left: Left Pain - part of body: Hip     Time: 8850-2774 PT Time Calculation (min) (ACUTE ONLY): 20 min  Charges:  $Therapeutic Activity: 8-22 mins                    G Codes:       Reinaldo Berber, PT, DPT Acute Rehab Services Pager: [phone]     Reinaldo Berber 03/12/2017, 9:35 PM

## 2017-03-12 NOTE — Discharge Summary (Signed)
Discharge Diagnoses:  Active Problems:   Infection of lower extremity associated with hardware ([place])   Infected hardware in left leg, sequela   Surgeries: Procedure(s): REMOVAL LEFT FEMORAL NAIL, REAM CANAL, AND PLACE ANTIBIOTIC BEADS on 03/09/2017    Consultants:   Discharged Condition: Improved  Hospital Course: Julia Mcconnell is an 82 y.o. female who was admitted 03/09/2017 with a chief complaint of infected hardware left hip, with a final diagnosis of Infected Left Femoral Nail.  Patient was brought to the operating room on 03/09/2017 and underwent Procedure(s): REMOVAL LEFT FEMORAL NAIL, REAM CANAL, AND PLACE ANTIBIOTIC BEADS.    Patient was given perioperative antibiotics:  Anti-infectives (From admission, onward)   Start     Dose/Rate Route Frequency Ordered Stop   03/12/17 0000  sulfamethoxazole-trimethoprim (BACTRIM DS,SEPTRA DS) 800-160 MG tablet     1 tablet Oral 2 times daily 03/12/17 0859     03/11/17 0800  piperacillin-tazobactam (ZOSYN) IVPB 3.375 g     3.375 g 12.5 mL/hr over 240 Minutes Intravenous Every 8 hours 03/11/17 0719     03/09/17 1800  ceFAZolin (ANCEF) IVPB 1 g/50 mL premix  Status:  Discontinued     1 g 100 mL/hr over 30 Minutes Intravenous Every 6 hours 03/09/17 1653 03/11/17 0719   03/09/17 1240  gentamicin (GARAMYCIN) injection  Status:  Discontinued       As needed 03/09/17 1256 03/09/17 1323   03/09/17 1240  vancomycin (VANCOCIN) powder  Status:  Discontinued       As needed 03/09/17 1257 03/09/17 1323   03/09/17 0817  ceFAZolin (ANCEF) IVPB 2g/100 mL premix     2 g 200 mL/hr over 30 Minutes Intravenous On call to O.R. 03/09/17 6073 03/09/17 1242    .  Patient was given sequential compression devices, early ambulation, and aspirin for DVT prophylaxis.  Recent vital signs:  Patient Vitals for the past 24 hrs:  BP Temp Temp src Pulse Resp SpO2  03/12/17 0848 (!) 165/47 - - 79 - -  03/12/17 0528 (!) 165/47 (!) 97.5 F (36.4 C) Axillary 79 16 99  %  03/11/17 1930 (!) 151/44 98.4 F (36.9 C) Oral 80 18 96 %  03/11/17 1603 (!) 123/49 - - 74 - -  03/11/17 1254 (!) 123/49 98.1 F (36.7 C) Oral 74 16 95 %  03/11/17 1100 - - - - - 96 %  .  Recent laboratory studies: No results found.  Discharge Medications:   Allergies as of 03/12/2017      Reactions   Codeine Other (See Comments)   Altered mental status      Medication List    TAKE these medications   acetaminophen 500 MG tablet Commonly known as:  TYLENOL Take 1 tablet (500 mg total) by mouth every 6 (six) hours as needed for mild pain.   aspirin EC 81 MG tablet Take 1 tablet (81 mg total) by mouth daily.   atorvastatin 20 MG tablet Commonly known as:  LIPITOR Take 20 mg by mouth daily.   calcium carbonate 600 MG Tabs tablet Commonly known as:  OS-CAL Take 600 mg by mouth 2 (two) times daily.   carvedilol 3.125 MG tablet Commonly known as:  COREG Take 1 tablet (3.125 mg total) by mouth 2 (two) times daily with a meal.   clopidogrel 75 MG tablet Commonly known as:  PLAVIX Take 1 tablet (75 mg total) by mouth daily.   HYDROcodone-acetaminophen 5-325 MG tablet Commonly known as:  NORCO/VICODIN Take 1  tablet by mouth every 4 (four) hours as needed for moderate pain.   losartan 50 MG tablet Commonly known as:  COZAAR Take 1 tablet (50 mg total) by mouth daily. Hold until follow-up with your doctor or BP normal   multivitamin tablet Take 1 tablet by mouth daily.   phenol 1.4 % Liqd Commonly known as:  CHLORASEPTIC Use as directed 1 spray in the mouth or throat as needed for throat irritation / pain.   polyethylene glycol packet Commonly known as:  MIRALAX / GLYCOLAX Take 17 g by mouth daily as needed for moderate constipation.   senna-docusate 8.6-50 MG tablet Commonly known as:  Senokot-S Take 1 tablet by mouth 2 (two) times daily.   sulfamethoxazole-trimethoprim 800-160 MG tablet Commonly known as:  BACTRIM DS,SEPTRA DS Take 1 tablet by mouth 2 (two)  times daily.   traMADol 50 MG tablet Commonly known as:  ULTRAM Take 50 mg by mouth every 6 (six) hours as needed.       Diagnostic Studies: Dg C-arm 1-60 Min-no Report  Result Date: 03/09/2017 Fluoroscopy was utilized by the requesting physician.  No radiographic interpretation.   Xr Femur Min 2 Views Left  Result Date: 03/07/2017 2 view radiographs of the left femur shows good callus formation around the fracture site there has been some shortening through the fracture site.   Patient benefited maximally from their hospital stay and there were no complications.     Disposition: 03-Skilled Nursing Facility Discharge Instructions    Call MD / Call 911   Complete by:  As directed    If you experience chest pain or shortness of breath, CALL 911 and be transported to the hospital emergency room.  If you develope a fever above 101 F, pus (white drainage) or increased drainage or redness at the wound, or calf pain, call your surgeon's office.   Constipation Prevention   Complete by:  As directed    Drink plenty of fluids.  Prune juice may be helpful.  You may use a stool softener, such as Colace (over the counter) 100 mg twice a day.  Use MiraLax (over the counter) for constipation as needed.   Diet - low sodium heart healthy   Complete by:  As directed    Increase activity slowly as tolerated   Complete by:  As directed    Negative Pressure Wound Therapy - Incisional   Complete by:  As directed    Remove the hospital pump and attached to the portable Praveena pump.  This can be obtained from the operating room.  This will stay in place for 1 week.  The portable pump needs to be plugged in to ensure that it is charged.     Follow-up Information    [name], MD Follow up in 2 week(s).   Specialty:  Orthopedic Surgery Contact information: [place] [address] [phone]            Signed: [name] 03/12/2017, 8:59 AM

## 2017-03-12 NOTE — Plan of Care (Signed)
  Nutrition: Adequate nutrition will be maintained 03/12/2017 1000 - Progressing by Williams Che, RN   Elimination: Will not experience complications related to bowel motility 03/12/2017 1000 - Progressing by Williams Che, RN   Pain Managment: General experience of comfort will improve 03/12/2017 1000 - Progressing by Williams Che, RN   Safety: Ability to remain free from injury will improve 03/12/2017 1000 - Progressing by Williams Che, RN

## 2017-03-12 NOTE — Progress Notes (Signed)
Patient ID: Julia Mcconnell, female   DOB: 12-Feb-1935, 82 y.o.   MRN: [id number] Culture sensitive to Bactrim DS.  Will discharge back to skilled nursing on Bactrim DS nonweightbearing left lower extremity.  Will have the wound VAC transition to the Praveena pump.

## 2017-03-12 NOTE — Progress Notes (Signed)
Changed to prevena wound vac.

## 2017-03-13 NOTE — Plan of Care (Signed)
  Activity: Risk for activity intolerance will decrease 03/13/2017 1121 - Progressing by Williams Che, RN   Nutrition: Adequate nutrition will be maintained 03/13/2017 1121 - Progressing by Williams Che, RN   Pain Managment: General experience of comfort will improve 03/13/2017 1121 - Progressing by Williams Che, RN   Safety: Ability to remain free from injury will improve 03/13/2017 1121 - Progressing by Williams Che, RN   Skin Integrity: Risk for impaired skin integrity will decrease 03/13/2017 1121 - Progressing by Williams Che, RN

## 2017-03-13 NOTE — Progress Notes (Signed)
   Subjective: 4 Days Post-Op Procedure(s) (LRB): REMOVAL LEFT FEMORAL NAIL, REAM CANAL, AND PLACE ANTIBIOTIC BEADS (Left) Patient reports pain as hurts only when I move. .    Objective: Vital signs in last 24 hours: Temp:  [98.5 F (36.9 C)-98.7 F (37.1 C)] 98.7 F (37.1 C) (03/03 0725) Pulse Rate:  [71-78] 78 (03/03 0725) Resp:  [16] 16 (03/03 0725) BP: (157-160)/(40-54) 160/54 (03/03 0725) SpO2:  [99 %-100 %] 99 % (03/03 0725)  Intake/Output from previous day: 03/02 0701 - 03/03 0700 In: 1792.3 [P.O.:720; I.V.:822.3; IV Piggyback:250] Out: 600 [Urine:600] Intake/Output this shift: Total I/O In: -  Out: 600 [Urine:600]  No results for input(s): HGB in the last 72 hours. No results for input(s): WBC, RBC, HCT, PLT in the last 72 hours. No results for input(s): NA, K, CL, CO2, BUN, CREATININE, GLUCOSE, CALCIUM in the last 72 hours. No results for input(s): LABPT, INR in the last 72 hours.  dressing dry. no leakage.  No results found.  Assessment/Plan: 4 Days Post-Op Procedure(s) (LRB): REMOVAL LEFT FEMORAL NAIL, REAM CANAL, AND PLACE ANTIBIOTIC BEADS (Left) Discharge to SNF due to slow mobility and weakness.   Julia Mcconnell 03/13/2017, 8:51 AM

## 2017-03-13 NOTE — Clinical Social Work Note (Signed)
Pt to d/c to [facility] 3/4.   [place], [place]

## 2017-03-14 ENCOUNTER — Ambulatory Visit (INDEPENDENT_AMBULATORY_CARE_PROVIDER_SITE_OTHER): Payer: Medicare Other | Admitting: Orthopedic Surgery

## 2017-03-14 DIAGNOSIS — R2689 Other abnormalities of gait and mobility: Secondary | ICD-10-CM | POA: Diagnosis not present

## 2017-03-14 DIAGNOSIS — G8911 Acute pain due to trauma: Secondary | ICD-10-CM | POA: Diagnosis not present

## 2017-03-14 DIAGNOSIS — R262 Difficulty in walking, not elsewhere classified: Secondary | ICD-10-CM | POA: Diagnosis not present

## 2017-03-14 DIAGNOSIS — H109 Unspecified conjunctivitis: Secondary | ICD-10-CM | POA: Diagnosis not present

## 2017-03-14 DIAGNOSIS — K5900 Constipation, unspecified: Secondary | ICD-10-CM | POA: Diagnosis not present

## 2017-03-14 DIAGNOSIS — R2681 Unsteadiness on feet: Secondary | ICD-10-CM | POA: Diagnosis not present

## 2017-03-14 DIAGNOSIS — D649 Anemia, unspecified: Secondary | ICD-10-CM | POA: Diagnosis not present

## 2017-03-14 DIAGNOSIS — M25552 Pain in left hip: Secondary | ICD-10-CM | POA: Diagnosis not present

## 2017-03-14 DIAGNOSIS — Z9889 Other specified postprocedural states: Secondary | ICD-10-CM | POA: Diagnosis not present

## 2017-03-14 DIAGNOSIS — Z8673 Personal history of transient ischemic attack (TIA), and cerebral infarction without residual deficits: Secondary | ICD-10-CM | POA: Diagnosis not present

## 2017-03-14 DIAGNOSIS — M1711 Unilateral primary osteoarthritis, right knee: Secondary | ICD-10-CM | POA: Diagnosis not present

## 2017-03-14 DIAGNOSIS — S7292XA Unspecified fracture of left femur, initial encounter for closed fracture: Secondary | ICD-10-CM | POA: Diagnosis not present

## 2017-03-14 DIAGNOSIS — M6281 Muscle weakness (generalized): Secondary | ICD-10-CM | POA: Diagnosis not present

## 2017-03-14 DIAGNOSIS — S72002D Fracture of unspecified part of neck of left femur, subsequent encounter for closed fracture with routine healing: Secondary | ICD-10-CM | POA: Diagnosis not present

## 2017-03-14 DIAGNOSIS — Z4789 Encounter for other orthopedic aftercare: Secondary | ICD-10-CM | POA: Diagnosis not present

## 2017-03-14 DIAGNOSIS — H00033 Abscess of eyelid right eye, unspecified eyelid: Secondary | ICD-10-CM | POA: Diagnosis not present

## 2017-03-14 DIAGNOSIS — M1990 Unspecified osteoarthritis, unspecified site: Secondary | ICD-10-CM | POA: Diagnosis not present

## 2017-03-14 DIAGNOSIS — R488 Other symbolic dysfunctions: Secondary | ICD-10-CM | POA: Diagnosis not present

## 2017-03-14 DIAGNOSIS — S72012D Unspecified intracapsular fracture of left femur, subsequent encounter for closed fracture with routine healing: Secondary | ICD-10-CM | POA: Diagnosis not present

## 2017-03-14 DIAGNOSIS — R112 Nausea with vomiting, unspecified: Secondary | ICD-10-CM | POA: Diagnosis not present

## 2017-03-14 DIAGNOSIS — S72002A Fracture of unspecified part of neck of left femur, initial encounter for closed fracture: Secondary | ICD-10-CM | POA: Diagnosis not present

## 2017-03-14 DIAGNOSIS — S72012S Unspecified intracapsular fracture of left femur, sequela: Secondary | ICD-10-CM | POA: Diagnosis not present

## 2017-03-14 DIAGNOSIS — M25561 Pain in right knee: Secondary | ICD-10-CM | POA: Diagnosis not present

## 2017-03-14 DIAGNOSIS — T8149XA Infection following a procedure, other surgical site, initial encounter: Secondary | ICD-10-CM | POA: Diagnosis not present

## 2017-03-14 DIAGNOSIS — L309 Dermatitis, unspecified: Secondary | ICD-10-CM | POA: Diagnosis not present

## 2017-03-14 DIAGNOSIS — I10 Essential (primary) hypertension: Secondary | ICD-10-CM | POA: Diagnosis not present

## 2017-03-14 LAB — AEROBIC/ANAEROBIC CULTURE W GRAM STAIN (SURGICAL/DEEP WOUND)

## 2017-03-14 LAB — AEROBIC/ANAEROBIC CULTURE (SURGICAL/DEEP WOUND)

## 2017-03-14 NOTE — Social Work (Signed)
Clinical Social Worker facilitated patient discharge including contacting patient family and facility to confirm patient discharge plans.  Clinical information faxed to facility and family agreeable with plan.    CSW arranged ambulance transport via [facility] to [facility].    RN to call [phone] to give report prior to discharge.  Clinical Social Worker will sign off for now as social work intervention is no longer needed. Please consult us again if new need arises.  Chemika Nightengale, LCSW Clinical Social Worker [phone]      

## 2017-03-14 NOTE — Plan of Care (Signed)
  Nutrition: Adequate nutrition will be maintained 03/14/2017 0959 - Progressing by Williams Che, RN   Elimination: Will not experience complications related to bowel motility 03/14/2017 0959 - Progressing by Williams Che, RN   Pain Managment: General experience of comfort will improve 03/14/2017 0959 - Progressing by Williams Che, RN   Safety: Ability to remain free from injury will improve 03/14/2017 0959 - Progressing by Williams Che, RN   Skin Integrity: Risk for impaired skin integrity will decrease 03/14/2017 0959 - Progressing by Williams Che, RN

## 2017-03-14 NOTE — Progress Notes (Signed)
Patient ID: Julia Mcconnell, female   DOB: 1935/08/09, 82 y.o.   MRN: [id number] Hospital day 6.  Plan for discharge back to skilled nursing facility today.  No change in the medications or orders no change in her discharge summary.  Discharge was held on Saturday by social work stating the patient's status was not inpatient for 3 days.

## 2017-03-14 NOTE — Progress Notes (Signed)
Physical Therapy Treatment Patient Details Name: Julia Mcconnell MRN: [id number] DOB: November 18, 1935 Today's Date: 03/14/2017    History of Present Illness Julia Mcconnell is a 82 y.o. female with medical history significant of HTN, HLD, history of CVA, OSA on CPAP, chronic back pain; fall with fracture with IM NAIL, re-admitted with infection now s/p     PT Comments    Patient agreeable to participate in therapy. Pt required max A for OOB transfer and continues to have difficulty maintaining NWB status. Continue to progress as tolerated with anticipated d/c to SNF for further skilled PT services.     Follow Up Recommendations  SNF;Supervision/Assistance - 24 hour     Equipment Recommendations  None recommended by PT    Recommendations for Other Services       Precautions / Restrictions Precautions Precautions: Fall Restrictions Weight Bearing Restrictions: Yes LLE Weight Bearing: Non weight bearing    Mobility  Bed Mobility Overal bed mobility: Needs Assistance Bed Mobility: Supine to Sit     Supine to sit: Mod assist;HOB elevated     General bed mobility comments: assist to bring L LE to EOB and to scoot hips with use of bed pad; cues for sequencing and use of rail  Transfers Overall transfer level: Needs assistance Equipment used: None Transfers: Sit to/from [employer] Transfers Sit to Stand: Max assist Stand pivot transfers: Max assist       General transfer comment: assist to maintain NWB status, power up into standing, and to pivot R foot with R knee blocked; cues for safe hand placement and sequencing  Ambulation/Gait             General Gait Details: unable   Stairs            Wheelchair Mobility    Modified Rankin (Stroke Patients Only)       Balance Overall balance assessment: Needs assistance Sitting-balance support: Bilateral upper extremity supported;Feet supported Sitting balance-Leahy Scale: Fair     Standing balance  support: Bilateral upper extremity supported Standing balance-Leahy Scale: Poor                              Cognition Arousal/Alertness: Awake/alert Behavior During Therapy: WFL for tasks assessed/performed Overall Cognitive Status: Within Functional Limits for tasks assessed                                        Exercises General Exercises - Lower Extremity Heel Slides: AAROM;Left;5 reps    General Comments        Pertinent Vitals/Pain Pain Assessment: Faces Faces Pain Scale: Hurts even more Pain Location: L HIP Pain Descriptors / Indicators: Grimacing;Aching;Guarding Pain Intervention(s): Monitored during session;Limited activity within patient's tolerance;Premedicated before session;Repositioned    Home Living                      Prior Function            PT Goals (current goals can now be found in the care plan section) Acute Rehab PT Goals PT Goal Formulation: With patient Time For Goal Achievement: 11/16/17 Potential to Achieve Goals: Good Progress towards PT goals: Progressing toward goals    Frequency    Min 3X/week      PT Plan Current plan remains appropriate    Co-evaluation  AM-PAC PT "6 Clicks" Daily Activity  Outcome Measure  Difficulty turning over in bed (including adjusting bedclothes, sheets and blankets)?: Unable Difficulty moving from lying on back to sitting on the side of the bed? : Unable Difficulty sitting down on and standing up from a chair with arms (e.g., wheelchair, bedside commode, etc,.)?: Unable Help needed moving to and from a bed to chair (including a wheelchair)?: A Lot Help needed walking in hospital room?: Total Help needed climbing 3-5 steps with a railing? : Total 6 Click Score: 7    End of Session Equipment Utilized During Treatment: Gait belt Activity Tolerance: Patient limited by pain Patient left: in chair;with call bell/phone within reach;with chair  alarm set Nurse Communication: Mobility status PT Visit Diagnosis: Unsteadiness on feet (R26.81);Other abnormalities of gait and mobility (R26.89);Muscle weakness (generalized) (M62.81);Pain;History of falling (Z91.81) Pain - Right/Left: Left Pain - part of body: Hip     Time: 2751-7001 PT Time Calculation (min) (ACUTE ONLY): 31 min  Charges:  $Therapeutic Activity: 23-37 mins                    G Codes:       Earney Navy, PTA Pager: [phone]     Darliss Cheney 03/14/2017, 9:30 AM

## 2017-03-14 NOTE — Progress Notes (Signed)
Removed IV, all questions and concerns addressed, Pt not in distress. Attempted to give report again to [facility] but still no answer. Pt discharged to facility with belongings via PTAR, gave my contact number to transport for facility to contact for questions and concerns.

## 2017-03-14 NOTE — Discharge Summary (Signed)
Discharge Diagnoses:  Active Problems:   Infection of lower extremity associated with hardware ([place])   Infected hardware in left leg, sequela   Surgeries: Procedure(s): REMOVAL LEFT FEMORAL NAIL, REAM CANAL, AND PLACE ANTIBIOTIC BEADS on 03/09/2017    Consultants:   Discharged Condition: Improved  Hospital Course: Julia Mcconnell is an 82 y.o. female who was admitted 03/09/2017 with a chief complaint of infection left hip status post intramedullary nail fixation for subtrochanteric femur fracture, with a final diagnosis of Infected Left Femoral Nail.  Patient was brought to the operating room on 03/09/2017 and underwent Procedure(s): REMOVAL LEFT FEMORAL NAIL, REAM CANAL, AND PLACE ANTIBIOTIC BEADS.    Patient was given perioperative antibiotics:  Anti-infectives (From admission, onward)   Start     Dose/Rate Route Frequency Ordered Stop   03/12/17 0000  sulfamethoxazole-trimethoprim (BACTRIM DS,SEPTRA DS) 800-160 MG tablet     1 tablet Oral 2 times daily 03/12/17 0859     03/11/17 0800  piperacillin-tazobactam (ZOSYN) IVPB 3.375 g     3.375 g 12.5 mL/hr over 240 Minutes Intravenous Every 8 hours 03/11/17 0719     03/09/17 1800  ceFAZolin (ANCEF) IVPB 1 g/50 mL premix  Status:  Discontinued     1 g 100 mL/hr over 30 Minutes Intravenous Every 6 hours 03/09/17 1653 03/11/17 0719   03/09/17 1240  gentamicin (GARAMYCIN) injection  Status:  Discontinued       As needed 03/09/17 1256 03/09/17 1323   03/09/17 1240  vancomycin (VANCOCIN) powder  Status:  Discontinued       As needed 03/09/17 1257 03/09/17 1323   03/09/17 0817  ceFAZolin (ANCEF) IVPB 2g/100 mL premix     2 g 200 mL/hr over 30 Minutes Intravenous On call to O.R. 03/09/17 3151 03/09/17 1242    .  Patient was given sequential compression devices, early ambulation, and aspirin for DVT prophylaxis.  Recent vital signs:  Patient Vitals for the past 24 hrs:  BP Temp Temp src Pulse Resp SpO2  03/14/17 0547 (!) 149/51 98.4 F  (36.9 C) Oral 77 16 94 %  03/13/17 1336 (!) 137/45 98.1 F (36.7 C) Oral 83 16 100 %  03/13/17 0725 (!) 160/54 98.7 F (37.1 C) Oral 78 16 99 %  .  Recent laboratory studies: No results found.  Discharge Medications:   Allergies as of 03/14/2017      Reactions   Codeine Other (See Comments)   Altered mental status      Medication List    TAKE these medications   acetaminophen 500 MG tablet Commonly known as:  TYLENOL Take 1 tablet (500 mg total) by mouth every 6 (six) hours as needed for mild pain.   aspirin EC 81 MG tablet Take 1 tablet (81 mg total) by mouth daily.   atorvastatin 20 MG tablet Commonly known as:  LIPITOR Take 20 mg by mouth daily.   calcium carbonate 600 MG Tabs tablet Commonly known as:  OS-CAL Take 600 mg by mouth 2 (two) times daily.   carvedilol 3.125 MG tablet Commonly known as:  COREG Take 1 tablet (3.125 mg total) by mouth 2 (two) times daily with a meal.   clopidogrel 75 MG tablet Commonly known as:  PLAVIX Take 1 tablet (75 mg total) by mouth daily.   HYDROcodone-acetaminophen 5-325 MG tablet Commonly known as:  NORCO/VICODIN Take 1 tablet by mouth every 4 (four) hours as needed for moderate pain.   losartan 50 MG tablet Commonly known as:  COZAAR  Take 1 tablet (50 mg total) by mouth daily. Hold until follow-up with your doctor or BP normal   multivitamin tablet Take 1 tablet by mouth daily.   phenol 1.4 % Liqd Commonly known as:  CHLORASEPTIC Use as directed 1 spray in the mouth or throat as needed for throat irritation / pain.   polyethylene glycol packet Commonly known as:  MIRALAX / GLYCOLAX Take 17 g by mouth daily as needed for moderate constipation.   senna-docusate 8.6-50 MG tablet Commonly known as:  Senokot-S Take 1 tablet by mouth 2 (two) times daily.   sulfamethoxazole-trimethoprim 800-160 MG tablet Commonly known as:  BACTRIM DS,SEPTRA DS Take 1 tablet by mouth 2 (two) times daily.   traMADol 50 MG  tablet Commonly known as:  ULTRAM Take 50 mg by mouth every 6 (six) hours as needed.       Diagnostic Studies: Dg C-arm 1-60 Min-no Report  Result Date: 03/09/2017 Fluoroscopy was utilized by the requesting physician.  No radiographic interpretation.   Xr Femur Min 2 Views Left  Result Date: 03/07/2017 2 view radiographs of the left femur shows good callus formation around the fracture site there has been some shortening through the fracture site.   Patient benefited maximally from their hospital stay and there were no complications.     Disposition: 03-Skilled Nursing Facility Discharge Instructions    Call MD / Call 911   Complete by:  As directed    If you experience chest pain or shortness of breath, CALL 911 and be transported to the hospital emergency room.  If you develope a fever above 101 F, pus (white drainage) or increased drainage or redness at the wound, or calf pain, call your surgeon's office.   Call MD / Call 911   Complete by:  As directed    If you experience chest pain or shortness of breath, CALL 911 and be transported to the hospital emergency room.  If you develope a fever above 101 F, pus (white drainage) or increased drainage or redness at the wound, or calf pain, call your surgeon's office.   Constipation Prevention   Complete by:  As directed    Drink plenty of fluids.  Prune juice may be helpful.  You may use a stool softener, such as Colace (over the counter) 100 mg twice a day.  Use MiraLax (over the counter) for constipation as needed.   Constipation Prevention   Complete by:  As directed    Drink plenty of fluids.  Prune juice may be helpful.  You may use a stool softener, such as Colace (over the counter) 100 mg twice a day.  Use MiraLax (over the counter) for constipation as needed.   Diet - low sodium heart healthy   Complete by:  As directed    Diet - low sodium heart healthy   Complete by:  As directed    Increase activity slowly as tolerated    Complete by:  As directed    Increase activity slowly as tolerated   Complete by:  As directed    Negative Pressure Wound Therapy - Incisional   Complete by:  As directed    Remove the hospital pump and attached to the portable Praveena pump.  This can be obtained from the operating room.  This will stay in place for 1 week.  The portable pump needs to be plugged in to ensure that it is charged.      Contact information for follow-up providers  [name], MD Follow up in 2 week(s).   Specialty:  Orthopedic Surgery Contact information: [place] [address] [phone]            Contact information for after-discharge care    Destination    [facility] .   Service:  Skilled Nursing Contact information: [address] [place] [phone]                   Signed: [name] 03/14/2017, 6:47 AM

## 2017-03-14 NOTE — Care Management Important Message (Signed)
Important Message  Patient Details  Name: Julia Mcconnell MRN: [id number] Date of Birth: January 14, 1935   Medicare Important Message Given:  Yes    Orbie Pyo 03/14/2017, 12:14 PM

## 2017-03-14 NOTE — Progress Notes (Signed)
Attempted to give facility report 3x, no answer. Will try to call again.

## 2017-03-14 NOTE — Clinical Social Work Placement (Signed)
   CLINICAL SOCIAL WORK PLACEMENT  NOTE  Date:  03/14/2017  Patient Details  Name: Julia Mcconnell MRN: [id number] Date of Birth: 1935/11/14  Clinical Social Work is seeking post-discharge placement for this patient at the [place] level of care (*CSW will initial, date and re-position this form in  chart as items are completed):  Yes   Patient/family provided with [facility] Work Department's list of facilities offering this level of care within the geographic area requested by the patient (or if unable, by the patient's family).  Yes   Patient/family informed of their freedom to choose among providers that offer the needed level of care, that participate in Medicare, Medicaid or managed care program needed by the patient, have an available bed and are willing to accept the patient.  Yes   Patient/family informed of [facility]'s ownership interest in [facility] and [facility], as well as of the fact that they are under no obligation to receive care at these facilities.  PASRR submitted to EDS on 03/10/17     PASRR number received on       Existing PASRR number confirmed on 03/10/17     FL2 transmitted to all facilities in geographic area requested by pt/family on       FL2 transmitted to all facilities within larger geographic area on       Patient informed that his/her managed care company has contracts with or will negotiate with certain facilities, including the following:        Yes   Patient/family informed of bed offers received.  Patient chooses bed at [facility]     Physician recommends and patient chooses bed at      Patient to be transferred to [facility] on 03/14/17.  Patient to be transferred to facility by PTAR     Patient family notified on 03/14/17 of transfer.  Name of family member notified:  pt responsible for self     PHYSICIAN       Additional Comment:     _______________________________________________ Normajean Baxter, LCSW 03/14/2017, 9:39 AM

## 2017-03-15 ENCOUNTER — Telehealth (INDEPENDENT_AMBULATORY_CARE_PROVIDER_SITE_OTHER): Payer: Self-pay | Admitting: *Deleted

## 2017-03-15 NOTE — Telephone Encounter (Signed)
I called and sw Shawn to advise that per Dr. Sharol Given that she can not be wtb on the surgical leg. She can do any other therapy. Pt has an appt on 03/21/17 will re-eval and update at that time.

## 2017-03-15 NOTE — Telephone Encounter (Signed)
Received vm on triage from [facility] on this pt stating pt came in yesterday from hospital for a revision L hip and there were not d/c instructions has far as weight bearing or restrictions. He states the pt is under the impression non wt bearing for 3 weeks but he does not see that written anywhere.   Please call him directly at [phone]

## 2017-03-16 DIAGNOSIS — Z9889 Other specified postprocedural states: Secondary | ICD-10-CM | POA: Diagnosis not present

## 2017-03-16 DIAGNOSIS — M25552 Pain in left hip: Secondary | ICD-10-CM | POA: Diagnosis not present

## 2017-03-16 DIAGNOSIS — I10 Essential (primary) hypertension: Secondary | ICD-10-CM | POA: Diagnosis not present

## 2017-03-16 DIAGNOSIS — T8149XA Infection following a procedure, other surgical site, initial encounter: Secondary | ICD-10-CM | POA: Diagnosis not present

## 2017-03-16 DIAGNOSIS — Z8673 Personal history of transient ischemic attack (TIA), and cerebral infarction without residual deficits: Secondary | ICD-10-CM | POA: Diagnosis not present

## 2017-03-16 DIAGNOSIS — R2681 Unsteadiness on feet: Secondary | ICD-10-CM | POA: Diagnosis not present

## 2017-03-18 DIAGNOSIS — M25552 Pain in left hip: Secondary | ICD-10-CM | POA: Diagnosis not present

## 2017-03-18 DIAGNOSIS — R2681 Unsteadiness on feet: Secondary | ICD-10-CM | POA: Diagnosis not present

## 2017-03-21 ENCOUNTER — Encounter (INDEPENDENT_AMBULATORY_CARE_PROVIDER_SITE_OTHER): Payer: Self-pay | Admitting: Orthopedic Surgery

## 2017-03-21 ENCOUNTER — Ambulatory Visit (INDEPENDENT_AMBULATORY_CARE_PROVIDER_SITE_OTHER): Payer: Medicare Other | Admitting: Orthopedic Surgery

## 2017-03-21 VITALS — Ht <= 58 in | Wt 128.0 lb

## 2017-03-21 DIAGNOSIS — T847XXS Infection and inflammatory reaction due to other internal orthopedic prosthetic devices, implants and grafts, sequela: Secondary | ICD-10-CM
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
## 2017-03-22 DIAGNOSIS — M25552 Pain in left hip: Secondary | ICD-10-CM | POA: Diagnosis not present

## 2017-03-22 DIAGNOSIS — R2681 Unsteadiness on feet: Secondary | ICD-10-CM | POA: Diagnosis not present

## 2017-03-23 DIAGNOSIS — S72002A Fracture of unspecified part of neck of left femur, initial encounter for closed fracture: Secondary | ICD-10-CM | POA: Diagnosis not present

## 2017-03-23 DIAGNOSIS — T8149XA Infection following a procedure, other surgical site, initial encounter: Secondary | ICD-10-CM | POA: Diagnosis not present

## 2017-03-23 DIAGNOSIS — S7292XA Unspecified fracture of left femur, initial encounter for closed fracture: Secondary | ICD-10-CM | POA: Diagnosis not present

## 2017-03-24 ENCOUNTER — Telehealth (INDEPENDENT_AMBULATORY_CARE_PROVIDER_SITE_OTHER): Payer: Self-pay | Admitting: Orthopedic Surgery

## 2017-03-24 DIAGNOSIS — M25552 Pain in left hip: Secondary | ICD-10-CM | POA: Diagnosis not present

## 2017-03-24 DIAGNOSIS — R2681 Unsteadiness on feet: Secondary | ICD-10-CM | POA: Diagnosis not present

## 2017-03-24 NOTE — Telephone Encounter (Signed)
error 

## 2017-03-25 ENCOUNTER — Telehealth (INDEPENDENT_AMBULATORY_CARE_PROVIDER_SITE_OTHER): Payer: Self-pay

## 2017-03-25 NOTE — Telephone Encounter (Signed)
Tonya from [facility] called to report that the rounding doctor there ordered a new hip xray and they needed to know if the fracture seen on this xray is new or old.  We saw the patient 03/21/17 and did xrays then.  She has been NWB, as instructed by Dr. Sharol Given.  The doctor that ordered the xray was not there on site at the time of this call.  I gave Tonya Dr. Jess Barters cell number to ask him directly about this (he is performing surgeries at [facility] today).

## 2017-03-29 ENCOUNTER — Ambulatory Visit (INDEPENDENT_AMBULATORY_CARE_PROVIDER_SITE_OTHER): Payer: No Typology Code available for payment source | Admitting: Orthopedic Surgery

## 2017-03-29 ENCOUNTER — Encounter (INDEPENDENT_AMBULATORY_CARE_PROVIDER_SITE_OTHER): Payer: Self-pay | Admitting: Orthopedic Surgery

## 2017-03-29 ENCOUNTER — Ambulatory Visit (INDEPENDENT_AMBULATORY_CARE_PROVIDER_SITE_OTHER): Payer: No Typology Code available for payment source

## 2017-03-29 VITALS — Ht <= 58 in | Wt 128.0 lb

## 2017-03-29 DIAGNOSIS — M25552 Pain in left hip: Secondary | ICD-10-CM

## 2017-03-29 DIAGNOSIS — R2681 Unsteadiness on feet: Secondary | ICD-10-CM | POA: Diagnosis not present

## 2017-03-29 DIAGNOSIS — T847XXS Infection and inflammatory reaction due to other internal orthopedic prosthetic devices, implants and grafts, sequela: Secondary | ICD-10-CM

## 2017-03-29 DIAGNOSIS — S72002D Fracture of unspecified part of neck of left femur, subsequent encounter for closed fracture with routine healing: Secondary | ICD-10-CM
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
## 2017-04-04 DIAGNOSIS — R2681 Unsteadiness on feet: Secondary | ICD-10-CM | POA: Diagnosis not present

## 2017-04-04 DIAGNOSIS — M25552 Pain in left hip: Secondary | ICD-10-CM | POA: Diagnosis not present

## 2017-04-07 DIAGNOSIS — M25552 Pain in left hip: Secondary | ICD-10-CM | POA: Diagnosis not present

## 2017-04-07 DIAGNOSIS — R2681 Unsteadiness on feet: Secondary | ICD-10-CM | POA: Diagnosis not present

## 2017-04-12 ENCOUNTER — Telehealth (INDEPENDENT_AMBULATORY_CARE_PROVIDER_SITE_OTHER): Payer: Self-pay | Admitting: Orthopedic Surgery

## 2017-04-12 DIAGNOSIS — M25561 Pain in right knee: Secondary | ICD-10-CM | POA: Diagnosis not present

## 2017-04-12 DIAGNOSIS — R2681 Unsteadiness on feet: Secondary | ICD-10-CM | POA: Diagnosis not present

## 2017-04-12 DIAGNOSIS — M25552 Pain in left hip: Secondary | ICD-10-CM | POA: Diagnosis not present

## 2017-04-12 NOTE — Telephone Encounter (Signed)
Patient called asking if Dr. Sharol Given would suggest her get a knee brace? She said its pretty painful, its popping. CB # [phone]

## 2017-04-13 NOTE — Telephone Encounter (Signed)
Patient has an appt with Dr. Sharol Given on 04/19/17 can discuss at appt. Have tried to call the pt and message states that mail box has not been set up yet. Can discuss and eval knee at office visit Tuesday

## 2017-04-14 DIAGNOSIS — R2681 Unsteadiness on feet: Secondary | ICD-10-CM | POA: Diagnosis not present

## 2017-04-14 DIAGNOSIS — M25561 Pain in right knee: Secondary | ICD-10-CM | POA: Diagnosis not present

## 2017-04-14 DIAGNOSIS — M25552 Pain in left hip: Secondary | ICD-10-CM | POA: Diagnosis not present

## 2017-04-18 DIAGNOSIS — H109 Unspecified conjunctivitis: Secondary | ICD-10-CM | POA: Diagnosis not present

## 2017-04-18 DIAGNOSIS — L309 Dermatitis, unspecified: Secondary | ICD-10-CM | POA: Diagnosis not present

## 2017-04-18 DIAGNOSIS — H00033 Abscess of eyelid right eye, unspecified eyelid: Secondary | ICD-10-CM | POA: Diagnosis not present

## 2017-04-18 DIAGNOSIS — M25552 Pain in left hip: Secondary | ICD-10-CM | POA: Diagnosis not present

## 2017-04-18 DIAGNOSIS — M25561 Pain in right knee: Secondary | ICD-10-CM | POA: Diagnosis not present

## 2017-04-18 DIAGNOSIS — R2681 Unsteadiness on feet: Secondary | ICD-10-CM | POA: Diagnosis not present

## 2017-04-19 ENCOUNTER — Ambulatory Visit (INDEPENDENT_AMBULATORY_CARE_PROVIDER_SITE_OTHER): Payer: No Typology Code available for payment source

## 2017-04-19 ENCOUNTER — Encounter (INDEPENDENT_AMBULATORY_CARE_PROVIDER_SITE_OTHER): Payer: Self-pay | Admitting: Orthopedic Surgery

## 2017-04-19 ENCOUNTER — Ambulatory Visit (INDEPENDENT_AMBULATORY_CARE_PROVIDER_SITE_OTHER): Payer: Medicare Other | Admitting: Orthopedic Surgery

## 2017-04-19 DIAGNOSIS — S72002D Fracture of unspecified part of neck of left femur, subsequent encounter for closed fracture with routine healing: Secondary | ICD-10-CM | POA: Diagnosis not present

## 2017-04-19 DIAGNOSIS — M25552 Pain in left hip: Secondary | ICD-10-CM

## 2017-04-19 DIAGNOSIS — M1711 Unilateral primary osteoarthritis, right knee: Secondary | ICD-10-CM | POA: Diagnosis not present

## 2017-04-19 DIAGNOSIS — T847XXS Infection and inflammatory reaction due to other internal orthopedic prosthetic devices, implants and grafts, sequela: Secondary | ICD-10-CM

## 2017-04-19 MED ORDER — LIDOCAINE HCL 1 % IJ SOLN
5.0000 mL | INTRAMUSCULAR | Status: AC | PRN
  Administered 2017-04-19: 5 mL

## 2017-04-19 MED ORDER — METHYLPREDNISOLONE ACETATE 40 MG/ML IJ SUSP
40.0000 mg | INTRAMUSCULAR | Status: AC | PRN
  Administered 2017-04-19: 40 mg via INTRA_ARTICULAR
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
## 2017-04-20 DIAGNOSIS — M25561 Pain in right knee: Secondary | ICD-10-CM | POA: Diagnosis not present

## 2017-04-20 DIAGNOSIS — M25552 Pain in left hip: Secondary | ICD-10-CM | POA: Diagnosis not present

## 2017-04-20 DIAGNOSIS — R2681 Unsteadiness on feet: Secondary | ICD-10-CM | POA: Diagnosis not present

## 2017-04-22 DIAGNOSIS — M25561 Pain in right knee: Secondary | ICD-10-CM | POA: Diagnosis not present

## 2017-04-22 DIAGNOSIS — M25552 Pain in left hip: Secondary | ICD-10-CM | POA: Diagnosis not present

## 2017-04-22 DIAGNOSIS — R2681 Unsteadiness on feet: Secondary | ICD-10-CM | POA: Diagnosis not present

## 2017-04-27 DIAGNOSIS — M25561 Pain in right knee: Secondary | ICD-10-CM | POA: Diagnosis not present

## 2017-04-27 DIAGNOSIS — R2681 Unsteadiness on feet: Secondary | ICD-10-CM | POA: Diagnosis not present

## 2017-04-27 DIAGNOSIS — M25552 Pain in left hip: Secondary | ICD-10-CM | POA: Diagnosis not present

## 2017-05-02 ENCOUNTER — Telehealth (INDEPENDENT_AMBULATORY_CARE_PROVIDER_SITE_OTHER): Payer: Self-pay | Admitting: Orthopedic Surgery

## 2017-05-02 DIAGNOSIS — M25552 Pain in left hip: Secondary | ICD-10-CM | POA: Diagnosis not present

## 2017-05-02 DIAGNOSIS — R2681 Unsteadiness on feet: Secondary | ICD-10-CM | POA: Diagnosis not present

## 2017-05-02 DIAGNOSIS — M25561 Pain in right knee: Secondary | ICD-10-CM | POA: Diagnosis not present

## 2017-05-02 NOTE — Telephone Encounter (Signed)
Gabriel Cirri can you call?

## 2017-05-02 NOTE — Telephone Encounter (Signed)
IC pt back and she stated she is still having pain in her R knee, she was in office on 04/19/17 and was given injection at that time but would like to have another one if possible. I advised pt Dr. Sharol Given does not have any openings this week but would let his assist know if there is anything else that could be done. Pt voiced understanding and will try to also call back tomorrow am.   I called one of the assistance to see if I could bring pt in for a 2nd injection and she advised me if pt had a knee injection that she would have to wait 3 months or could make ov for another evaluation. I tried calling pt and left message on vm to return my call.

## 2017-05-02 NOTE — Telephone Encounter (Signed)
Patient would like a call back in regards to her right leg, she states she can barely walk on it. CB # [phone]

## 2017-05-03 ENCOUNTER — Telehealth (INDEPENDENT_AMBULATORY_CARE_PROVIDER_SITE_OTHER): Payer: Self-pay

## 2017-05-03 NOTE — Telephone Encounter (Signed)
Patient called concerning her right knee.  Advised her message per Gabriel Cirri S.concerning her right leg/knee.  Patient stated that she didn't receive a VM, but she will give her right knee a little more time and if she needs to come back before her scheduled appt.she will give [place] a call.

## 2017-05-04 DIAGNOSIS — M25552 Pain in left hip: Secondary | ICD-10-CM | POA: Diagnosis not present

## 2017-05-04 DIAGNOSIS — M25561 Pain in right knee: Secondary | ICD-10-CM | POA: Diagnosis not present

## 2017-05-04 DIAGNOSIS — R2681 Unsteadiness on feet: Secondary | ICD-10-CM | POA: Diagnosis not present

## 2017-05-04 DIAGNOSIS — K5900 Constipation, unspecified: Secondary | ICD-10-CM | POA: Diagnosis not present

## 2017-05-06 DIAGNOSIS — R2681 Unsteadiness on feet: Secondary | ICD-10-CM | POA: Diagnosis not present

## 2017-05-06 DIAGNOSIS — M25561 Pain in right knee: Secondary | ICD-10-CM | POA: Diagnosis not present

## 2017-05-06 DIAGNOSIS — M25552 Pain in left hip: Secondary | ICD-10-CM | POA: Diagnosis not present

## 2017-05-06 DIAGNOSIS — K5900 Constipation, unspecified: Secondary | ICD-10-CM | POA: Diagnosis not present

## 2017-05-11 DIAGNOSIS — R2681 Unsteadiness on feet: Secondary | ICD-10-CM | POA: Diagnosis not present

## 2017-05-11 DIAGNOSIS — M25561 Pain in right knee: Secondary | ICD-10-CM | POA: Diagnosis not present

## 2017-05-11 DIAGNOSIS — M25552 Pain in left hip: Secondary | ICD-10-CM | POA: Diagnosis not present

## 2017-05-12 DIAGNOSIS — R112 Nausea with vomiting, unspecified: Secondary | ICD-10-CM | POA: Diagnosis not present

## 2017-05-12 DIAGNOSIS — S7292XA Unspecified fracture of left femur, initial encounter for closed fracture: Secondary | ICD-10-CM | POA: Diagnosis not present

## 2017-05-12 DIAGNOSIS — I10 Essential (primary) hypertension: Secondary | ICD-10-CM | POA: Diagnosis not present

## 2017-05-12 DIAGNOSIS — D649 Anemia, unspecified: Secondary | ICD-10-CM | POA: Diagnosis not present

## 2017-05-13 DIAGNOSIS — R2681 Unsteadiness on feet: Secondary | ICD-10-CM | POA: Diagnosis not present

## 2017-05-13 DIAGNOSIS — M25561 Pain in right knee: Secondary | ICD-10-CM | POA: Diagnosis not present

## 2017-05-13 DIAGNOSIS — M25552 Pain in left hip: Secondary | ICD-10-CM | POA: Diagnosis not present

## 2017-05-19 ENCOUNTER — Encounter (INDEPENDENT_AMBULATORY_CARE_PROVIDER_SITE_OTHER): Payer: Self-pay | Admitting: Orthopedic Surgery

## 2017-05-19 ENCOUNTER — Ambulatory Visit (INDEPENDENT_AMBULATORY_CARE_PROVIDER_SITE_OTHER): Payer: Medicare Other | Admitting: Orthopedic Surgery

## 2017-05-19 DIAGNOSIS — R2681 Unsteadiness on feet: Secondary | ICD-10-CM | POA: Diagnosis not present

## 2017-05-19 DIAGNOSIS — M1711 Unilateral primary osteoarthritis, right knee: Secondary | ICD-10-CM | POA: Diagnosis not present

## 2017-05-19 DIAGNOSIS — M25552 Pain in left hip: Secondary | ICD-10-CM | POA: Diagnosis not present

## 2017-05-19 DIAGNOSIS — M25561 Pain in right knee: Secondary | ICD-10-CM | POA: Diagnosis not present

## 2017-05-19 MED ORDER — LIDOCAINE HCL 1 % IJ SOLN
5.0000 mL | INTRAMUSCULAR | Status: AC | PRN
  Administered 2017-05-19: 5 mL

## 2017-05-19 MED ORDER — METHYLPREDNISOLONE ACETATE 40 MG/ML IJ SUSP
40.0000 mg | INTRAMUSCULAR | Status: AC | PRN
  Administered 2017-05-19: 40 mg via INTRA_ARTICULAR
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
## 2017-05-20 DIAGNOSIS — G8929 Other chronic pain: Secondary | ICD-10-CM | POA: Diagnosis not present

## 2017-05-20 DIAGNOSIS — I10 Essential (primary) hypertension: Secondary | ICD-10-CM | POA: Diagnosis not present

## 2017-05-20 DIAGNOSIS — D649 Anemia, unspecified: Secondary | ICD-10-CM | POA: Diagnosis not present

## 2017-05-20 DIAGNOSIS — Z8673 Personal history of transient ischemic attack (TIA), and cerebral infarction without residual deficits: Secondary | ICD-10-CM | POA: Diagnosis not present

## 2017-05-20 DIAGNOSIS — E783 Hyperchylomicronemia: Secondary | ICD-10-CM | POA: Diagnosis not present

## 2017-05-20 DIAGNOSIS — G4733 Obstructive sleep apnea (adult) (pediatric): Secondary | ICD-10-CM | POA: Diagnosis not present

## 2017-05-20 DIAGNOSIS — M545 Low back pain: Secondary | ICD-10-CM | POA: Diagnosis not present

## 2017-05-20 DIAGNOSIS — Z79891 Long term (current) use of opiate analgesic: Secondary | ICD-10-CM | POA: Diagnosis not present

## 2017-05-20 DIAGNOSIS — Z9181 History of falling: Secondary | ICD-10-CM | POA: Diagnosis not present

## 2017-05-20 DIAGNOSIS — T84621D Infection and inflammatory reaction due to internal fixation device of left femur, subsequent encounter: Secondary | ICD-10-CM | POA: Diagnosis not present

## 2017-05-24 DIAGNOSIS — M810 Age-related osteoporosis without current pathological fracture: Secondary | ICD-10-CM | POA: Diagnosis not present

## 2017-05-24 DIAGNOSIS — I10 Essential (primary) hypertension: Secondary | ICD-10-CM | POA: Diagnosis not present

## 2017-05-25 DIAGNOSIS — G8929 Other chronic pain: Secondary | ICD-10-CM | POA: Diagnosis not present

## 2017-05-25 DIAGNOSIS — M545 Low back pain: Secondary | ICD-10-CM | POA: Diagnosis not present

## 2017-05-25 DIAGNOSIS — T84621D Infection and inflammatory reaction due to internal fixation device of left femur, subsequent encounter: Secondary | ICD-10-CM | POA: Diagnosis not present

## 2017-05-25 DIAGNOSIS — G4733 Obstructive sleep apnea (adult) (pediatric): Secondary | ICD-10-CM | POA: Diagnosis not present

## 2017-05-25 DIAGNOSIS — D649 Anemia, unspecified: Secondary | ICD-10-CM | POA: Diagnosis not present

## 2017-05-25 DIAGNOSIS — I10 Essential (primary) hypertension: Secondary | ICD-10-CM | POA: Diagnosis not present

## 2017-05-26 DIAGNOSIS — G8929 Other chronic pain: Secondary | ICD-10-CM | POA: Diagnosis not present

## 2017-05-26 DIAGNOSIS — G4733 Obstructive sleep apnea (adult) (pediatric): Secondary | ICD-10-CM | POA: Diagnosis not present

## 2017-05-26 DIAGNOSIS — M545 Low back pain: Secondary | ICD-10-CM | POA: Diagnosis not present

## 2017-05-26 DIAGNOSIS — I10 Essential (primary) hypertension: Secondary | ICD-10-CM | POA: Diagnosis not present

## 2017-05-26 DIAGNOSIS — T84621D Infection and inflammatory reaction due to internal fixation device of left femur, subsequent encounter: Secondary | ICD-10-CM | POA: Diagnosis not present

## 2017-05-26 DIAGNOSIS — D649 Anemia, unspecified: Secondary | ICD-10-CM | POA: Diagnosis not present

## 2017-05-27 DIAGNOSIS — G8929 Other chronic pain: Secondary | ICD-10-CM | POA: Diagnosis not present

## 2017-05-27 DIAGNOSIS — D649 Anemia, unspecified: Secondary | ICD-10-CM | POA: Diagnosis not present

## 2017-05-27 DIAGNOSIS — M545 Low back pain: Secondary | ICD-10-CM | POA: Diagnosis not present

## 2017-05-27 DIAGNOSIS — T84621D Infection and inflammatory reaction due to internal fixation device of left femur, subsequent encounter: Secondary | ICD-10-CM | POA: Diagnosis not present

## 2017-05-27 DIAGNOSIS — G4733 Obstructive sleep apnea (adult) (pediatric): Secondary | ICD-10-CM | POA: Diagnosis not present

## 2017-05-27 DIAGNOSIS — I10 Essential (primary) hypertension: Secondary | ICD-10-CM | POA: Diagnosis not present

## 2017-05-31 DIAGNOSIS — G8929 Other chronic pain: Secondary | ICD-10-CM | POA: Diagnosis not present

## 2017-05-31 DIAGNOSIS — I10 Essential (primary) hypertension: Secondary | ICD-10-CM | POA: Diagnosis not present

## 2017-05-31 DIAGNOSIS — G4733 Obstructive sleep apnea (adult) (pediatric): Secondary | ICD-10-CM | POA: Diagnosis not present

## 2017-05-31 DIAGNOSIS — D649 Anemia, unspecified: Secondary | ICD-10-CM | POA: Diagnosis not present

## 2017-05-31 DIAGNOSIS — M545 Low back pain: Secondary | ICD-10-CM | POA: Diagnosis not present

## 2017-05-31 DIAGNOSIS — T84621D Infection and inflammatory reaction due to internal fixation device of left femur, subsequent encounter: Secondary | ICD-10-CM | POA: Diagnosis not present

## 2017-06-02 DIAGNOSIS — D649 Anemia, unspecified: Secondary | ICD-10-CM | POA: Diagnosis not present

## 2017-06-02 DIAGNOSIS — I10 Essential (primary) hypertension: Secondary | ICD-10-CM | POA: Diagnosis not present

## 2017-06-02 DIAGNOSIS — M545 Low back pain: Secondary | ICD-10-CM | POA: Diagnosis not present

## 2017-06-02 DIAGNOSIS — T84621D Infection and inflammatory reaction due to internal fixation device of left femur, subsequent encounter: Secondary | ICD-10-CM | POA: Diagnosis not present

## 2017-06-02 DIAGNOSIS — G8929 Other chronic pain: Secondary | ICD-10-CM | POA: Diagnosis not present

## 2017-06-02 DIAGNOSIS — G4733 Obstructive sleep apnea (adult) (pediatric): Secondary | ICD-10-CM | POA: Diagnosis not present

## 2017-06-03 DIAGNOSIS — T84621D Infection and inflammatory reaction due to internal fixation device of left femur, subsequent encounter: Secondary | ICD-10-CM | POA: Diagnosis not present

## 2017-06-03 DIAGNOSIS — G4733 Obstructive sleep apnea (adult) (pediatric): Secondary | ICD-10-CM | POA: Diagnosis not present

## 2017-06-03 DIAGNOSIS — D649 Anemia, unspecified: Secondary | ICD-10-CM | POA: Diagnosis not present

## 2017-06-03 DIAGNOSIS — M545 Low back pain: Secondary | ICD-10-CM | POA: Diagnosis not present

## 2017-06-03 DIAGNOSIS — G8929 Other chronic pain: Secondary | ICD-10-CM | POA: Diagnosis not present

## 2017-06-03 DIAGNOSIS — I10 Essential (primary) hypertension: Secondary | ICD-10-CM | POA: Diagnosis not present

## 2017-06-07 DIAGNOSIS — T84621D Infection and inflammatory reaction due to internal fixation device of left femur, subsequent encounter: Secondary | ICD-10-CM | POA: Diagnosis not present

## 2017-06-07 DIAGNOSIS — G8929 Other chronic pain: Secondary | ICD-10-CM | POA: Diagnosis not present

## 2017-06-07 DIAGNOSIS — Z8781 Personal history of (healed) traumatic fracture: Secondary | ICD-10-CM | POA: Diagnosis not present

## 2017-06-07 DIAGNOSIS — D649 Anemia, unspecified: Secondary | ICD-10-CM | POA: Diagnosis not present

## 2017-06-07 DIAGNOSIS — G4733 Obstructive sleep apnea (adult) (pediatric): Secondary | ICD-10-CM | POA: Diagnosis not present

## 2017-06-07 DIAGNOSIS — M545 Low back pain: Secondary | ICD-10-CM | POA: Diagnosis not present

## 2017-06-07 DIAGNOSIS — I10 Essential (primary) hypertension: Secondary | ICD-10-CM | POA: Diagnosis not present

## 2017-06-09 DIAGNOSIS — M545 Low back pain: Secondary | ICD-10-CM | POA: Diagnosis not present

## 2017-06-09 DIAGNOSIS — G4733 Obstructive sleep apnea (adult) (pediatric): Secondary | ICD-10-CM | POA: Diagnosis not present

## 2017-06-09 DIAGNOSIS — I10 Essential (primary) hypertension: Secondary | ICD-10-CM | POA: Diagnosis not present

## 2017-06-09 DIAGNOSIS — T84621D Infection and inflammatory reaction due to internal fixation device of left femur, subsequent encounter: Secondary | ICD-10-CM | POA: Diagnosis not present

## 2017-06-09 DIAGNOSIS — G8929 Other chronic pain: Secondary | ICD-10-CM | POA: Diagnosis not present

## 2017-06-09 DIAGNOSIS — D649 Anemia, unspecified: Secondary | ICD-10-CM | POA: Diagnosis not present

## 2017-06-13 DIAGNOSIS — H40023 Open angle with borderline findings, high risk, bilateral: Secondary | ICD-10-CM | POA: Diagnosis not present

## 2017-06-13 DIAGNOSIS — H179 Unspecified corneal scar and opacity: Secondary | ICD-10-CM | POA: Diagnosis not present

## 2017-06-13 DIAGNOSIS — H01009 Unspecified blepharitis unspecified eye, unspecified eyelid: Secondary | ICD-10-CM | POA: Diagnosis not present

## 2017-06-13 DIAGNOSIS — H04123 Dry eye syndrome of bilateral lacrimal glands: Secondary | ICD-10-CM | POA: Diagnosis not present

## 2017-06-14 DIAGNOSIS — M545 Low back pain: Secondary | ICD-10-CM | POA: Diagnosis not present

## 2017-06-14 DIAGNOSIS — T84621D Infection and inflammatory reaction due to internal fixation device of left femur, subsequent encounter: Secondary | ICD-10-CM | POA: Diagnosis not present

## 2017-06-14 DIAGNOSIS — G4733 Obstructive sleep apnea (adult) (pediatric): Secondary | ICD-10-CM | POA: Diagnosis not present

## 2017-06-14 DIAGNOSIS — I10 Essential (primary) hypertension: Secondary | ICD-10-CM | POA: Diagnosis not present

## 2017-06-14 DIAGNOSIS — D649 Anemia, unspecified: Secondary | ICD-10-CM | POA: Diagnosis not present

## 2017-06-14 DIAGNOSIS — G8929 Other chronic pain: Secondary | ICD-10-CM | POA: Diagnosis not present

## 2017-06-16 ENCOUNTER — Telehealth (INDEPENDENT_AMBULATORY_CARE_PROVIDER_SITE_OTHER): Payer: Self-pay | Admitting: Orthopedic Surgery

## 2017-06-16 ENCOUNTER — Ambulatory Visit (INDEPENDENT_AMBULATORY_CARE_PROVIDER_SITE_OTHER): Payer: Medicare Other | Admitting: Orthopedic Surgery

## 2017-06-16 ENCOUNTER — Encounter (INDEPENDENT_AMBULATORY_CARE_PROVIDER_SITE_OTHER): Payer: Self-pay | Admitting: Orthopedic Surgery

## 2017-06-16 VITALS — Ht <= 58 in | Wt 128.0 lb

## 2017-06-16 DIAGNOSIS — I10 Essential (primary) hypertension: Secondary | ICD-10-CM | POA: Diagnosis not present

## 2017-06-16 DIAGNOSIS — M1711 Unilateral primary osteoarthritis, right knee: Secondary | ICD-10-CM

## 2017-06-16 DIAGNOSIS — D649 Anemia, unspecified: Secondary | ICD-10-CM | POA: Diagnosis not present

## 2017-06-16 DIAGNOSIS — T84621D Infection and inflammatory reaction due to internal fixation device of left femur, subsequent encounter: Secondary | ICD-10-CM | POA: Diagnosis not present

## 2017-06-16 DIAGNOSIS — G8929 Other chronic pain: Secondary | ICD-10-CM | POA: Diagnosis not present

## 2017-06-16 DIAGNOSIS — M545 Low back pain: Secondary | ICD-10-CM | POA: Diagnosis not present

## 2017-06-16 DIAGNOSIS — G4733 Obstructive sleep apnea (adult) (pediatric): Secondary | ICD-10-CM | POA: Diagnosis not present

## 2017-06-16 MED ORDER — METHYLPREDNISOLONE ACETATE 40 MG/ML IJ SUSP
40.0000 mg | INTRAMUSCULAR | Status: AC | PRN
  Administered 2017-06-16: 40 mg via INTRA_ARTICULAR

## 2017-06-16 MED ORDER — LIDOCAINE HCL 1 % IJ SOLN
5.0000 mL | INTRAMUSCULAR | Status: AC | PRN
  Administered 2017-06-16: 5 mL
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
## 2017-06-16 NOTE — Telephone Encounter (Signed)
Julia Mcconnell -(PT) with [facility] called advised the pain is not well controlled at all. Patient is just walking 15 feet at a time because of the pain. Patient is now using wheelchair to move around. The number to contact Julia Mcconnell is [phone]

## 2017-06-17 NOTE — Telephone Encounter (Signed)
I called and lm on vm to advise that we saw the pt in the office yesterday and that she was given a cortisone injection in the right knee. May proceed with gel injections depending on how she responds to the cortisone. To call with any questions.

## 2017-06-20 DIAGNOSIS — T84621D Infection and inflammatory reaction due to internal fixation device of left femur, subsequent encounter: Secondary | ICD-10-CM | POA: Diagnosis not present

## 2017-06-20 DIAGNOSIS — G8929 Other chronic pain: Secondary | ICD-10-CM | POA: Diagnosis not present

## 2017-06-20 DIAGNOSIS — D649 Anemia, unspecified: Secondary | ICD-10-CM | POA: Diagnosis not present

## 2017-06-20 DIAGNOSIS — M545 Low back pain: Secondary | ICD-10-CM | POA: Diagnosis not present

## 2017-06-20 DIAGNOSIS — I10 Essential (primary) hypertension: Secondary | ICD-10-CM | POA: Diagnosis not present

## 2017-06-20 DIAGNOSIS — G4733 Obstructive sleep apnea (adult) (pediatric): Secondary | ICD-10-CM | POA: Diagnosis not present

## 2017-06-21 DIAGNOSIS — D649 Anemia, unspecified: Secondary | ICD-10-CM | POA: Diagnosis not present

## 2017-06-21 DIAGNOSIS — T84621D Infection and inflammatory reaction due to internal fixation device of left femur, subsequent encounter: Secondary | ICD-10-CM | POA: Diagnosis not present

## 2017-06-21 DIAGNOSIS — M545 Low back pain: Secondary | ICD-10-CM | POA: Diagnosis not present

## 2017-06-21 DIAGNOSIS — G4733 Obstructive sleep apnea (adult) (pediatric): Secondary | ICD-10-CM | POA: Diagnosis not present

## 2017-06-21 DIAGNOSIS — G8929 Other chronic pain: Secondary | ICD-10-CM | POA: Diagnosis not present

## 2017-06-21 DIAGNOSIS — I10 Essential (primary) hypertension: Secondary | ICD-10-CM | POA: Diagnosis not present

## 2017-06-22 ENCOUNTER — Encounter

## 2017-06-22 ENCOUNTER — Ambulatory Visit (INDEPENDENT_AMBULATORY_CARE_PROVIDER_SITE_OTHER): Payer: Medicare Other | Admitting: Physical Medicine and Rehabilitation

## 2017-06-22 DIAGNOSIS — M545 Low back pain: Secondary | ICD-10-CM | POA: Diagnosis not present

## 2017-06-22 DIAGNOSIS — D649 Anemia, unspecified: Secondary | ICD-10-CM | POA: Diagnosis not present

## 2017-06-22 DIAGNOSIS — T84621D Infection and inflammatory reaction due to internal fixation device of left femur, subsequent encounter: Secondary | ICD-10-CM | POA: Diagnosis not present

## 2017-06-22 DIAGNOSIS — G4733 Obstructive sleep apnea (adult) (pediatric): Secondary | ICD-10-CM | POA: Diagnosis not present

## 2017-06-22 DIAGNOSIS — I10 Essential (primary) hypertension: Secondary | ICD-10-CM | POA: Diagnosis not present

## 2017-06-22 DIAGNOSIS — G8929 Other chronic pain: Secondary | ICD-10-CM | POA: Diagnosis not present

## 2017-06-23 DIAGNOSIS — D649 Anemia, unspecified: Secondary | ICD-10-CM | POA: Diagnosis not present

## 2017-06-23 DIAGNOSIS — I10 Essential (primary) hypertension: Secondary | ICD-10-CM | POA: Diagnosis not present

## 2017-06-23 DIAGNOSIS — G4733 Obstructive sleep apnea (adult) (pediatric): Secondary | ICD-10-CM | POA: Diagnosis not present

## 2017-06-23 DIAGNOSIS — M545 Low back pain: Secondary | ICD-10-CM | POA: Diagnosis not present

## 2017-06-23 DIAGNOSIS — T84621D Infection and inflammatory reaction due to internal fixation device of left femur, subsequent encounter: Secondary | ICD-10-CM | POA: Diagnosis not present

## 2017-06-23 DIAGNOSIS — G8929 Other chronic pain: Secondary | ICD-10-CM | POA: Diagnosis not present

## 2017-06-23 DIAGNOSIS — F329 Major depressive disorder, single episode, unspecified: Secondary | ICD-10-CM | POA: Diagnosis not present

## 2017-06-27 DIAGNOSIS — G8929 Other chronic pain: Secondary | ICD-10-CM | POA: Diagnosis not present

## 2017-06-27 DIAGNOSIS — I10 Essential (primary) hypertension: Secondary | ICD-10-CM | POA: Diagnosis not present

## 2017-06-27 DIAGNOSIS — M545 Low back pain: Secondary | ICD-10-CM | POA: Diagnosis not present

## 2017-06-27 DIAGNOSIS — T84621D Infection and inflammatory reaction due to internal fixation device of left femur, subsequent encounter: Secondary | ICD-10-CM | POA: Diagnosis not present

## 2017-06-27 DIAGNOSIS — D649 Anemia, unspecified: Secondary | ICD-10-CM | POA: Diagnosis not present

## 2017-06-27 DIAGNOSIS — G4733 Obstructive sleep apnea (adult) (pediatric): Secondary | ICD-10-CM | POA: Diagnosis not present

## 2017-06-28 DIAGNOSIS — D649 Anemia, unspecified: Secondary | ICD-10-CM | POA: Diagnosis not present

## 2017-06-28 DIAGNOSIS — G8929 Other chronic pain: Secondary | ICD-10-CM | POA: Diagnosis not present

## 2017-06-28 DIAGNOSIS — T84621D Infection and inflammatory reaction due to internal fixation device of left femur, subsequent encounter: Secondary | ICD-10-CM | POA: Diagnosis not present

## 2017-06-28 DIAGNOSIS — G4733 Obstructive sleep apnea (adult) (pediatric): Secondary | ICD-10-CM | POA: Diagnosis not present

## 2017-06-28 DIAGNOSIS — M545 Low back pain: Secondary | ICD-10-CM | POA: Diagnosis not present

## 2017-06-28 DIAGNOSIS — I10 Essential (primary) hypertension: Secondary | ICD-10-CM | POA: Diagnosis not present

## 2017-06-29 ENCOUNTER — Other Ambulatory Visit (HOSPITAL_COMMUNITY): Payer: Self-pay | Admitting: Cardiovascular Disease

## 2017-06-29 NOTE — Telephone Encounter (Signed)
Rx request sent to pharmacy.  

## 2017-06-30 DIAGNOSIS — D649 Anemia, unspecified: Secondary | ICD-10-CM | POA: Diagnosis not present

## 2017-06-30 DIAGNOSIS — G4733 Obstructive sleep apnea (adult) (pediatric): Secondary | ICD-10-CM | POA: Diagnosis not present

## 2017-06-30 DIAGNOSIS — M545 Low back pain: Secondary | ICD-10-CM | POA: Diagnosis not present

## 2017-06-30 DIAGNOSIS — I10 Essential (primary) hypertension: Secondary | ICD-10-CM | POA: Diagnosis not present

## 2017-06-30 DIAGNOSIS — G8929 Other chronic pain: Secondary | ICD-10-CM | POA: Diagnosis not present

## 2017-06-30 DIAGNOSIS — T84621D Infection and inflammatory reaction due to internal fixation device of left femur, subsequent encounter: Secondary | ICD-10-CM | POA: Diagnosis not present

## 2017-07-04 DIAGNOSIS — G4733 Obstructive sleep apnea (adult) (pediatric): Secondary | ICD-10-CM | POA: Diagnosis not present

## 2017-07-04 DIAGNOSIS — I10 Essential (primary) hypertension: Secondary | ICD-10-CM | POA: Diagnosis not present

## 2017-07-04 DIAGNOSIS — T84621D Infection and inflammatory reaction due to internal fixation device of left femur, subsequent encounter: Secondary | ICD-10-CM | POA: Diagnosis not present

## 2017-07-04 DIAGNOSIS — D649 Anemia, unspecified: Secondary | ICD-10-CM | POA: Diagnosis not present

## 2017-07-04 DIAGNOSIS — M545 Low back pain: Secondary | ICD-10-CM | POA: Diagnosis not present

## 2017-07-04 DIAGNOSIS — G8929 Other chronic pain: Secondary | ICD-10-CM | POA: Diagnosis not present

## 2017-07-05 ENCOUNTER — Telehealth (INDEPENDENT_AMBULATORY_CARE_PROVIDER_SITE_OTHER): Payer: Self-pay | Admitting: Orthopedic Surgery

## 2017-07-05 DIAGNOSIS — T84621D Infection and inflammatory reaction due to internal fixation device of left femur, subsequent encounter: Secondary | ICD-10-CM | POA: Diagnosis not present

## 2017-07-05 DIAGNOSIS — D649 Anemia, unspecified: Secondary | ICD-10-CM | POA: Diagnosis not present

## 2017-07-05 DIAGNOSIS — G8929 Other chronic pain: Secondary | ICD-10-CM | POA: Diagnosis not present

## 2017-07-05 DIAGNOSIS — M545 Low back pain: Secondary | ICD-10-CM | POA: Diagnosis not present

## 2017-07-05 DIAGNOSIS — G4733 Obstructive sleep apnea (adult) (pediatric): Secondary | ICD-10-CM | POA: Diagnosis not present

## 2017-07-05 DIAGNOSIS — I10 Essential (primary) hypertension: Secondary | ICD-10-CM | POA: Diagnosis not present

## 2017-07-05 NOTE — Telephone Encounter (Signed)
Patient called asked if she was approved to have an injection, Patient said she talked to Dr Sharol Given about the injection the last time she was in the office. The number to contact patient is [phone]

## 2017-07-05 NOTE — Telephone Encounter (Signed)
Per Dr Jess Barters last OV note ok to proceed with gel injection. Can we get her pre-approved for gel injection?

## 2017-07-06 NOTE — Telephone Encounter (Signed)
M17.11 monovisc injection for right knee.

## 2017-07-07 DIAGNOSIS — G4733 Obstructive sleep apnea (adult) (pediatric): Secondary | ICD-10-CM | POA: Diagnosis not present

## 2017-07-07 DIAGNOSIS — D649 Anemia, unspecified: Secondary | ICD-10-CM | POA: Diagnosis not present

## 2017-07-07 DIAGNOSIS — G8929 Other chronic pain: Secondary | ICD-10-CM | POA: Diagnosis not present

## 2017-07-07 DIAGNOSIS — T84621D Infection and inflammatory reaction due to internal fixation device of left femur, subsequent encounter: Secondary | ICD-10-CM | POA: Diagnosis not present

## 2017-07-07 DIAGNOSIS — I10 Essential (primary) hypertension: Secondary | ICD-10-CM | POA: Diagnosis not present

## 2017-07-07 DIAGNOSIS — M545 Low back pain: Secondary | ICD-10-CM | POA: Diagnosis not present

## 2017-07-08 DIAGNOSIS — G4733 Obstructive sleep apnea (adult) (pediatric): Secondary | ICD-10-CM | POA: Diagnosis not present

## 2017-07-08 DIAGNOSIS — I10 Essential (primary) hypertension: Secondary | ICD-10-CM | POA: Diagnosis not present

## 2017-07-08 DIAGNOSIS — D649 Anemia, unspecified: Secondary | ICD-10-CM | POA: Diagnosis not present

## 2017-07-08 DIAGNOSIS — T84621D Infection and inflammatory reaction due to internal fixation device of left femur, subsequent encounter: Secondary | ICD-10-CM | POA: Diagnosis not present

## 2017-07-08 DIAGNOSIS — M545 Low back pain: Secondary | ICD-10-CM | POA: Diagnosis not present

## 2017-07-08 DIAGNOSIS — G8929 Other chronic pain: Secondary | ICD-10-CM | POA: Diagnosis not present

## 2017-07-11 DIAGNOSIS — T84621D Infection and inflammatory reaction due to internal fixation device of left femur, subsequent encounter: Secondary | ICD-10-CM | POA: Diagnosis not present

## 2017-07-11 DIAGNOSIS — G4733 Obstructive sleep apnea (adult) (pediatric): Secondary | ICD-10-CM | POA: Diagnosis not present

## 2017-07-11 DIAGNOSIS — G8929 Other chronic pain: Secondary | ICD-10-CM | POA: Diagnosis not present

## 2017-07-11 DIAGNOSIS — M545 Low back pain: Secondary | ICD-10-CM | POA: Diagnosis not present

## 2017-07-11 DIAGNOSIS — D649 Anemia, unspecified: Secondary | ICD-10-CM | POA: Diagnosis not present

## 2017-07-11 DIAGNOSIS — I10 Essential (primary) hypertension: Secondary | ICD-10-CM | POA: Diagnosis not present

## 2017-07-13 ENCOUNTER — Telehealth (INDEPENDENT_AMBULATORY_CARE_PROVIDER_SITE_OTHER): Payer: Self-pay

## 2017-07-13 DIAGNOSIS — G4733 Obstructive sleep apnea (adult) (pediatric): Secondary | ICD-10-CM | POA: Diagnosis not present

## 2017-07-13 DIAGNOSIS — T84621D Infection and inflammatory reaction due to internal fixation device of left femur, subsequent encounter: Secondary | ICD-10-CM | POA: Diagnosis not present

## 2017-07-13 DIAGNOSIS — M545 Low back pain: Secondary | ICD-10-CM | POA: Diagnosis not present

## 2017-07-13 DIAGNOSIS — G8929 Other chronic pain: Secondary | ICD-10-CM | POA: Diagnosis not present

## 2017-07-13 DIAGNOSIS — I10 Essential (primary) hypertension: Secondary | ICD-10-CM | POA: Diagnosis not present

## 2017-07-13 DIAGNOSIS — D649 Anemia, unspecified: Secondary | ICD-10-CM | POA: Diagnosis not present

## 2017-07-13 NOTE — Telephone Encounter (Signed)
Noted  

## 2017-07-13 NOTE — Telephone Encounter (Signed)
Submitted application online for Monovisc, right knee.  Patient approved for Monovisc injection, right knee. No PA required.

## 2017-07-18 ENCOUNTER — Encounter (INDEPENDENT_AMBULATORY_CARE_PROVIDER_SITE_OTHER): Payer: Self-pay | Admitting: Orthopedic Surgery

## 2017-07-18 ENCOUNTER — Ambulatory Visit (INDEPENDENT_AMBULATORY_CARE_PROVIDER_SITE_OTHER): Payer: Medicare Other | Admitting: Orthopedic Surgery

## 2017-07-18 VITALS — Ht <= 58 in | Wt 128.0 lb

## 2017-07-18 DIAGNOSIS — L97919 Non-pressure chronic ulcer of unspecified part of right lower leg with unspecified severity: Secondary | ICD-10-CM

## 2017-07-18 DIAGNOSIS — I87331 Chronic venous hypertension (idiopathic) with ulcer and inflammation of right lower extremity: Secondary | ICD-10-CM | POA: Diagnosis not present

## 2017-07-18 DIAGNOSIS — M1711 Unilateral primary osteoarthritis, right knee: Secondary | ICD-10-CM | POA: Diagnosis not present

## 2017-07-18 MED ORDER — HYALURONAN 88 MG/4ML IX SOSY
88.0000 mg | PREFILLED_SYRINGE | INTRA_ARTICULAR | Status: AC | PRN
  Administered 2017-07-18: 88 mg via INTRA_ARTICULAR

## 2017-07-18 MED ORDER — METHYLPREDNISOLONE ACETATE 40 MG/ML IJ SUSP
40.0000 mg | INTRAMUSCULAR | Status: AC | PRN
  Administered 2017-07-18: 40 mg via INTRA_ARTICULAR

## 2017-07-18 MED ORDER — LIDOCAINE HCL 1 % IJ SOLN
1.0000 mL | INTRAMUSCULAR | Status: AC | PRN
  Administered 2017-07-18: 1 mL
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
## 2017-07-19 ENCOUNTER — Encounter: Payer: Self-pay | Admitting: Cardiovascular Disease

## 2017-07-19 ENCOUNTER — Ambulatory Visit (INDEPENDENT_AMBULATORY_CARE_PROVIDER_SITE_OTHER): Payer: Medicare Other | Admitting: Cardiovascular Disease

## 2017-07-19 VITALS — BP 142/58 | HR 69 | Wt 114.0 lb

## 2017-07-19 DIAGNOSIS — I351 Nonrheumatic aortic (valve) insufficiency: Secondary | ICD-10-CM | POA: Diagnosis not present

## 2017-07-19 DIAGNOSIS — E785 Hyperlipidemia, unspecified: Secondary | ICD-10-CM

## 2017-07-19 DIAGNOSIS — I10 Essential (primary) hypertension: Secondary | ICD-10-CM

## 2017-07-19 NOTE — Assessment & Plan Note (Signed)
She of moderate aortic insufficiency 2D echo performed 05/24/2016 LV size and function with moderate AI unchanged from prior studies.  She is asymptomatic.  We will recheck a 2D echocardiogram.

## 2017-07-19 NOTE — Assessment & Plan Note (Signed)
History of hyperlipidemia on statin therapy. 

## 2017-07-19 NOTE — Assessment & Plan Note (Signed)
History of essential hypertension her blood pressure measured today at 142/58 is on carvedilol and losartan.  Continue current meds at current dosing.

## 2017-07-19 NOTE — Patient Instructions (Signed)
Medication Instructions:   NO CHANGE  Testing/Procedures:  Your physician has requested that you have an echocardiogram. Echocardiography is a painless test that uses sound waves to create images of your heart. It provides your doctor with information about the size and shape of your heart and how well your heart's chambers and valves are working. This procedure takes approximately one hour. There are no restrictions for this procedure.    Follow-Up:  Your physician wants you to follow-up in: ONE YEAR WITH DR BERRY You will receive a reminder letter in the mail two months in advance. If you don't receive a letter, please call our office to schedule the follow-up appointment.   If you need a refill on your cardiac medications before your next appointment, please call your pharmacy.    

## 2017-07-19 NOTE — Progress Notes (Signed)
07/19/2017 Julia Mcconnell   [birth year]  [id number]  Primary Physician Deland Pretty, MD Primary Cardiologist: Lorretta Harp MD FACP, [place], [place], [place]  HPI:  Julia Mcconnell is a 82 y.o.  thin-appearing single Caucasian female with no children whom I last saw in the office  07/13/2016.   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  Her problems include hypertension, hyperlipidemia, and moderate to severe aortic insufficiency which she is asymptomatic from. LV size and function have remained normal by 2D echo. Dr. Shelia Media follows her laboratory exams including lipid profile.   Since I saw her last she has been without complaints except for an episode of what sounds like a TIA, which she was worked up for and which resolved spontaneously. Carotid Dopplers performed in our office December 03, 2011, were essentially normal. Since I saw her back 12 months ago she's remained currently stable. A 2-D echocardiogram performed 05/24/16 revealed normal LV size and function with moderate aortic insufficiency unchanged from prior echoes. She remains asymptomatic. She underwent uncomplicated right total knee replacement by Dr. Sharol Given 06/03/16. She did have a Myoview stress test performed 06/02/15 which was low risk.  She unfortunately fractured her femur femur since I saw her last and is recuperating from multiple surgeries.  She currently is in a wheelchair. Her last 2D echo performed 05/24/2016 showed normal LV systolic function, grade 1 diastolic dysfunction and moderate AI unchanged from her prior echo.  She remains asymptomatic.     Current Meds  Medication Sig  . acetaminophen (TYLENOL) 500 MG tablet Take 1 tablet (500 mg total) by mouth every 6 (six) hours as needed for mild pain.  Marland Kitchen aspirin EC 81 MG tablet Take 1 tablet (81 mg total) by mouth daily.  Marland Kitchen atorvastatin (LIPITOR) 20 MG tablet Take 20 mg by mouth daily.  . calcium carbonate (OS-CAL) 600  MG TABS Take 600 mg by mouth 2 (two) times daily.   . carvedilol (COREG) 12.5 MG tablet TAKE 1/2 (ONE-HALF) TABLET BY MOUTH TWICE DAILY WITH  MEALS  . carvedilol (COREG) 3.125 MG tablet Take 1 tablet (3.125 mg total) by mouth 2 (two) times daily with a meal.  . clopidogrel (PLAVIX) 75 MG tablet Take 1 tablet (75 mg total) by mouth daily.  Marland Kitchen HYDROcodone-acetaminophen (NORCO/VICODIN) 5-325 MG tablet Take 1 tablet by mouth every 4 (four) hours as needed for moderate pain.  Marland Kitchen losartan (COZAAR) 50 MG tablet Take 1 tablet (50 mg total) by mouth daily. Hold until follow-up with your doctor or BP normal  . Multiple Vitamin (MULTIVITAMIN) tablet Take 1 tablet by mouth daily.    . phenol (CHLORASEPTIC) 1.4 % LIQD Use as directed 1 spray in the mouth or throat as needed for throat irritation / pain.  . polyethylene glycol (MIRALAX / GLYCOLAX) packet Take 17 g by mouth daily as needed for moderate constipation.  . senna-docusate (SENOKOT-S) 8.6-50 MG tablet Take 1 tablet by mouth 2 (two) times daily.  Marland Kitchen sulfamethoxazole-trimethoprim (BACTRIM DS,SEPTRA DS) 800-160 MG tablet Take 1 tablet by mouth 2 (two) times daily.  . traMADol (ULTRAM) 50 MG tablet Take 50 mg by mouth every 6 (six) hours as needed.     Allergies  Allergen Reactions  . Codeine Other (See Comments)    Altered mental status    Social History   Socioeconomic History  . Marital status: Single    Spouse name: Not on file  .  Number of children: o  . Years of education: 59  . Highest education level: Not on file  Occupational History    Employer: [employer]  . Occupation: clerk  Social Needs  . Financial resource strain: Not on file  . Food insecurity:    Worry: Not on file    Inability: Not on file  . Transportation needs:    Medical: Not on file    Non-medical: Not on file  Tobacco Use  . Smoking status: Never Smoker  . Smokeless tobacco: Never Used  Substance and Sexual Activity  . Alcohol use: No  . Drug use: No  .  Sexual activity: Never  Lifestyle  . Physical activity:    Days per week: Not on file    Minutes per session: Not on file  . Stress: Not on file  Relationships  . Social connections:    Talks on phone: Not on file    Gets together: Not on file    Attends religious service: Not on file    Active member of club or organization: Not on file    Attends meetings of clubs or organizations: Not on file    Relationship status: Not on file  . Intimate partner violence:    Fear of current or ex partner: Not on file    Emotionally abused: Not on file    Physically abused: Not on file    Forced sexual activity: Not on file  Other Topics Concern  . 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     Review of Systems: General: negative for chills, fever, night sweats or weight changes.  Cardiovascular: negative for chest pain, dyspnea on exertion, edema, orthopnea, palpitations, paroxysmal nocturnal dyspnea or shortness of breath Dermatological: negative for rash Respiratory: negative for cough or wheezing Urologic: negative for hematuria Abdominal: negative for nausea, vomiting, diarrhea, bright red blood per rectum, melena, or hematemesis Neurologic: negative for visual changes, syncope, or dizziness All other systems reviewed and are otherwise negative except as noted above.    Blood pressure (!) 142/58, pulse 69, weight 114 lb (51.7 kg).  General appearance: alert and no distress Neck: no adenopathy, no carotid bruit, no JVD, supple, symmetrical, trachea midline and thyroid not enlarged, symmetric, no tenderness/mass/nodules Lungs: clear to auscultation bilaterally Heart: regular rate and rhythm, S1, S2 normal, no murmur, click, rub or gallop Extremities: extremities normal, atraumatic, no cyanosis or edema Pulses: 2+ and symmetric Skin: Skin color, texture, turgor normal. No rashes or lesions Neurologic: Alert and oriented X 3, normal  strength and tone. Normal symmetric reflexes. Normal coordination and gait  EKG not performed today  ASSESSMENT AND PLAN:   Hypertension History of essential hypertension her blood pressure measured today at 142/58 is on carvedilol and losartan.  Continue current meds at current dosing.  Dyslipidemia History of hyperlipidemia on statin therapy  Moderate aortic regurgitation She of moderate aortic insufficiency 2D echo performed 05/24/2016 LV size and function with moderate AI unchanged from prior studies.  She is asymptomatic.  We will recheck a 2D echocardiogram.      Lorretta Harp MD [facility], [facility] 07/19/2017 2:01 PM

## 2017-07-21 DIAGNOSIS — I10 Essential (primary) hypertension: Secondary | ICD-10-CM | POA: Diagnosis not present

## 2017-07-21 DIAGNOSIS — F329 Major depressive disorder, single episode, unspecified: Secondary | ICD-10-CM | POA: Diagnosis not present

## 2017-07-22 ENCOUNTER — Telehealth (INDEPENDENT_AMBULATORY_CARE_PROVIDER_SITE_OTHER): Payer: Self-pay | Admitting: Orthopedic Surgery

## 2017-07-22 NOTE — Telephone Encounter (Signed)
Right Knee injection   Pt called wanted to inform Dr.Duda that injection didn't work

## 2017-07-22 NOTE — Telephone Encounter (Signed)
I called and sw pt to advise that she received and injection 4 days ago and that she should give it a little more time. To call with update next week. We can move her appt up sooner if need be from 08/15/17.

## 2017-07-29 ENCOUNTER — Telehealth (INDEPENDENT_AMBULATORY_CARE_PROVIDER_SITE_OTHER): Payer: Self-pay | Admitting: Orthopedic Surgery

## 2017-07-29 DIAGNOSIS — I10 Essential (primary) hypertension: Secondary | ICD-10-CM | POA: Diagnosis not present

## 2017-07-29 DIAGNOSIS — E559 Vitamin D deficiency, unspecified: Secondary | ICD-10-CM | POA: Diagnosis not present

## 2017-07-29 DIAGNOSIS — E7800 Pure hypercholesterolemia, unspecified: Secondary | ICD-10-CM | POA: Diagnosis not present

## 2017-07-29 DIAGNOSIS — M8580 Other specified disorders of bone density and structure, unspecified site: Secondary | ICD-10-CM | POA: Diagnosis not present

## 2017-07-29 NOTE — Telephone Encounter (Signed)
Pt called left VM not doing well

## 2017-07-29 NOTE — Telephone Encounter (Signed)
IC s/w patient. She states she is not doing well. Knee is really hurting and injection did not help her at all. Wants to know what next step is. Please advise. Thanks.

## 2017-07-31 NOTE — Telephone Encounter (Signed)
Schedule patient for follow-up appointment to discuss surgical options.

## 2017-08-01 NOTE — Telephone Encounter (Signed)
Can you please call patient per Dr Sharol Given and schedule ROV to discuss her pain that is getting worse in her knee. Thanks.

## 2017-08-03 DIAGNOSIS — I872 Venous insufficiency (chronic) (peripheral): Secondary | ICD-10-CM | POA: Diagnosis not present

## 2017-08-03 DIAGNOSIS — Z0000 Encounter for general adult medical examination without abnormal findings: Secondary | ICD-10-CM | POA: Diagnosis not present

## 2017-08-03 DIAGNOSIS — I351 Nonrheumatic aortic (valve) insufficiency: Secondary | ICD-10-CM | POA: Diagnosis not present

## 2017-08-03 DIAGNOSIS — M810 Age-related osteoporosis without current pathological fracture: Secondary | ICD-10-CM | POA: Diagnosis not present

## 2017-08-03 DIAGNOSIS — I10 Essential (primary) hypertension: Secondary | ICD-10-CM | POA: Diagnosis not present

## 2017-08-03 DIAGNOSIS — F329 Major depressive disorder, single episode, unspecified: Secondary | ICD-10-CM | POA: Diagnosis not present

## 2017-08-03 DIAGNOSIS — I6529 Occlusion and stenosis of unspecified carotid artery: Secondary | ICD-10-CM | POA: Diagnosis not present

## 2017-08-03 DIAGNOSIS — E7800 Pure hypercholesterolemia, unspecified: Secondary | ICD-10-CM | POA: Diagnosis not present

## 2017-08-03 DIAGNOSIS — D72819 Decreased white blood cell count, unspecified: Secondary | ICD-10-CM | POA: Diagnosis not present

## 2017-08-03 DIAGNOSIS — M1990 Unspecified osteoarthritis, unspecified site: Secondary | ICD-10-CM | POA: Diagnosis not present

## 2017-08-03 DIAGNOSIS — G4733 Obstructive sleep apnea (adult) (pediatric): Secondary | ICD-10-CM | POA: Diagnosis not present

## 2017-08-09 ENCOUNTER — Encounter (INDEPENDENT_AMBULATORY_CARE_PROVIDER_SITE_OTHER): Payer: Self-pay | Admitting: Orthopedic Surgery

## 2017-08-09 ENCOUNTER — Ambulatory Visit (INDEPENDENT_AMBULATORY_CARE_PROVIDER_SITE_OTHER): Payer: Medicare Other | Admitting: Orthopedic Surgery

## 2017-08-09 DIAGNOSIS — M1711 Unilateral primary osteoarthritis, right knee: Secondary | ICD-10-CM | POA: Diagnosis not present

## 2017-08-09 MED ORDER — METHYLPREDNISOLONE ACETATE 40 MG/ML IJ SUSP
40.0000 mg | INTRAMUSCULAR | Status: AC | PRN
  Administered 2017-08-09: 40 mg via INTRA_ARTICULAR

## 2017-08-09 MED ORDER — LIDOCAINE HCL 1 % IJ SOLN
5.0000 mL | INTRAMUSCULAR | Status: AC | PRN
  Administered 2017-08-09: 5 mL
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
## 2017-08-10 DIAGNOSIS — L309 Dermatitis, unspecified: Secondary | ICD-10-CM | POA: Diagnosis not present

## 2017-08-10 DIAGNOSIS — I10 Essential (primary) hypertension: Secondary | ICD-10-CM | POA: Diagnosis not present

## 2017-08-15 ENCOUNTER — Ambulatory Visit (INDEPENDENT_AMBULATORY_CARE_PROVIDER_SITE_OTHER): Payer: Medicare Other | Admitting: Orthopedic Surgery

## 2017-08-16 ENCOUNTER — Telehealth (INDEPENDENT_AMBULATORY_CARE_PROVIDER_SITE_OTHER): Payer: Self-pay | Admitting: Orthopedic Surgery

## 2017-08-16 NOTE — Telephone Encounter (Signed)
Patient called stating that she would like to discuss with you a time to set up her surgery to get her knee replaced.  CB#[phone].  Thank you.

## 2017-08-16 NOTE — Telephone Encounter (Signed)
Call patient and set up appointment for total knee arthroplasty.  See if she wants to pursue discharge to rehab facility.

## 2017-08-16 NOTE — Telephone Encounter (Signed)
This is for you!

## 2017-08-16 NOTE — Telephone Encounter (Signed)
I do not have a surgery sheet for Ms. Julia Mcconnell, is it ok to schedule?

## 2017-08-19 NOTE — Telephone Encounter (Signed)
Please call Ms. Whetstone to discuss. Thank you

## 2017-08-22 ENCOUNTER — Ambulatory Visit (HOSPITAL_COMMUNITY): Payer: Medicare Other

## 2017-08-22 ENCOUNTER — Telehealth (INDEPENDENT_AMBULATORY_CARE_PROVIDER_SITE_OTHER): Payer: Self-pay | Admitting: Orthopedic Surgery

## 2017-08-22 NOTE — Telephone Encounter (Signed)
Patient called asked for a call back.  Patient advised she would like to discuss surgery. The number to contact patient is [phone]

## 2017-08-23 NOTE — Telephone Encounter (Signed)
Tried to call pt no answer will hold and try again.

## 2017-08-24 NOTE — Telephone Encounter (Signed)
I called pt and she states that she would like to schedule a knee replacement. She would like to set this up in September. Do you want to fill out a blue sheet for her?

## 2017-08-25 ENCOUNTER — Other Ambulatory Visit: Payer: Self-pay

## 2017-08-25 ENCOUNTER — Ambulatory Visit (HOSPITAL_COMMUNITY): Payer: Medicare Other | Attending: Cardiology

## 2017-08-25 DIAGNOSIS — I10 Essential (primary) hypertension: Secondary | ICD-10-CM | POA: Diagnosis not present

## 2017-08-25 DIAGNOSIS — I351 Nonrheumatic aortic (valve) insufficiency: Secondary | ICD-10-CM

## 2017-08-25 DIAGNOSIS — E785 Hyperlipidemia, unspecified: Secondary | ICD-10-CM | POA: Diagnosis not present

## 2017-08-25 NOTE — Telephone Encounter (Signed)
Surgical sheet completed for right total knee arthroplasty.

## 2017-08-25 NOTE — Telephone Encounter (Signed)
For you -

## 2017-09-06 DIAGNOSIS — I10 Essential (primary) hypertension: Secondary | ICD-10-CM | POA: Diagnosis not present

## 2017-09-08 ENCOUNTER — Ambulatory Visit (INDEPENDENT_AMBULATORY_CARE_PROVIDER_SITE_OTHER): Payer: Self-pay | Admitting: Physician Assistant

## 2017-09-13 DIAGNOSIS — I10 Essential (primary) hypertension: Secondary | ICD-10-CM | POA: Diagnosis not present

## 2017-09-16 DIAGNOSIS — I10 Essential (primary) hypertension: Secondary | ICD-10-CM | POA: Diagnosis not present

## 2017-09-16 DIAGNOSIS — Z23 Encounter for immunization: Secondary | ICD-10-CM | POA: Diagnosis not present

## 2017-09-16 DIAGNOSIS — E871 Hypo-osmolality and hyponatremia: Secondary | ICD-10-CM | POA: Diagnosis not present

## 2017-09-16 NOTE — Pre-Procedure Instructions (Signed)
TIARA MAULTSBY  09/16/2017      [place], [place] - [address] [place] [address] Phone: [phone] Fax: [phone]  [id number] - [place], [place] [address]  [place] [address] Phone: [phone] Fax: [phone]    Your procedure is scheduled on Wednesday Septmeber 18th.  Report to [facility] at 0800 A.M.  Call this number if you have problems the morning of surgery:  [phone]   Remember:  Do not eat or drink after midnight.    Take these medicines the morning of surgery with A SIP OF WATER   Tylenol (if needed)  Norvasc  Atorvastatin  Coreg   Follow Surgeons instructions for when to stop taking Plavix and Aspirin.  7 days prior to surgery STOP taking any Aspirin(unless otherwise instructed by your surgeon), Aleve, Naproxen, Ibuprofen, Motrin, Advil, Goody's, BC's, all herbal medications, fish oil, and all vitamins     Do not wear jewelry, make-up or nail polish.  Do not wear lotions, powders, or perfumes, or deodorant.  Do not shave 48 hours prior to surgery.  Men may shave face and neck.  Do not bring valuables to the hospital.  [facility] is not responsible for any belongings or valuables.  Contacts, dentures or bridgework may not be worn into surgery.  Leave your suitcase in the car.  After surgery it may be brought to your room.  For patients admitted to the hospital, discharge time will be determined by your treatment team.  Patients discharged the day of surgery will not be allowed to drive home.    [facility]- Preparing For Surgery  Before surgery, you can play an important role. Because skin is not sterile, your skin needs to be as free of germs as possible. You can reduce the number of germs on your skin by washing with CHG (chlorahexidine gluconate) Soap before surgery.  CHG is  an antiseptic cleaner which kills germs and bonds with the skin to continue killing germs even after washing.    Oral Hygiene is also important to reduce your risk of infection.  Remember - BRUSH YOUR TEETH THE MORNING OF SURGERY WITH YOUR REGULAR TOOTHPASTE  Please do not use if you have an allergy to CHG or antibacterial soaps. If your skin becomes reddened/irritated stop using the CHG.  Do not shave (including legs and underarms) for at least 48 hours prior to first CHG shower. It is OK to shave your face.  Please follow these instructions carefully.   1. Shower the NIGHT BEFORE SURGERY and the MORNING OF SURGERY with CHG.   2. If you chose to wash your hair, wash your hair first as usual with your normal shampoo.  3. After you shampoo, rinse your hair and body thoroughly to remove the shampoo.  4. Use CHG as you would any other liquid soap. You can apply CHG directly to the skin and wash gently with a scrungie or a clean washcloth.   5. Apply the CHG Soap to your body ONLY FROM THE NECK DOWN.  Do not use on open wounds or open sores. Avoid contact with your eyes, ears, mouth and genitals (private parts). Wash Face and genitals (private parts)  with your normal soap.  6. Wash thoroughly, paying special attention to the area where your surgery will be performed.  7. Thoroughly rinse your body with warm  water from the neck down.  8. DO NOT shower/wash with your normal soap after using and rinsing off the CHG Soap.  9. Pat yourself dry with a CLEAN TOWEL.  10. Wear CLEAN PAJAMAS to bed the night before surgery, wear comfortable clothes the morning of surgery  11. Place CLEAN SHEETS on your bed the night of your first shower and DO NOT SLEEP WITH PETS.    Day of Surgery:  Do not apply any deodorants/lotions.  Please wear clean clothes to the hospital/surgery center.   Remember to brush your teeth WITH YOUR REGULAR TOOTHPASTE.    Please read over the following fact sheets that  you were given. Coughing and Deep Breathing, Total Joint Packet, MRSA Information and Surgical Site Infection Prevention

## 2017-09-19 ENCOUNTER — Encounter (HOSPITAL_COMMUNITY)
Admission: RE | Admit: 2017-09-19 | Discharge: 2017-09-19 | Disposition: A | Discharge status: Home / Self Care | Payer: Medicare Other | Source: Ambulatory Visit | Attending: Orthopedic Surgery | Admitting: Orthopedic Surgery

## 2017-09-19 ENCOUNTER — Telehealth (INDEPENDENT_AMBULATORY_CARE_PROVIDER_SITE_OTHER): Payer: Self-pay | Admitting: Orthopedic Surgery

## 2017-09-19 ENCOUNTER — Encounter (HOSPITAL_COMMUNITY): Payer: Self-pay

## 2017-09-19 DIAGNOSIS — Z01812 Encounter for preprocedural laboratory examination: Secondary | ICD-10-CM | POA: Insufficient documentation

## 2017-09-19 HISTORY — DX: Major depressive disorder, single episode, unspecified: F32.9

## 2017-09-19 HISTORY — DX: Depression, unspecified: F32.A

## 2017-09-19 LAB — BASIC METABOLIC PANEL
ANION GAP: 10 (ref 5–15)
BUN: 16 mg/dL (ref 8–23)
CALCIUM: 9.4 mg/dL (ref 8.9–10.3)
CO2: 30 mmol/L (ref 22–32)
Chloride: 92 mmol/L — ABNORMAL LOW (ref 98–111)
Creatinine, Ser: 0.67 mg/dL (ref 0.44–1.00)
GFR calc non Af Amer: >60 mL/min (ref >60)
Glucose, Bld: 90 mg/dL (ref 70–99)
Potassium: 4 mmol/L (ref 3.5–5.1)
Sodium: 132 mmol/L — ABNORMAL LOW (ref 135–145)

## 2017-09-19 LAB — SURGICAL PCR SCREEN
MRSA, PCR: POSITIVE — AB
STAPHYLOCOCCUS AUREUS: POSITIVE — AB

## 2017-09-19 LAB — CBC
HCT: 37.8 % (ref 36.0–46.0)
HEMOGLOBIN: 12.4 g/dL (ref 12.0–15.0)
MCH: 30.9 pg (ref 26.0–34.0)
MCHC: 32.8 g/dL (ref 30.0–36.0)
MCV: 94.3 fL (ref 78.0–100.0)
Platelets: 202 10*3/uL (ref 150–400)
RBC: 4.01 MIL/uL (ref 3.87–5.11)
RDW: 15.6 % — ABNORMAL HIGH (ref 11.5–15.5)
WBC: 4.6 10*3/uL (ref 4.0–10.5)

## 2017-09-19 NOTE — Telephone Encounter (Signed)
Pt is going to be sch for a total knee replacement and wants to know if she needs to stop her Plavix.

## 2017-09-19 NOTE — Progress Notes (Addendum)
Pt. Called results of PCR  Nasal swab. Positive for for Staph and MRSA.   Prescription for Mupirocin called to [place] at [facility].  IB  Message sent to Dr. Sharol Given

## 2017-09-19 NOTE — Progress Notes (Signed)
Cardiologist Quay Burow  Last OV  07-19-2017   Pt. To call Dr. Jess Barters office for instructions on when to stop Aspirin and Plavix.  PCP  Dr. Deland Pretty

## 2017-09-19 NOTE — Telephone Encounter (Signed)
Patient needs a call back to discuss when to stop her Plavix prior to surgery.  Please call patient [phone]

## 2017-09-20 ENCOUNTER — Telehealth (INDEPENDENT_AMBULATORY_CARE_PROVIDER_SITE_OTHER): Payer: Self-pay | Admitting: Orthopedic Surgery

## 2017-09-20 NOTE — Telephone Encounter (Signed)
Patient calling again wanting to know if and when she is to stop her Plavix.  She is scheduled for knee surgery this Wednesday Sept 18th.  Also she would like to go to [facility] for her post operative care.

## 2017-09-20 NOTE — Telephone Encounter (Signed)
uc- stop plavix 5 days before surgery

## 2017-09-20 NOTE — Telephone Encounter (Signed)
Please advise on the Plavix

## 2017-09-21 NOTE — Telephone Encounter (Signed)
I called and advised Julia Mcconnell of Dr. Jess Barters instructions to stop Plavix 5 days before surgery.

## 2017-09-22 ENCOUNTER — Other Ambulatory Visit (INDEPENDENT_AMBULATORY_CARE_PROVIDER_SITE_OTHER): Payer: Self-pay

## 2017-09-22 MED ORDER — MUPIROCIN CALCIUM 2 % NA OINT: 1.0000 "application " | TOPICAL_OINTMENT | Freq: Two times a day (BID) | NASAL | 0 refills | Status: DC

## 2017-09-28 ENCOUNTER — Encounter (HOSPITAL_COMMUNITY): Payer: Self-pay

## 2017-09-28 ENCOUNTER — Inpatient Hospital Stay (HOSPITAL_COMMUNITY)
Admission: RE | Admit: 2017-09-28 | Discharge: 2017-10-01 | DRG: 470 | Disposition: A | Discharge status: Skilled Nursing Facility | Payer: Medicare Other | Attending: Orthopedic Surgery | Admitting: Orthopedic Surgery

## 2017-09-28 ENCOUNTER — Encounter (HOSPITAL_COMMUNITY)
Admission: RE | Disposition: A | Discharge status: Skilled Nursing Facility | Payer: Self-pay | Source: Home / Self Care | Attending: Orthopedic Surgery

## 2017-09-28 ENCOUNTER — Other Ambulatory Visit: Payer: Self-pay

## 2017-09-28 ENCOUNTER — Inpatient Hospital Stay (HOSPITAL_COMMUNITY): Payer: Medicare Other | Admitting: Certified Registered"

## 2017-09-28 DIAGNOSIS — Z808 Family history of malignant neoplasm of other organs or systems: Secondary | ICD-10-CM

## 2017-09-28 DIAGNOSIS — I351 Nonrheumatic aortic (valve) insufficiency: Secondary | ICD-10-CM | POA: Diagnosis present

## 2017-09-28 DIAGNOSIS — Z8673 Personal history of transient ischemic attack (TIA), and cerebral infarction without residual deficits: Secondary | ICD-10-CM

## 2017-09-28 DIAGNOSIS — I119 Hypertensive heart disease without heart failure: Secondary | ICD-10-CM | POA: Diagnosis not present

## 2017-09-28 DIAGNOSIS — Z96652 Presence of left artificial knee joint: Secondary | ICD-10-CM | POA: Diagnosis present

## 2017-09-28 DIAGNOSIS — E785 Hyperlipidemia, unspecified: Secondary | ICD-10-CM | POA: Diagnosis present

## 2017-09-28 DIAGNOSIS — K219 Gastro-esophageal reflux disease without esophagitis: Secondary | ICD-10-CM | POA: Diagnosis not present

## 2017-09-28 DIAGNOSIS — Z803 Family history of malignant neoplasm of breast: Secondary | ICD-10-CM | POA: Diagnosis not present

## 2017-09-28 DIAGNOSIS — M1711 Unilateral primary osteoarthritis, right knee: Secondary | ICD-10-CM | POA: Diagnosis present

## 2017-09-28 DIAGNOSIS — M2550 Pain in unspecified joint: Secondary | ICD-10-CM | POA: Diagnosis not present

## 2017-09-28 DIAGNOSIS — Z9842 Cataract extraction status, left eye: Secondary | ICD-10-CM | POA: Diagnosis not present

## 2017-09-28 DIAGNOSIS — Z7401 Bed confinement status: Secondary | ICD-10-CM | POA: Diagnosis not present

## 2017-09-28 DIAGNOSIS — M810 Age-related osteoporosis without current pathological fracture: Secondary | ICD-10-CM | POA: Diagnosis present

## 2017-09-28 DIAGNOSIS — Z9841 Cataract extraction status, right eye: Secondary | ICD-10-CM | POA: Diagnosis not present

## 2017-09-28 DIAGNOSIS — Z471 Aftercare following joint replacement surgery: Secondary | ICD-10-CM | POA: Diagnosis not present

## 2017-09-28 DIAGNOSIS — M545 Low back pain: Secondary | ICD-10-CM | POA: Diagnosis present

## 2017-09-28 DIAGNOSIS — I10 Essential (primary) hypertension: Secondary | ICD-10-CM | POA: Diagnosis present

## 2017-09-28 DIAGNOSIS — G8918 Other acute postprocedural pain: Secondary | ICD-10-CM | POA: Diagnosis not present

## 2017-09-28 DIAGNOSIS — Z9989 Dependence on other enabling machines and devices: Secondary | ICD-10-CM | POA: Diagnosis not present

## 2017-09-28 DIAGNOSIS — G8929 Other chronic pain: Secondary | ICD-10-CM | POA: Diagnosis not present

## 2017-09-28 DIAGNOSIS — Z833 Family history of diabetes mellitus: Secondary | ICD-10-CM

## 2017-09-28 DIAGNOSIS — R4189 Other symptoms and signs involving cognitive functions and awareness: Secondary | ICD-10-CM | POA: Diagnosis not present

## 2017-09-28 DIAGNOSIS — G4733 Obstructive sleep apnea (adult) (pediatric): Secondary | ICD-10-CM | POA: Diagnosis present

## 2017-09-28 DIAGNOSIS — Z9089 Acquired absence of other organs: Secondary | ICD-10-CM

## 2017-09-28 DIAGNOSIS — M25761 Osteophyte, right knee: Secondary | ICD-10-CM | POA: Diagnosis present

## 2017-09-28 DIAGNOSIS — Z96651 Presence of right artificial knee joint: Secondary | ICD-10-CM

## 2017-09-28 DIAGNOSIS — M8788 Other osteonecrosis, other site: Secondary | ICD-10-CM | POA: Diagnosis present

## 2017-09-28 DIAGNOSIS — Z90710 Acquired absence of both cervix and uterus: Secondary | ICD-10-CM

## 2017-09-28 DIAGNOSIS — Z96659 Presence of unspecified artificial knee joint: Secondary | ICD-10-CM

## 2017-09-28 DIAGNOSIS — Z823 Family history of stroke: Secondary | ICD-10-CM

## 2017-09-28 DIAGNOSIS — Z79899 Other long term (current) drug therapy: Secondary | ICD-10-CM

## 2017-09-28 DIAGNOSIS — Z7902 Long term (current) use of antithrombotics/antiplatelets: Secondary | ICD-10-CM

## 2017-09-28 DIAGNOSIS — Z23 Encounter for immunization: Secondary | ICD-10-CM | POA: Diagnosis not present

## 2017-09-28 DIAGNOSIS — I872 Venous insufficiency (chronic) (peripheral): Secondary | ICD-10-CM | POA: Diagnosis present

## 2017-09-28 DIAGNOSIS — M6281 Muscle weakness (generalized): Secondary | ICD-10-CM | POA: Diagnosis not present

## 2017-09-28 DIAGNOSIS — Z7982 Long term (current) use of aspirin: Secondary | ICD-10-CM

## 2017-09-28 DIAGNOSIS — Z885 Allergy status to narcotic agent status: Secondary | ICD-10-CM

## 2017-09-28 DIAGNOSIS — R2689 Other abnormalities of gait and mobility: Secondary | ICD-10-CM | POA: Diagnosis not present

## 2017-09-28 DIAGNOSIS — E783 Hyperchylomicronemia: Secondary | ICD-10-CM | POA: Diagnosis not present

## 2017-09-28 HISTORY — PX: TOTAL KNEE ARTHROPLASTY: SHX125

## 2017-09-28 SURGERY — ARTHROPLASTY, KNEE, TOTAL
Anesthesia: Monitor Anesthesia Care | Laterality: Right

## 2017-09-28 MED ORDER — ATORVASTATIN CALCIUM 20 MG PO TABS
20.0000 mg | ORAL_TABLET | Freq: Every day | ORAL | Status: DC
  Administered 2017-09-29 – 2017-10-01 (×3): 20 mg via ORAL
  Filled 2017-09-28 (×3): qty 1

## 2017-09-28 MED ORDER — ONDANSETRON HCL 4 MG/2ML IJ SOLN: 4.0000 mg | Freq: Four times a day (QID) | INTRAMUSCULAR | Status: DC | PRN

## 2017-09-28 MED ORDER — DIPHENHYDRAMINE HCL 12.5 MG/5ML PO ELIX: 12.5000 mg | ORAL_SOLUTION | ORAL | Status: DC | PRN

## 2017-09-28 MED ORDER — GLYCOPYRROLATE 0.2 MG/ML IJ SOLN
INTRAMUSCULAR | Status: AC
  Filled 2017-09-28: qty 1

## 2017-09-28 MED ORDER — ONDANSETRON HCL 4 MG PO TABS: 4.0000 mg | ORAL_TABLET | Freq: Four times a day (QID) | ORAL | Status: DC | PRN

## 2017-09-28 MED ORDER — VANCOMYCIN HCL IN DEXTROSE 1-5 GM/200ML-% IV SOLN
1000.0000 mg | INTRAVENOUS | Status: AC
  Administered 2017-09-28: 1000 mg via INTRAVENOUS

## 2017-09-28 MED ORDER — OXYCODONE HCL 5 MG/5ML PO SOLN: 5.0000 mg | Freq: Once | ORAL | Status: DC | PRN

## 2017-09-28 MED ORDER — MUPIROCIN 2 % EX OINT
TOPICAL_OINTMENT | Freq: Two times a day (BID) | CUTANEOUS | Status: DC
  Administered 2017-09-28 – 2017-10-01 (×6): via NASAL
  Filled 2017-09-28: qty 22

## 2017-09-28 MED ORDER — PROPOFOL 500 MG/50ML IV EMUL
INTRAVENOUS | Status: DC | PRN
  Administered 2017-09-28: 50 ug/kg/min via INTRAVENOUS

## 2017-09-28 MED ORDER — BUPIVACAINE IN DEXTROSE 0.75-8.25 % IT SOLN
INTRATHECAL | Status: DC | PRN
  Administered 2017-09-28: 9 mg via INTRATHECAL

## 2017-09-28 MED ORDER — MAGNESIUM CITRATE PO SOLN: 1.0000 | Freq: Once | ORAL | Status: DC | PRN

## 2017-09-28 MED ORDER — SODIUM CHLORIDE 0.9 % IV SOLN
INTRAVENOUS | Status: DC | PRN
  Administered 2017-09-28: 60 ug/min via INTRAVENOUS

## 2017-09-28 MED ORDER — CEFAZOLIN SODIUM-DEXTROSE 2-4 GM/100ML-% IV SOLN
INTRAVENOUS | Status: AC
  Filled 2017-09-28: qty 100

## 2017-09-28 MED ORDER — PHENOL 1.4 % MT LIQD: 1.0000 | OROMUCOSAL | Status: DC | PRN

## 2017-09-28 MED ORDER — HYDROCODONE-ACETAMINOPHEN 7.5-325 MG PO TABS
1.0000 | ORAL_TABLET | ORAL | Status: DC | PRN
  Administered 2017-09-28 – 2017-09-29 (×4): 1 via ORAL
  Administered 2017-09-30: 2 via ORAL
  Filled 2017-09-28 (×4): qty 1

## 2017-09-28 MED ORDER — ACETAMINOPHEN 325 MG PO TABS: 325.0000 mg | ORAL_TABLET | ORAL | Status: DC | PRN

## 2017-09-28 MED ORDER — EPHEDRINE SULFATE-NACL 50-0.9 MG/10ML-% IV SOSY
PREFILLED_SYRINGE | INTRAVENOUS | Status: DC | PRN
  Administered 2017-09-28: 10 mg via INTRAVENOUS
  Administered 2017-09-28: 5 mg via INTRAVENOUS
  Administered 2017-09-28 (×2): 10 mg via INTRAVENOUS

## 2017-09-28 MED ORDER — EPHEDRINE 5 MG/ML INJ
INTRAVENOUS | Status: AC
  Filled 2017-09-28: qty 10

## 2017-09-28 MED ORDER — CEFAZOLIN SODIUM-DEXTROSE 2-4 GM/100ML-% IV SOLN
2.0000 g | INTRAVENOUS | Status: AC
  Administered 2017-09-28: 2 g via INTRAVENOUS

## 2017-09-28 MED ORDER — METHOCARBAMOL 500 MG PO TABS
500.0000 mg | ORAL_TABLET | Freq: Four times a day (QID) | ORAL | Status: DC | PRN
  Administered 2017-09-28: 500 mg via ORAL
  Filled 2017-09-28: qty 1

## 2017-09-28 MED ORDER — FENTANYL CITRATE (PF) 100 MCG/2ML IJ SOLN
INTRAMUSCULAR | Status: AC
  Administered 2017-09-28: 25 ug via INTRAVENOUS
  Filled 2017-09-28: qty 2

## 2017-09-28 MED ORDER — LIDOCAINE 2% (20 MG/ML) 5 ML SYRINGE
INTRAMUSCULAR | Status: AC
  Filled 2017-09-28: qty 5

## 2017-09-28 MED ORDER — PROPOFOL 10 MG/ML IV BOLUS
INTRAVENOUS | Status: AC
  Filled 2017-09-28: qty 20

## 2017-09-28 MED ORDER — MIDAZOLAM HCL 2 MG/2ML IJ SOLN
INTRAMUSCULAR | Status: AC
  Filled 2017-09-28: qty 2

## 2017-09-28 MED ORDER — FENTANYL CITRATE (PF) 100 MCG/2ML IJ SOLN
25.0000 ug | INTRAMUSCULAR | Status: DC | PRN
  Administered 2017-09-28: 25 ug via INTRAVENOUS

## 2017-09-28 MED ORDER — TRANEXAMIC ACID 1000 MG/10ML IV SOLN
2000.0000 mg | INTRAVENOUS | Status: AC
  Administered 2017-09-28: 2000 mg via TOPICAL
  Filled 2017-09-28: qty 20

## 2017-09-28 MED ORDER — FENTANYL CITRATE (PF) 250 MCG/5ML IJ SOLN
INTRAMUSCULAR | Status: DC | PRN
  Administered 2017-09-28: 25 ug via INTRAVENOUS

## 2017-09-28 MED ORDER — DOCUSATE SODIUM 100 MG PO CAPS
100.0000 mg | ORAL_CAPSULE | Freq: Two times a day (BID) | ORAL | Status: DC
  Administered 2017-09-29 – 2017-10-01 (×5): 100 mg via ORAL
  Filled 2017-09-28 (×6): qty 1

## 2017-09-28 MED ORDER — FENTANYL CITRATE (PF) 100 MCG/2ML IJ SOLN
INTRAMUSCULAR | Status: AC
  Administered 2017-09-28: 50 ug via INTRAVENOUS
  Filled 2017-09-28: qty 2

## 2017-09-28 MED ORDER — MORPHINE SULFATE (PF) 2 MG/ML IV SOLN
0.5000 mg | INTRAVENOUS | Status: DC | PRN
  Administered 2017-09-28: 1 mg via INTRAVENOUS
  Administered 2017-09-28: 0.5 mg via INTRAVENOUS
  Administered 2017-09-28: 1 mg via INTRAVENOUS
  Filled 2017-09-28 (×3): qty 1

## 2017-09-28 MED ORDER — METOCLOPRAMIDE HCL 5 MG/ML IJ SOLN: 5.0000 mg | Freq: Three times a day (TID) | INTRAMUSCULAR | Status: DC | PRN

## 2017-09-28 MED ORDER — PHENYLEPHRINE 40 MCG/ML (10ML) SYRINGE FOR IV PUSH (FOR BLOOD PRESSURE SUPPORT)
PREFILLED_SYRINGE | INTRAVENOUS | Status: AC
  Filled 2017-09-28: qty 10

## 2017-09-28 MED ORDER — HYDROCODONE-ACETAMINOPHEN 5-325 MG PO TABS
1.0000 | ORAL_TABLET | ORAL | Status: DC | PRN
  Administered 2017-09-29 – 2017-10-01 (×3): 2 via ORAL
  Filled 2017-09-28 (×4): qty 2

## 2017-09-28 MED ORDER — METOCLOPRAMIDE HCL 5 MG PO TABS: 5.0000 mg | ORAL_TABLET | Freq: Three times a day (TID) | ORAL | Status: DC | PRN

## 2017-09-28 MED ORDER — MEPERIDINE HCL 50 MG/ML IJ SOLN: 6.2500 mg | INTRAMUSCULAR | Status: DC | PRN

## 2017-09-28 MED ORDER — MENTHOL 3 MG MT LOZG: 1.0000 | LOZENGE | OROMUCOSAL | Status: DC | PRN

## 2017-09-28 MED ORDER — VANCOMYCIN HCL IN DEXTROSE 1-5 GM/200ML-% IV SOLN
INTRAVENOUS | Status: AC
  Administered 2017-09-28: 1000 mg via INTRAVENOUS
  Filled 2017-09-28: qty 200

## 2017-09-28 MED ORDER — LOSARTAN POTASSIUM 50 MG PO TABS
100.0000 mg | ORAL_TABLET | Freq: Every day | ORAL | Status: DC
  Administered 2017-09-29 – 2017-10-01 (×3): 100 mg via ORAL
  Filled 2017-09-28 (×3): qty 2

## 2017-09-28 MED ORDER — 0.9 % SODIUM CHLORIDE (POUR BTL) OPTIME
TOPICAL | Status: DC | PRN
  Administered 2017-09-28: 1000 mL

## 2017-09-28 MED ORDER — LACTATED RINGERS IV SOLN
INTRAVENOUS | Status: DC | PRN
  Administered 2017-09-28: 10:00:00 via INTRAVENOUS

## 2017-09-28 MED ORDER — ACETAMINOPHEN 325 MG PO TABS: 325.0000 mg | ORAL_TABLET | Freq: Four times a day (QID) | ORAL | Status: DC | PRN

## 2017-09-28 MED ORDER — SODIUM CHLORIDE 0.9 % IR SOLN
Status: DC | PRN
  Administered 2017-09-28: 3000 mL

## 2017-09-28 MED ORDER — FENTANYL CITRATE (PF) 100 MCG/2ML IJ SOLN
50.0000 ug | Freq: Once | INTRAMUSCULAR | Status: AC
  Administered 2017-09-28: 50 ug via INTRAVENOUS

## 2017-09-28 MED ORDER — ONDANSETRON HCL 4 MG/2ML IJ SOLN: 4.0000 mg | Freq: Once | INTRAMUSCULAR | Status: DC | PRN

## 2017-09-28 MED ORDER — BISACODYL 10 MG RE SUPP: 10.0000 mg | Freq: Every day | RECTAL | Status: DC | PRN

## 2017-09-28 MED ORDER — PNEUMOCOCCAL VAC POLYVALENT 25 MCG/0.5ML IJ INJ
0.5000 mL | INJECTION | INTRAMUSCULAR | Status: AC
  Administered 2017-09-29: 0.5 mL via INTRAMUSCULAR
  Filled 2017-09-28: qty 0.5

## 2017-09-28 MED ORDER — CLOPIDOGREL BISULFATE 75 MG PO TABS
75.0000 mg | ORAL_TABLET | Freq: Every day | ORAL | Status: DC
  Administered 2017-09-29 – 2017-10-01 (×3): 75 mg via ORAL
  Filled 2017-09-28 (×3): qty 1

## 2017-09-28 MED ORDER — OXYCODONE HCL 5 MG PO TABS: 5.0000 mg | ORAL_TABLET | Freq: Once | ORAL | Status: DC | PRN

## 2017-09-28 MED ORDER — CEFAZOLIN SODIUM-DEXTROSE 1-4 GM/50ML-% IV SOLN
1.0000 g | Freq: Four times a day (QID) | INTRAVENOUS | Status: AC
  Administered 2017-09-28 (×2): 1 g via INTRAVENOUS
  Filled 2017-09-28 (×3): qty 50

## 2017-09-28 MED ORDER — SODIUM CHLORIDE 0.9 % IV SOLN
INTRAVENOUS | Status: DC
  Administered 2017-09-28: 16:00:00 via INTRAVENOUS

## 2017-09-28 MED ORDER — TRANEXAMIC ACID 1000 MG/10ML IV SOLN
1000.0000 mg | INTRAVENOUS | Status: AC
  Administered 2017-09-28: 1000 mg via INTRAVENOUS
  Filled 2017-09-28: qty 1000

## 2017-09-28 MED ORDER — HYDROCHLOROTHIAZIDE 25 MG PO TABS
12.5000 mg | ORAL_TABLET | Freq: Every day | ORAL | Status: DC
  Administered 2017-09-29 – 2017-10-01 (×3): 12.5 mg via ORAL
  Filled 2017-09-28 (×3): qty 1

## 2017-09-28 MED ORDER — METHOCARBAMOL 1000 MG/10ML IJ SOLN
500.0000 mg | Freq: Four times a day (QID) | INTRAVENOUS | Status: DC | PRN
  Filled 2017-09-28: qty 5

## 2017-09-28 MED ORDER — POLYETHYLENE GLYCOL 3350 17 G PO PACK: 17.0000 g | PACK | Freq: Every day | ORAL | Status: DC | PRN

## 2017-09-28 MED ORDER — FENTANYL CITRATE (PF) 250 MCG/5ML IJ SOLN
INTRAMUSCULAR | Status: AC
  Filled 2017-09-28: qty 5

## 2017-09-28 MED ORDER — EPHEDRINE SULFATE 50 MG/ML IJ SOLN: 10.0000 mg | INTRAMUSCULAR | Status: DC | PRN

## 2017-09-28 MED ORDER — CARVEDILOL 12.5 MG PO TABS
12.5000 mg | ORAL_TABLET | Freq: Two times a day (BID) | ORAL | Status: DC
  Administered 2017-09-28 – 2017-10-01 (×6): 12.5 mg via ORAL
  Filled 2017-09-28 (×7): qty 1

## 2017-09-28 MED ORDER — ACETAMINOPHEN 160 MG/5ML PO SOLN: 325.0000 mg | ORAL | Status: DC | PRN

## 2017-09-28 MED ORDER — MUPIROCIN CALCIUM 2 % NA OINT: 1.0000 "application " | TOPICAL_OINTMENT | Freq: Two times a day (BID) | NASAL | Status: DC

## 2017-09-28 MED ORDER — AMLODIPINE BESYLATE 5 MG PO TABS
5.0000 mg | ORAL_TABLET | Freq: Every day | ORAL | Status: DC
  Administered 2017-09-29 – 2017-10-01 (×3): 5 mg via ORAL
  Filled 2017-09-28 (×3): qty 1

## 2017-09-28 MED ORDER — CHLORHEXIDINE GLUCONATE 4 % EX LIQD: 60.0000 mL | Freq: Once | CUTANEOUS | Status: DC

## 2017-09-28 SURGICAL SUPPLY — 56 items
ATTUNE MED DOME PAT 32 KNEE (Knees) ×1 IMPLANT
ATTUNE MED DOME PAT 32MM KNEE (Knees) ×1 IMPLANT
BASEPLATE TIB CMT FB PCKT SZ2 (Knees) ×2 IMPLANT
BLADE SAGITTAL 25.0X1.19X90 (BLADE) ×2 IMPLANT
BLADE SAGITTAL 25.0X1.19X90MM (BLADE) ×1
BLADE SAW SGTL 13X75X1.27 (BLADE) ×3 IMPLANT
BLADE SURG 21 STRL SS (BLADE) ×6 IMPLANT
BNDG COHESIVE 6X5 TAN STRL LF (GAUZE/BANDAGES/DRESSINGS) ×6 IMPLANT
BNDG GAUZE ELAST 4 BULKY (GAUZE/BANDAGES/DRESSINGS) ×3 IMPLANT
BOWL SMART MIX CTS (DISPOSABLE) ×3 IMPLANT
BSPLAT TIB 2 CMNT FXBRNG STRL (Knees) ×1 IMPLANT
CEMENT BONE R 1X40 (Cement) ×4 IMPLANT
COMP FEM CEMT ATTUNE SZ2 RT (Knees) ×3 IMPLANT
COMPONENT FEM CEMT ATTN SZ2 RT (Knees) IMPLANT
COVER SURGICAL LIGHT HANDLE (MISCELLANEOUS) ×3 IMPLANT
CUFF TOURNIQUET SINGLE 34IN LL (TOURNIQUET CUFF) ×3 IMPLANT
CUFF TOURNIQUET SINGLE 44IN (TOURNIQUET CUFF) IMPLANT
DRAPE EXTREMITY T 121X128X90 (DRAPE) ×3 IMPLANT
DRAPE HALF SHEET 40X57 (DRAPES) ×6 IMPLANT
DRAPE U-SHAPE 47X51 STRL (DRAPES) ×3 IMPLANT
DRESSING PREVENA PLUS CUSTOM (GAUZE/BANDAGES/DRESSINGS) IMPLANT
DRSG ADAPTIC 3X8 NADH LF (GAUZE/BANDAGES/DRESSINGS) ×3 IMPLANT
DRSG PAD ABDOMINAL 8X10 ST (GAUZE/BANDAGES/DRESSINGS) ×3 IMPLANT
DRSG PREVENA PLUS CUSTOM (GAUZE/BANDAGES/DRESSINGS) ×3
DURAPREP 26ML APPLICATOR (WOUND CARE) ×3 IMPLANT
ELECT REM PT RETURN 9FT ADLT (ELECTROSURGICAL) ×3
ELECTRODE REM PT RTRN 9FT ADLT (ELECTROSURGICAL) ×1 IMPLANT
FACESHIELD WRAPAROUND (MASK) ×3 IMPLANT
FACESHIELD WRAPAROUND OR TEAM (MASK) ×1 IMPLANT
GAUZE SPONGE 4X4 12PLY STRL (GAUZE/BANDAGES/DRESSINGS) ×3 IMPLANT
GLOVE BIOGEL PI IND STRL 9 (GLOVE) ×1 IMPLANT
GLOVE BIOGEL PI INDICATOR 9 (GLOVE) ×2
GLOVE SURG ORTHO 9.0 STRL STRW (GLOVE) ×3 IMPLANT
GOWN STRL REUS W/ TWL XL LVL3 (GOWN DISPOSABLE) ×2 IMPLANT
GOWN STRL REUS W/TWL XL LVL3 (GOWN DISPOSABLE) ×6
HANDPIECE INTERPULSE COAX TIP (DISPOSABLE) ×3
INSERT TIB ATTUNE FB SZ2X5 (Insert) ×2 IMPLANT
KIT BASIN OR (CUSTOM PROCEDURE TRAY) ×3 IMPLANT
KIT TURNOVER KIT B (KITS) ×3 IMPLANT
MANIFOLD NEPTUNE II (INSTRUMENTS) ×3 IMPLANT
NS IRRIG 1000ML POUR BTL (IV SOLUTION) ×3 IMPLANT
PACK TOTAL JOINT (CUSTOM PROCEDURE TRAY) ×3 IMPLANT
PAD ARMBOARD 7.5X6 YLW CONV (MISCELLANEOUS) ×3 IMPLANT
PIN STEINMAN FIXATION KNEE (PIN) ×3 IMPLANT
PREVENA RESTOR ARTHOFORM 33X30 (CANNISTER) ×2 IMPLANT
SET HNDPC FAN SPRY TIP SCT (DISPOSABLE) ×1 IMPLANT
STAPLER VISISTAT 35W (STAPLE) ×3 IMPLANT
SUCTION FRAZIER HANDLE 10FR (MISCELLANEOUS)
SUCTION TUBE FRAZIER 10FR DISP (MISCELLANEOUS) IMPLANT
SUT VIC AB 0 CT1 27 (SUTURE) ×3
SUT VIC AB 0 CT1 27XBRD ANBCTR (SUTURE) ×1 IMPLANT
SUT VIC AB 1 CTX 36 (SUTURE)
SUT VIC AB 1 CTX36XBRD ANBCTR (SUTURE) IMPLANT
TOWEL OR 17X24 6PK STRL BLUE (TOWEL DISPOSABLE) ×3 IMPLANT
TOWEL OR 17X26 10 PK STRL BLUE (TOWEL DISPOSABLE) ×3 IMPLANT
WRAP KNEE MAXI GEL POST OP (GAUZE/BANDAGES/DRESSINGS) ×3 IMPLANT

## 2017-09-28 NOTE — Anesthesia Postprocedure Evaluation (Signed)
Anesthesia Post Note  Patient: Julia Mcconnell  Procedure(s) Performed: RIGHT TOTAL KNEE ARTHROPLASTY (Right )     Patient location during evaluation: PACU Anesthesia Type: MAC and Spinal Level of consciousness: oriented and awake and alert Pain management: pain level controlled Vital Signs Assessment: post-procedure vital signs reviewed and stable Respiratory status: spontaneous breathing, respiratory function stable and patient connected to nasal cannula oxygen Cardiovascular status: blood pressure returned to baseline and stable Postop Assessment: no headache, no backache and no apparent nausea or vomiting Anesthetic complications: no    Last Vitals:  Vitals:   09/28/17 1300 09/28/17 1315  BP: (!) 126/47 (!) 121/51  Pulse: (!) 48 (!) 48  Resp: 16 11  Temp:    SpO2: 99% 97%    Last Pain:  Vitals:   09/28/17 1200  TempSrc:   PainSc: 0-No pain     Patient with bp 85/45 with heart rate 38-42 asymptomatic and speaking with [place].  Chose to slowly administer 25 mg ephedrine and subsequently 0.2 of glycopyrrolate to increase heart rate,   BP 125/57 with heart rate of 66 and ready for floor.            Adlyn Fife

## 2017-09-28 NOTE — Anesthesia Procedure Notes (Signed)
Spinal  Patient location during procedure: OR Start time: 09/28/2017 10:35 AM End time: 09/28/2017 10:40 AM Staffing Anesthesiologist: Janeece Riggers, MD Preanesthetic Checklist Completed: patient identified, site marked, surgical consent, pre-op evaluation, timeout performed, IV checked, risks and benefits discussed and monitors and equipment checked Spinal Block Patient position: sitting Prep: DuraPrep Patient monitoring: heart rate, cardiac monitor, continuous pulse ox and blood pressure Approach: midline Location: L3-4 Injection technique: single-shot Needle Needle type: Sprotte  Needle gauge: 24 G Needle length: 9 cm Assessment Sensory level: T4

## 2017-09-28 NOTE — Anesthesia Preprocedure Evaluation (Signed)
Anesthesia Evaluation  Patient identified by MRN, date of birth, ID band Patient awake    Reviewed: Allergy & Precautions, NPO status , Patient's Chart, lab work & pertinent test results, reviewed documented beta blocker date and time   History of Anesthesia Complications (+) Family history of anesthesia reactionNegative for: history of anesthetic complications  Airway Mallampati: II  TM Distance: >3 FB Neck ROM: Full    Dental no notable dental hx. (+) Caps, Partial Lower   Pulmonary sleep apnea and Continuous Positive Airway Pressure Ventilation ,    Pulmonary exam normal breath sounds clear to auscultation       Cardiovascular hypertension, Pt. on medications and Pt. on home beta blockers + Peripheral Vascular Disease  Normal cardiovascular exam+ Valvular Problems/Murmurs AI  Rhythm:Regular Rate:Normal - Systolic murmurs and - Diastolic murmurs LVEF 33-38% Moderate AI Grade 2 diastolic dysfunction Nuclear stress test - no evidence of reversible ischemia Fixed defect  Apical and apical/septal Low risk   Neuro/Psych TIAnegative psych ROS   GI/Hepatic negative GI ROS, Neg liver ROS,   Endo/Other  Hyperglycemia Hyperlipidemia  Renal/GU negative Renal ROS Bladder dysfunction  Urinary frequency    Musculoskeletal  (+) Arthritis , Osteoarthritis,  Osteoporosis Chronic LBP   Abdominal   Peds  Hematology negative hematology ROS (+)   Anesthesia Other Findings   Reproductive/Obstetrics                             Anesthesia Physical  Anesthesia Plan  ASA: III  Anesthesia Plan: Spinal and MAC   Post-op Pain Management:  Regional for Post-op pain   Induction: Intravenous  PONV Risk Score and Plan: 3 and Ondansetron, Dexamethasone and Treatment may vary due to age or medical condition  Airway Management Planned: Nasal Cannula, Natural Airway and Mask  Additional Equipment:  None  Intra-op Plan:   Post-operative Plan: Extubation in OR  Informed Consent: I have reviewed the patients History and Physical, chart, labs and discussed the procedure including the risks, benefits and alternatives for the proposed anesthesia with the patient or authorized representative who has indicated his/her understanding and acceptance.   Dental advisory given  Plan Discussed with: Anesthesiologist, CRNA and Surgeon  Anesthesia Plan Comments: (  )        Anesthesia Quick Evaluation

## 2017-09-28 NOTE — Evaluation (Signed)
Physical Therapy Evaluation Patient Details Name: Julia Mcconnell MRN: [id number] DOB: Aug 25, 1935 Today's Date: 09/28/2017   History of Present Illness  Pt is an 82 y/o female s/p R TKA. PMH includes HTN, OSA on CPAP, osteoporosis, CVA, L TKA, and L hip fx s/p repair.   Clinical Impression  Pt is s/p surgery above with deficits below. Pt very limited by pain throughout session and only able to perform stand pivot transfer to [facility] and then to recliner. Required mod A for mobility with use of RW. Educated about knee precautions. Pt reports she will be going to SNF at d/c. Will continue to follow acutely to maximize functional mobility independence and safety.     Follow Up Recommendations Follow surgeon's recommendation for DC plan and follow-up therapies;Supervision for mobility/OOB(per pt SNF )    Equipment Recommendations  None recommended by PT    Recommendations for Other Services       Precautions / Restrictions Precautions Precautions: Knee;Fall Precaution Booklet Issued: Yes (comment) Precaution Comments: Pt with wound vac. Reviewed knee precautions with pt.  Restrictions Weight Bearing Restrictions: Yes RLE Weight Bearing: Weight bearing as tolerated      Mobility  Bed Mobility Overal bed mobility: Needs Assistance Bed Mobility: Supine to Sit     Supine to sit: Mod assist     General bed mobility comments: Mod A for RLE assist and assist with scooting hips to EOB. Increased time required secondary to pain.   Transfers Overall transfer level: Needs assistance Equipment used: Rolling walker (2 wheeled) Transfers: Sit to/from [facility] Sit to Stand: Mod assist Stand pivot transfers: Mod assist       General transfer comment: Mod A for lift assist and steadying. Pt with very flexed trunk and requiring safety cues for safe use of RW during transfer. Mod A for steadying assist. Pt with very flexed posture throughout transfer.   Ambulation/Gait              General Gait Details: Deferred   Stairs            Wheelchair Mobility    Modified Rankin (Stroke Patients Only)       Balance Overall balance assessment: Needs assistance Sitting-balance support: No upper extremity supported;Feet supported Sitting balance-Leahy Scale: Fair     Standing balance support: Bilateral upper extremity supported;During functional activity Standing balance-Leahy Scale: Poor Standing balance comment: Reliant on BUE support and external support.                              Pertinent Vitals/Pain Pain Assessment: Faces Faces Pain Scale: Hurts whole lot Pain Location: R knee  Pain Descriptors / Indicators: Aching;Operative site guarding Pain Intervention(s): Limited activity within patient's tolerance;Monitored during session;Repositioned    Home Living Family/patient expects to be discharged to:: Skilled nursing facility                 Additional Comments: [facility] place per pt     Prior Function Level of Independence: Independent with assistive device(s)         Comments: Reports she used WC and RW for mobility tasks depending on the day.      Hand Dominance        Extremity/Trunk Assessment   Upper Extremity Assessment Upper Extremity Assessment: Generalized weakness    Lower Extremity Assessment Lower Extremity Assessment: RLE deficits/detail RLE Deficits / Details: Sensory in tact. Deficits consistent with post op pain  and weakness.     Cervical / Trunk Assessment Cervical / Trunk Assessment: Kyphotic  Communication   Communication: HOH  Cognition Arousal/Alertness: Awake/alert Behavior During Therapy: WFL for tasks assessed/performed Overall Cognitive Status: No family/caregiver present to determine baseline cognitive functioning                                 General Comments: Pt with some confusion when responding to questions.       General Comments       Exercises Total Joint Exercises Ankle Circles/Pumps: AROM;Both;20 reps   Assessment/Plan    PT Assessment Patient needs continued PT services  PT Problem List Decreased strength;Decreased activity tolerance;Decreased range of motion;Decreased balance;Decreased mobility;Decreased knowledge of use of DME;Decreased knowledge of precautions;Pain       PT Treatment Interventions DME instruction;Gait training;Functional mobility training;Therapeutic exercise;Therapeutic activities;Balance training;Patient/family education    PT Goals (Current goals can be found in the Care Plan section)  Acute Rehab PT Goals Patient Stated Goal: "for my pain to get better"  PT Goal Formulation: With patient Time For Goal Achievement: 10/12/17 Potential to Achieve Goals: Good    Frequency 7X/week   Barriers to discharge        Co-evaluation               AM-PAC PT "6 Clicks" Daily Activity  Outcome Measure Difficulty turning over in bed (including adjusting bedclothes, sheets and blankets)?: Unable Difficulty moving from lying on back to sitting on the side of the bed? : Unable Difficulty sitting down on and standing up from a chair with arms (e.g., wheelchair, bedside commode, etc,.)?: Unable Help needed moving to and from a bed to chair (including a wheelchair)?: A Lot Help needed walking in hospital room?: A Lot Help needed climbing 3-5 steps with a railing? : Total 6 Click Score: 8    End of Session Equipment Utilized During Treatment: Gait belt Activity Tolerance: Patient limited by pain Patient left: in chair;with call bell/phone within reach Nurse Communication: Mobility status PT Visit Diagnosis: Unsteadiness on feet (R26.81);Muscle weakness (generalized) (M62.81);Pain Pain - Right/Left: Right Pain - part of body: Knee    Time: 1448-1856 PT Time Calculation (min) (ACUTE ONLY): 38 min   Charges:   PT Evaluation $PT Eval Moderate Complexity: 1 Mod PT  Treatments $Therapeutic Activity: 23-37 mins        Leighton Ruff, PT, DPT  Acute Rehabilitation Services  Pager: [phone] Office: [phone]   Rudean Hitt 09/28/2017, 5:31 PM
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
## 2017-09-28 NOTE — Transfer of Care (Signed)
Immediate Anesthesia Transfer of Care Note  Patient: Julia Mcconnell  Procedure(s) Performed: RIGHT TOTAL KNEE ARTHROPLASTY (Right )  Patient Location: PACU  Anesthesia Type:MAC combined with regional for post-op pain  Level of Consciousness: awake, alert , oriented and patient cooperative  Airway & Oxygen Therapy: Patient Spontanous Breathing and Patient connected to nasal cannula oxygen  Post-op Assessment: Report given to RN and Post -op Vital signs reviewed and stable  Post vital signs: Reviewed and stable  Last Vitals:  Vitals Value Taken Time  BP 98/46 09/28/2017 12:00 PM  Temp    Pulse 43 09/28/2017 12:09 PM  Resp 10 09/28/2017 12:09 PM  SpO2 98 % 09/28/2017 12:09 PM  Vitals shown include unvalidated device data.  Last Pain:  Vitals:   09/28/17 0846  TempSrc: Axillary  PainSc: 0-No pain      Patients Stated Pain Goal: 3 (90/93/11 2162)  Complications: No apparent anesthesia complications

## 2017-09-28 NOTE — Anesthesia Procedure Notes (Signed)
Anesthesia Regional Block: Adductor canal block   Pre-Anesthetic Checklist: ,, timeout performed, Correct Patient, Correct Site, Correct Laterality, Correct Procedure, Correct Position, site marked, Risks and benefits discussed,  Surgical consent,  Pre-op evaluation,  At surgeon's request and post-op pain management  Laterality: Left  Prep: chloraprep       Needles:  Injection technique: Single-shot  Needle Type: Echogenic Stimulator Needle     Needle Length: 5cm  Needle Gauge: 22     Additional Needles:   Procedures:, nerve stimulator,,, ultrasound used (permanent image in chart),,,,  Narrative:  Start time: 09/28/2017 9:30 AM End time: 09/28/2017 9:40 AM Injection made incrementally with aspirations every 5 mL.  Performed by: Personally  Anesthesiologist: Janeece Riggers, MD  Additional Notes: Functioning IV was confirmed and monitors were applied.  A 81mm 22ga Arrow echogenic stimulator needle was used. Sterile prep and drape,hand hygiene and sterile gloves were used. Ultrasound guidance: relevant anatomy identified, needle position confirmed, local anesthetic spread visualized around nerve(s)., vascular puncture avoided.  Image printed for medical record. Negative aspiration and negative test dose prior to incremental administration of local anesthetic. The patient tolerated the procedure well.

## 2017-09-28 NOTE — Addendum Note (Signed)
Addendum  created 09/28/17 1322 by Janeece Riggers, MD   Order list changed, Order sets accessed

## 2017-09-28 NOTE — Discharge Instructions (Signed)
INSTRUCTIONS AFTER JOINT REPLACEMENT   o Remove items at home which could result in a fall. This includes throw rugs or furniture in walking pathways o ICE to the affected joint every three hours while awake for 30 minutes at a time, for at least the first 3-5 days, and then as needed for pain and swelling.  Continue to use ice for pain and swelling. You may notice swelling that will progress down to the foot and ankle.  This is normal after surgery.  Elevate your leg when you are not up walking on it.   o Continue to use the breathing machine you got in the hospital (incentive spirometer) which will help keep your temperature down.  It is common for your temperature to cycle up and down following surgery, especially at night when you are not up moving around and exerting yourself.  The breathing machine keeps your lungs expanded and your temperature down.   DIET:  As you were doing prior to hospitalization, we recommend a well-balanced diet.  DRESSING / WOUND CARE / SHOWERING  Do not change the dressing, continue with wound VAC  ACTIVITY  o Increase activity slowly as tolerated, but follow the weight bearing instructions below.   o No driving for 6 weeks or until further direction given by your physician.  You cannot drive while taking narcotics.  o No lifting or carrying greater than 10 lbs. until further directed by your surgeon. o Avoid periods of inactivity such as sitting longer than an hour when not asleep. This helps prevent blood clots.  o You may return to work once you are authorized by your doctor.     WEIGHT BEARING   Weight bearing as tolerated with assist device (walker, cane, etc) as directed, use it as long as suggested by your surgeon or therapist, typically at least 4-6 weeks.   EXERCISES  Results after joint replacement surgery are often greatly improved when you follow the exercise, range of motion and muscle strengthening exercises prescribed by your doctor. Safety  measures are also important to protect the joint from further injury. Any time any of these exercises cause you to have increased pain or swelling, decrease what you are doing until you are comfortable again and then slowly increase them. If you have problems or questions, call your caregiver or physical therapist for advice.   Rehabilitation is important following a joint replacement. After just a few days of immobilization, the muscles of the leg can become weakened and shrink (atrophy).  These exercises are designed to build up the tone and strength of the thigh and leg muscles and to improve motion. Often times heat used for twenty to thirty minutes before working out will loosen up your tissues and help with improving the range of motion but do not use heat for the first two weeks following surgery (sometimes heat can increase post-operative swelling).   These exercises can be done on a training (exercise) mat, on the floor, on a table or on a bed. Use whatever works the best and is most comfortable for you.    Use music or television while you are exercising so that the exercises are a pleasant break in your day. This will make your life better with the exercises acting as a break in your routine that you can look forward to.   Perform all exercises about fifteen times, three times per day or as directed.  You should exercise both the operative leg and the other leg as  well.  Exercises include:    Quad Sets - Tighten up the muscle on the front of the thigh (Quad) and hold for 5-10 seconds.    Straight Leg Raises - With your knee straight (if you were given a brace, keep it on), lift the leg to 60 degrees, hold for 3 seconds, and slowly lower the leg.  Perform this exercise against resistance later as your leg gets stronger.   Leg Slides: Lying on your back, slowly slide your foot toward your buttocks, bending your knee up off the floor (only go as far as is comfortable). Then slowly slide your  foot back down until your leg is flat on the floor again.   Angel Wings: Lying on your back spread your legs to the side as far apart as you can without causing discomfort.   Hamstring Strength:  Lying on your back, push your heel against the floor with your leg straight by tightening up the muscles of your buttocks.  Repeat, but this time bend your knee to a comfortable angle, and push your heel against the floor.  You may put a pillow under the heel to make it more comfortable if necessary.   A rehabilitation program following joint replacement surgery can speed recovery and prevent re-injury in the future due to weakened muscles. Contact your doctor or a physical therapist for more information on knee rehabilitation.    CONSTIPATION  Constipation is defined medically as fewer than three stools per week and severe constipation as less than one stool per week.  Even if you have a regular bowel pattern at home, your normal regimen is likely to be disrupted due to multiple reasons following surgery.  Combination of anesthesia, postoperative narcotics, change in appetite and fluid intake all can affect your bowels.   YOU MUST use at least one of the following options; they are listed in order of increasing strength to get the job done.  They are all available over the counter, and you may need to use some, POSSIBLY even all of these options:    Drink plenty of fluids (prune juice may be helpful) and high fiber foods Colace 100 mg by mouth twice a day  Senokot for constipation as directed and as needed Dulcolax (bisacodyl), take with full glass of water  Miralax (polyethylene glycol) once or twice a day as needed.  If you have tried all these things and are unable to have a bowel movement in the first 3-4 days after surgery call either your surgeon or your primary doctor.    If you experience loose stools or diarrhea, hold the medications until you stool forms back up.  If your symptoms do not get  better within 1 week or if they get worse, check with your doctor.  If you experience "the worst abdominal pain ever" or develop nausea or vomiting, please contact the office immediately for further recommendations for treatment.   ITCHING:  If you experience itching with your medications, try taking only a single pain pill, or even half a pain pill at a time.  You can also use Benadryl over the counter for itching or also to help with sleep.   TED HOSE STOCKINGS:  Use stockings on both legs until for at least 2 weeks or as directed by physician office. They may be removed at night for sleeping.  MEDICATIONS:  See your medication summary on the After Visit Summary that nursing will review with you.  You may have some home medications  which will be placed on hold until you complete the course of blood thinner medication.  It is important for you to complete the blood thinner medication as prescribed.  PRECAUTIONS:  If you experience chest pain or shortness of breath - call 911 immediately for transfer to the hospital emergency department.   If you develop a fever greater that 101 F, purulent drainage from wound, increased redness or drainage from wound, foul odor from the wound/dressing, or calf pain - CONTACT YOUR SURGEON.                                                   FOLLOW-UP APPOINTMENTS:  If you do not already have a post-op appointment, please call the office for an appointment to be seen by your surgeon.  Guidelines for how soon to be seen are listed in your After Visit Summary, but are typically between 1-4 weeks after surgery.  OTHER INSTRUCTIONS:   Knee Replacement:  Do not place pillow under knee, focus on keeping the knee straight while resting. CPM instructions: 0-90 degrees, 2 hours in the morning, 2 hours in the afternoon, and 2 hours in the evening. Place foam block, curve side up under heel at all times except when in CPM or when walking.  DO NOT modify, tear, cut, or change  the foam block in any way.  MAKE SURE YOU:   Understand these instructions.   Get help right away if you are not doing well or get worse.    Thank you for letting us be a part of your medical care team.  It is a privilege we respect greatly.  We hope these instructions will help you stay on track for a fast and full recovery!

## 2017-09-28 NOTE — Anesthesia Postprocedure Evaluation (Signed)
Anesthesia Post Note  Patient: Julia Mcconnell  Procedure(s) Performed: RIGHT TOTAL KNEE ARTHROPLASTY (Right )     Patient location during evaluation: PACU Anesthesia Type: MAC Level of consciousness: awake and alert Pain management: pain level controlled Vital Signs Assessment: post-procedure vital signs reviewed and stable Respiratory status: spontaneous breathing, nonlabored ventilation, respiratory function stable and patient connected to nasal cannula oxygen Cardiovascular status: stable and blood pressure returned to baseline Postop Assessment: no apparent nausea or vomiting Anesthetic complications: no    Last Vitals:  Vitals:   09/28/17 0846 09/28/17 1200  BP: (!) 157/47 (!) 98/46  Pulse: (!) 58 (!) 50  Resp: 18 13  Temp: (!) 35.6 C (!) 36.1 C  SpO2: 100% 96%    Last Pain:  Vitals:   09/28/17 1200  TempSrc:   PainSc: (P) 0-No pain                 [name]

## 2017-09-28 NOTE — Op Note (Addendum)
DATE OF SURGERY:  09/28/2017  TIME: 11:54 AM  PATIENT NAME:  Julia Mcconnell    AGE: 82 y.o.    PRE-OPERATIVE DIAGNOSIS:  Osteoarthritis Right Knee  POST-OPERATIVE DIAGNOSIS:  Osteoarthritis Right Knee  PROCEDURE:  Procedure(s): RIGHT TOTAL KNEE ARTHROPLASTY Apply Restor VAC  SURGEON: Meridee Score  ASSISTANT: April green  OPERATIVE IMPLANTS: Depuy , Posterior Stabilized.  Femur size 2, Tibia size 2, Patella size 32 3-peg oval button, with a 5 mm polyethylene insert.  @ENCIMAGES @      PREOPERATIVE INDICATIONS:   Julia Mcconnell is a 82 y.o. year old female with end stage degenerative arthritis of the knee who failed conservative treatment and elected for Total Knee Arthroplasty.   The risks, benefits, and alternatives were discussed at length including but not limited to the risks of infection, bleeding, nerve injury, stiffness, blood clots, the need for revision surgery, cardiopulmonary complications, among others, and they were willing to proceed.  OPERATIVE DESCRIPTION:  The patient was brought to the operative room and placed in a supine position.  General anesthesia was administered.  IV antibiotics were given.  The lower extremity was prepped and draped in the usual sterile fashion.  Julia Mcconnell was used to cover all exposed skin. Time out was performed.    Anterior quadriceps tendon splitting approach was performed.  The patella was everted and osteophytes were removed.  The anterior horn of the medial and lateral meniscus was removed.   The distal femur was opened with the drill and the intramedullary distal femoral cutting jig was utilized, set at 5 degrees valgus resecting 9 mm off the distal femur.  Care was taken to protect the collateral ligaments.  Then the extramedullary tibial cutting jig was utilized set for 3 degree posterior slope.  Care was taken during the cut to protect the medial and collateral ligaments.  The proximal tibia was removed along with the posterior  horns of the menisci.  The PCL was sacrificed.    The extensor gap was measured and was approximately 5 mm.    The distal femoral sizing jig was applied, taking care to avoid notching.  Then the 4-in-1 cutting jig was applied and the anterior and posterior femur was cut, along with the chamfer cuts.  All posterior osteophytes were removed.  The flexion gap was then measured and was symmetric with the extension gap.  The distal femoral preparation using the appropriate jig to prepare the box.  The patella was then measured, and cut with the saw.    The proximal tibia sized and prepared accordingly with the reamer and the punch, and then all components were trialed with the poly insert.  The knee was found to have stable balance and full motion.  The knee was irrigated with normal saline and the knee was soaked with TXA.  The above named components were then cemented into place and all excess cement was removed.  The final polyethylene component was in place during cementation.  The knee was kept in extension until the cement hardened.  The knee was then taken through a range of motion and the patella tracked well and the knee irrigated copiously and the parapatellar and subcutaneous tissue closed with vicryl, and skin closed with staples..  A sterile Restor VAC dressing was applied and patient  was taken to the PACU in stable  condition.  There were no complications.  Total tourniquet time was 11 minutes.

## 2017-09-28 NOTE — Addendum Note (Signed)
Addendum  created 09/28/17 1402 by Janeece Riggers, MD   Sign clinical note

## 2017-09-28 NOTE — Anesthesia Procedure Notes (Deleted)
Anesthesia Regional Block: Narrative:       

## 2017-09-28 NOTE — Plan of Care (Signed)

## 2017-09-29 ENCOUNTER — Encounter (HOSPITAL_COMMUNITY): Payer: Self-pay | Admitting: Orthopedic Surgery

## 2017-09-29 NOTE — Progress Notes (Signed)
Pt. Temp. 95.1 rectal at 0430  since we could not get Pt temp via other route for the past 16 hours , assigned doctor notified, alert & oriented, did not complained of any cold,heat adjusted in Pt room,and Pt covered with extra blanket, no further instruction was given, will recheck it again in an hour and  will continue to monitor. Thanks.

## 2017-09-29 NOTE — Progress Notes (Signed)
Patient is on CPAP at this time. Pt has her own mask. Pt settings is adjusted per her home regimen. Pt is stable at this time tolerating it well, no distress or complications noted

## 2017-09-29 NOTE — Clinical Social Work Note (Signed)
Clinical Social Work Assessment  Patient Details  Name: Julia Mcconnell MRN: [id number] Date of Birth: September 04, 1935  Date of referral:  09/29/17               Reason for consult:  Facility Placement                Permission sought to share information with:  Facility Sport and exercise psychologist, Family Supports Permission granted to share information::  Yes, Verbal Permission Granted  Name::     [place]::  SNFs  Relationship::  cousin  Contact Information:  [phone]  Housing/Transportation Living arrangements for the past 2 months:  Single Family Home Source of Information:  Patient Patient Interpreter Needed:  None Criminal Activity/Legal Involvement Pertinent to Current Situation/Hospitalization:  No - Comment as needed Significant Relationships:    Lives with:  Self Do you feel safe going back to the place where you live?  No Need for family participation in patient care:  Yes (Comment)  Care giving concerns:  CSW received consult for discharge needs. CSW spoke with patient regarding PT recommendation of SNF placement at time of discharge. Patient reported that she lives alone in a single level home.     Social Worker assessment / plan:  CSW spoke with patient concerning possibility of rehab at [facility] before returning home.   Employment status:  Retired Forensic scientist:  Medicare PT Recommendations:    Information / Referral to community resources:  [place]  Patient/Family's Response to care: Patient recognizes need for rehab before returning home and is agreeable to a SNF in [place]. Patient reported preference for [facility].  Patient/Family's Understanding of and Emotional Response to Diagnosis, Current Treatment, and Prognosis:  Patient is realistic regarding therapy needs and expressed being hopeful for SNF placement. Patient expressed understanding of CSW role and discharge process as well as medical condition. No  questions/concerns about plan or treatment.   Emotional Assessment Appearance:  Appears stated age Attitude/Demeanor/Rapport:  Engaged Affect (typically observed):  Accepting, Appropriate, Hopeful Orientation:  Oriented to Self, Oriented to Place, Oriented to  Time, Oriented to Situation Alcohol / Substance use:  Not Applicable Psych involvement (Current and /or in the community):  No (Comment)  Discharge Needs  Concerns to be addressed:  Basic Needs Readmission within the last 30 days:  No Current discharge risk:  Dependent with Mobility Barriers to Discharge:  Continued Medical Work up   [employer], [place] 09/29/2017, 12:03 PM
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
## 2017-09-29 NOTE — Plan of Care (Signed)
  Problem: Coping: Goal: Level of anxiety will decrease Outcome: Progressing   Problem: Pain Managment: Goal: General experience of comfort will improve Outcome: Progressing   Problem: Safety: Goal: Ability to remain free from injury will improve Outcome: Progressing   

## 2017-09-29 NOTE — Progress Notes (Signed)
Physical Therapy Treatment Patient Details Name: Julia Mcconnell MRN: [id number] DOB: 1935-02-23 Today's Date: 09/29/2017    History of Present Illness Pt is an 82 y/o female s/p R TKA. PMH includes HTN, OSA on CPAP, osteoporosis, CVA, L TKA, and L hip fx s/p repair.     PT Comments    Pt making slow progress with functional mobility and remains very limited secondary to pain and weakness. Pt continues to require mod A overall for mobility. Only able to tolerate transfers at this time. Pt would continue to benefit from skilled physical therapy services at this time while admitted and after d/c to address the below listed limitations in order to improve overall safety and independence with functional mobility.    Follow Up Recommendations  SNF     Equipment Recommendations  None recommended by PT    Recommendations for Other Services       Precautions / Restrictions Precautions Precautions: Knee;Fall Precaution Comments: wound VAC Restrictions Weight Bearing Restrictions: Yes RLE Weight Bearing: Weight bearing as tolerated    Mobility  Bed Mobility Overal bed mobility: Needs Assistance Bed Mobility: Supine to Sit     Supine to sit: Mod assist     General bed mobility comments: mod A for R LE movement off of bed; use of bed pads to position pt's hips at EOB  Transfers Overall transfer level: Needs assistance Equipment used: Rolling walker (2 wheeled) Transfers: Sit to/from [facility] Sit to Stand: Mod assist Stand pivot transfers: Mod assist       General transfer comment: mod A to power into standing from EOB and for stability with transitional movement; pt with very flexed posture throughout  Ambulation/Gait             General Gait Details: unable   Stairs             Wheelchair Mobility    Modified Rankin (Stroke Patients Only)       Balance Overall balance assessment: Needs assistance Sitting-balance support: No upper  extremity supported;Feet supported Sitting balance-Leahy Scale: Fair     Standing balance support: Bilateral upper extremity supported;During functional activity Standing balance-Leahy Scale: Poor                              Cognition Arousal/Alertness: Awake/alert Behavior During Therapy: WFL for tasks assessed/performed Overall Cognitive Status: No family/caregiver present to determine baseline cognitive functioning Area of Impairment: Orientation;Memory;Safety/judgement;Following commands;Problem solving                 Orientation Level: Disoriented to;Time   Memory: Decreased short-term memory;Decreased recall of precautions Following Commands: Follows one step commands consistently;Follows one step commands with increased time Safety/Judgement: Decreased awareness of safety;Decreased awareness of deficits   Problem Solving: Difficulty sequencing;Requires verbal cues;Requires tactile cues        Exercises Total Joint Exercises Ankle Circles/Pumps: AAROM;Right;10 reps;Seated Heel Slides: AAROM;Right;10 reps;Seated Hip ABduction/ADduction: AAROM;Right;10 reps;Seated    General Comments        Pertinent Vitals/Pain Pain Assessment: Faces Faces Pain Scale: Hurts even more Pain Location: R knee  Pain Descriptors / Indicators: Aching;Operative site guarding Pain Intervention(s): Monitored during session;Repositioned    Home Living                      Prior Function            PT Goals (current goals can now be found  in the care plan section) Acute Rehab PT Goals PT Goal Formulation: With patient Time For Goal Achievement: 10/12/17 Potential to Achieve Goals: Good Progress towards PT goals: Progressing toward goals    Frequency    7X/week      PT Plan Current plan remains appropriate    Co-evaluation              AM-PAC PT "6 Clicks" Daily Activity  Outcome Measure  Difficulty turning over in bed (including  adjusting bedclothes, sheets and blankets)?: Unable Difficulty moving from lying on back to sitting on the side of the bed? : Unable Difficulty sitting down on and standing up from a chair with arms (e.g., wheelchair, bedside commode, etc,.)?: Unable Help needed moving to and from a bed to chair (including a wheelchair)?: A Lot Help needed walking in hospital room?: A Lot Help needed climbing 3-5 steps with a railing? : Total 6 Click Score: 8    End of Session Equipment Utilized During Treatment: Gait belt Activity Tolerance: Patient limited by pain Patient left: in chair;with call bell/phone within reach Nurse Communication: Mobility status PT Visit Diagnosis: Unsteadiness on feet (R26.81);Muscle weakness (generalized) (M62.81);Pain Pain - Right/Left: Right Pain - part of body: Knee     Time: 1740-8144 PT Time Calculation (min) (ACUTE ONLY): 21 min  Charges:  $Therapeutic Activity: 8-22 mins                     Sherie Don, PT, DPT  Acute Rehabilitation Services Pager [phone] Office [place] 09/29/2017, 11:09 AM

## 2017-09-30 MED ORDER — HYDROCODONE-ACETAMINOPHEN 5-325 MG PO TABS: 1.0000 | ORAL_TABLET | ORAL | 0 refills | Status: DC | PRN

## 2017-09-30 NOTE — Plan of Care (Signed)
  Problem: Education: Goal: Knowledge of General Education information will improve Description Including pain rating scale, medication(s)/side effects and non-pharmacologic comfort measures Outcome: Progressing   Problem: Education: Goal: Knowledge of General Education information will improve Description Including pain rating scale, medication(s)/side effects and non-pharmacologic comfort measures Outcome: Progressing   Problem: Nutrition: Goal: Adequate nutrition will be maintained Outcome: Progressing   Problem: Coping: Goal: Level of anxiety will decrease Outcome: Progressing   Problem: Pain Managment: Goal: General experience of comfort will improve Outcome: Progressing   Problem: Safety: Goal: Ability to remain free from injury will improve Outcome: Progressing   Problem: Safety: Goal: Ability to remain free from injury will improve Outcome: Progressing

## 2017-09-30 NOTE — Discharge Summary (Signed)
Discharge Diagnoses:  Active Problems:   S/P total knee arthroplasty   Surgeries: Procedure(s): RIGHT TOTAL KNEE ARTHROPLASTY on 09/28/2017    Consultants:   Discharged Condition: Improved  Hospital Course: Julia Mcconnell is an 82 y.o. female who was admitted 09/28/2017 with a chief complaint of right knee pain and decreased functional ambulation, with a final diagnosis of Osteoarthritis Right Knee.  Patient was brought to the operating room on 09/28/2017 and underwent Procedure(s): RIGHT TOTAL KNEE ARTHROPLASTY.    Patient was given perioperative antibiotics:  Anti-infectives (From admission, onward)   Start     Dose/Rate Route Frequency Ordered Stop   09/28/17 1700  ceFAZolin (ANCEF) IVPB 1 g/50 mL premix     1 g 100 mL/hr over 30 Minutes Intravenous Every 6 hours 09/28/17 1351 09/28/17 2141   09/28/17 0915  vancomycin (VANCOCIN) IVPB 1000 mg/200 mL premix     1,000 mg 200 mL/hr over 60 Minutes Intravenous To Surgery 09/28/17 0826 09/28/17 0958   09/28/17 0830  ceFAZolin (ANCEF) IVPB 2g/100 mL premix     2 g 200 mL/hr over 30 Minutes Intravenous On call to O.R. 09/28/17 0827 09/28/17 1043   09/28/17 0830  ceFAZolin (ANCEF) 2-4 GM/100ML-% IVPB    Note to Pharmacy:  [name]   : cabinet override      09/28/17 0830 09/28/17 1043    .  Patient was given sequential compression devices, early ambulation, and aspirin for DVT prophylaxis.  Recent vital signs:  Patient Vitals for the past 24 hrs:  BP Temp Temp src Pulse Resp SpO2  09/30/17 0937 (!) 146/85 98.7 F (37.1 C) Oral 94 16 97 %  09/30/17 0457 (!) 149/53 99.8 F (37.7 C) Oral 76 14 96 %  09/29/17 1951 139/63 (!) 96.8 F (36 C) - (!) 59 16 94 %  09/29/17 1650 (!) 128/49 - - 65 - -  09/29/17 1453 (!) 128/49 98 F (36.7 C) Oral 65 18 96 %  .  Recent laboratory studies: No results found.  Discharge Medications:   Allergies as of 09/30/2017      Reactions   Codeine Other (See Comments)   Altered mental  status Has tolerated Vicodin      Medication List    TAKE these medications   acetaminophen 500 MG tablet Commonly known as:  TYLENOL Take 1 tablet (500 mg total) by mouth every 6 (six) hours as needed for mild pain.   amLODipine 5 MG tablet Commonly known as:  NORVASC Take 5 mg by mouth daily.   aspirin EC 81 MG tablet Take 1 tablet (81 mg total) by mouth daily.   atorvastatin 20 MG tablet Commonly known as:  LIPITOR Take 20 mg by mouth daily.   CALCIUM 600+D3 PO Take 1 tablet by mouth 2 (two) times daily.   carvedilol 12.5 MG tablet Commonly known as:  COREG TAKE 1/2 (ONE-HALF) TABLET BY MOUTH TWICE DAILY WITH  MEALS What changed:  See the new instructions.   clopidogrel 75 MG tablet Commonly known as:  PLAVIX Take 1 tablet (75 mg total) by mouth daily. What changed:  when to take this   hydrochlorothiazide 25 MG tablet Commonly known as:  HYDRODIURIL Take 12.5 mg by mouth daily.   HYDROcodone-acetaminophen 5-325 MG tablet Commonly known as:  NORCO/VICODIN Take 1-2 tablets by mouth every 4 (four) hours as needed for moderate pain or severe pain.   losartan 100 MG tablet Commonly known as:  COZAAR Take 100 mg by mouth daily.  losartan 50 MG tablet Commonly known as:  COZAAR Take 1 tablet (50 mg total) by mouth daily. Hold until follow-up with your doctor or BP normal   mupirocin nasal ointment 2 % Commonly known as:  BACTROBAN Place 1 application into the nose 2 (two) times daily for 5 days. Use one-half of tube in each nostril twice daily for five (5) days prior to surgery on the 19th. After application, press sides of nose together and gently massage.            Discharge Care Instructions  (From admission, onward)         Start     Ordered   09/30/17 0000  Weight bearing as tolerated     09/30/17 0759          Diagnostic Studies: No results found.  Patient benefited maximally from their hospital stay and there were no complications.      Disposition: Discharge disposition: 62-Rehab Facility      Discharge Instructions    Call MD / Call 911   Complete by:  As directed    If you experience chest pain or shortness of breath, CALL 911 and be transported to the hospital emergency room.  If you develope a fever above 101 F, pus (white drainage) or increased drainage or redness at the wound, or calf pain, call your surgeon's office.   Constipation Prevention   Complete by:  As directed    Drink plenty of fluids.  Prune juice may be helpful.  You may use a stool softener, such as Colace (over the counter) 100 mg twice a day.  Use MiraLax (over the counter) for constipation as needed.   Diet - low sodium heart healthy   Complete by:  As directed    Discharge instructions   Complete by:  As directed    Continue Prevena VAC at discharge. Make sure VAC is plugged into wall socket except when the patient is mobilizing so that it will keep a charge.   Do not put a pillow under the knee. Place it under the heel.   Complete by:  As directed    Increase activity slowly as tolerated   Complete by:  As directed    Negative Pressure Wound Therapy - Incisional   Complete by:  As directed    Keep Prevena Plus VAC in place . Make sure the machine is plugged into wall socket except when patient up mobilizing.   Weight bearing as tolerated   Complete by:  As directed      Follow-up Information    [name], MD In 2 weeks.   Specialty:  Orthopedic Surgery Contact information: [place] [address] [phone]            Signed: Erlinda Hong 09/30/2017, 9:49 AM

## 2017-09-30 NOTE — Progress Notes (Signed)
Subjective: 2 Days Post-Op Procedure(s) (LRB): RIGHT TOTAL KNEE ARTHROPLASTY (Right) Patient reports pain as moderate.   Planning SNF rehab Objective: Vital signs in last 24 hours: Temp:  [96.8 F (36 C)-99.8 F (37.7 C)] 99.8 F (37.7 C) (09/20 0457) Pulse Rate:  [59-76] 76 (09/20 0457) Resp:  [14-18] 14 (09/20 0457) BP: (128-176)/(49-63) 149/53 (09/20 0457) SpO2:  [94 %-96 %] 96 % (09/20 0457)  Intake/Output from previous day: 09/19 0701 - 09/20 0700 In: 240 [P.O.:240] Out: 1 [Urine:1] Intake/Output this shift: No intake/output data recorded.  No results for input(s): HGB in the last 72 hours. No results for input(s): WBC, RBC, HCT, PLT in the last 72 hours. No results for input(s): NA, K, CL, CO2, BUN, CREATININE, GLUCOSE, CALCIUM in the last 72 hours. No results for input(s): LABPT, INR in the last 72 hours.  Neurologically intact  Incisional VAC in place over right knee and functioning well.    Anticipated LOS equal to or greater than 2 midnights due to - Age 1 and older with one or more of the following:  - Obesity  - Expected need for hospital services (PT, OT, Nursing) required for safe  discharge  - Anticipated need for postoperative skilled nursing care or inpatient rehab  - Active co-morbidities: None OR   - Unanticipated findings during/Post Surgery: None  - Patient is a high risk of re-admission due to: Barriers to post-acute care (logistical, no family support in home)   Assessment/Plan: 2 Days Post-Op Procedure(s) (LRB): RIGHT TOTAL KNEE ARTHROPLASTY (Right) Up with therapy Plan discharge to SNF rehab today.  Prescription for Norco on chart.    [place]

## 2017-09-30 NOTE — Progress Notes (Signed)
Patient states she is able to place self on and off of CPAP when ready. RT filled water chamber with sterile water and placed unit within reach of patient. RT informed patient if she has any trouble have RN contact RT.

## 2017-09-30 NOTE — Progress Notes (Signed)
Physical Therapy Treatment Patient Details Name: Julia Mcconnell MRN: [id number] DOB: 12/12/1935 Today's Date: 09/30/2017    History of Present Illness Pt is an 82 y/o female s/p R TKA. PMH includes HTN, OSA on CPAP, osteoporosis, CVA, L TKA, and L hip fx s/p repair.     PT Comments    Pt performed gait training and functional mobility with max cues for encouragement.  Pt able to advance to gt training this session with close chair follow.  Pt guided through LE exercises. She continues to lack flexion and extension.  Pt positioned in supported extension post session to improve knee extension.  Plan for SNF remains appropriate at this time based on patient's level of function.        Follow Up Recommendations  SNF     Equipment Recommendations  None recommended by PT    Recommendations for Other Services       Precautions / Restrictions Precautions Precautions: Knee;Fall Precaution Booklet Issued: Yes (comment) Precaution Comments: wound VAC Restrictions Weight Bearing Restrictions: Yes RLE Weight Bearing: Weight bearing as tolerated    Mobility  Bed Mobility               General bed mobility comments: Pt sitting edge of bed with NT present on arrival.    Transfers Overall transfer level: Needs assistance Equipment used: Rolling walker (2 wheeled) Transfers: Sit to/from Stand Sit to Stand: Mod assist         General transfer comment: mod A to power into standing from EOB and for stability with transitional movement; pt with very flexed posture throughout  Ambulation/Gait Ambulation/Gait assistance: Mod assist Gait Distance (Feet): 15 Feet Assistive device: Rolling walker (2 wheeled) Gait Pattern/deviations: Step-to pattern;Decreased stride length;Shuffle;Trunk flexed;Decreased stance time - right;Decreased dorsiflexion - right     General Gait Details: Cues for upper trunk control, use of youth RW for improved fit.  Close chair follow as patient fatigues  quickly.  Cues for R knee extension in stance phase.     Stairs             Wheelchair Mobility    Modified Rankin (Stroke Patients Only)       Balance Overall balance assessment: Needs assistance   Sitting balance-Leahy Scale: Fair       Standing balance-Leahy Scale: Poor                              Cognition Arousal/Alertness: Awake/alert Behavior During Therapy: WFL for tasks assessed/performed Overall Cognitive Status: No family/caregiver present to determine baseline cognitive functioning Area of Impairment: Orientation;Memory;Safety/judgement;Following commands;Problem solving                 Orientation Level: Disoriented to;Time   Memory: Decreased short-term memory;Decreased recall of precautions Following Commands: Follows one step commands consistently;Follows one step commands with increased time Safety/Judgement: Decreased awareness of safety;Decreased awareness of deficits   Problem Solving: Difficulty sequencing;Requires verbal cues;Requires tactile cues General Comments: Pt with some confusion when responding to questions.       Exercises Total Joint Exercises Ankle Circles/Pumps: AAROM;10 reps;Seated;Left Quad Sets: AROM;Left;10 reps;Supine Short Arc Quad: Left;10 reps;Supine;AAROM Heel Slides: Left;10 reps;Supine;AAROM Hip ABduction/ADduction: AAROM;Left;10 reps;Supine Straight Leg Raises: AAROM;Left;10 reps;Supine Goniometric ROM: 20-50 degrees.      General Comments        Pertinent Vitals/Pain Pain Assessment: Faces Faces Pain Scale: Hurts even more Pain Location: R knee  Pain Descriptors / Indicators: Aching;Operative  site guarding Pain Intervention(s): Monitored during session;Repositioned    Home Living                      Prior Function            PT Goals (current goals can now be found in the care plan section) Acute Rehab PT Goals Patient Stated Goal: "for my pain to get better"   Potential to Achieve Goals: Good Progress towards PT goals: Progressing toward goals    Frequency    7X/week      PT Plan Current plan remains appropriate    Co-evaluation              AM-PAC PT "6 Clicks" Daily Activity  Outcome Measure  Difficulty turning over in bed (including adjusting bedclothes, sheets and blankets)?: Unable Difficulty moving from lying on back to sitting on the side of the bed? : Unable Difficulty sitting down on and standing up from a chair with arms (e.g., wheelchair, bedside commode, etc,.)?: Unable Help needed moving to and from a bed to chair (including a wheelchair)?: A Lot Help needed walking in hospital room?: A Lot Help needed climbing 3-5 steps with a railing? : Total 6 Click Score: 8    End of Session Equipment Utilized During Treatment: Gait belt Activity Tolerance: Patient limited by pain Patient left: in chair;with call bell/phone within reach Nurse Communication: Mobility status PT Visit Diagnosis: Unsteadiness on feet (R26.81);Muscle weakness (generalized) (M62.81);Pain Pain - Right/Left: Right Pain - part of body: Knee     Time: 1133-1201 PT Time Calculation (min) (ACUTE ONLY): 28 min  Charges:  $Gait Training: 8-22 mins $Therapeutic Exercise: 8-22 mins                     Governor Rooks, PTA Acute Rehabilitation Services Pager [phone] Office [phone]     Anush Wiedeman Eli Hose 09/30/2017, 3:34 PM

## 2017-10-01 DIAGNOSIS — M25561 Pain in right knee: Secondary | ICD-10-CM | POA: Diagnosis not present

## 2017-10-01 DIAGNOSIS — R2689 Other abnormalities of gait and mobility: Secondary | ICD-10-CM | POA: Diagnosis not present

## 2017-10-01 DIAGNOSIS — R4189 Other symptoms and signs involving cognitive functions and awareness: Secondary | ICD-10-CM | POA: Diagnosis not present

## 2017-10-01 DIAGNOSIS — I10 Essential (primary) hypertension: Secondary | ICD-10-CM | POA: Diagnosis not present

## 2017-10-01 DIAGNOSIS — M1711 Unilateral primary osteoarthritis, right knee: Secondary | ICD-10-CM | POA: Diagnosis not present

## 2017-10-01 DIAGNOSIS — Z9889 Other specified postprocedural states: Secondary | ICD-10-CM | POA: Diagnosis not present

## 2017-10-01 DIAGNOSIS — M2550 Pain in unspecified joint: Secondary | ICD-10-CM | POA: Diagnosis not present

## 2017-10-01 DIAGNOSIS — M6281 Muscle weakness (generalized): Secondary | ICD-10-CM | POA: Diagnosis not present

## 2017-10-01 DIAGNOSIS — Z471 Aftercare following joint replacement surgery: Secondary | ICD-10-CM | POA: Diagnosis not present

## 2017-10-01 DIAGNOSIS — I119 Hypertensive heart disease without heart failure: Secondary | ICD-10-CM | POA: Diagnosis not present

## 2017-10-01 DIAGNOSIS — K5903 Drug induced constipation: Secondary | ICD-10-CM | POA: Diagnosis not present

## 2017-10-01 DIAGNOSIS — Z7401 Bed confinement status: Secondary | ICD-10-CM | POA: Diagnosis not present

## 2017-10-01 DIAGNOSIS — Z96651 Presence of right artificial knee joint: Secondary | ICD-10-CM | POA: Diagnosis not present

## 2017-10-01 DIAGNOSIS — Z23 Encounter for immunization: Secondary | ICD-10-CM | POA: Diagnosis not present

## 2017-10-01 DIAGNOSIS — E783 Hyperchylomicronemia: Secondary | ICD-10-CM | POA: Diagnosis not present

## 2017-10-01 DIAGNOSIS — I351 Nonrheumatic aortic (valve) insufficiency: Secondary | ICD-10-CM | POA: Diagnosis not present

## 2017-10-01 DIAGNOSIS — Z8673 Personal history of transient ischemic attack (TIA), and cerebral infarction without residual deficits: Secondary | ICD-10-CM | POA: Diagnosis not present

## 2017-10-01 DIAGNOSIS — D649 Anemia, unspecified: Secondary | ICD-10-CM | POA: Diagnosis not present

## 2017-10-01 DIAGNOSIS — Z7409 Other reduced mobility: Secondary | ICD-10-CM | POA: Diagnosis not present

## 2017-10-01 DIAGNOSIS — D72819 Decreased white blood cell count, unspecified: Secondary | ICD-10-CM | POA: Diagnosis not present

## 2017-10-01 DIAGNOSIS — M24561 Contracture, right knee: Secondary | ICD-10-CM | POA: Diagnosis not present

## 2017-10-01 DIAGNOSIS — Z79899 Other long term (current) drug therapy: Secondary | ICD-10-CM | POA: Diagnosis not present

## 2017-10-01 NOTE — Clinical Social Work Placement (Signed)
   CLINICAL SOCIAL WORK PLACEMENT  NOTE  Date:  10/01/2017  Patient Details  Name: Julia Mcconnell MRN: [id number] Date of Birth: [birth year]  Clinical Social Work is seeking post-discharge placement for this patient at the [place] level of care (*CSW will initial, date and re-position this form in  chart as items are completed):      Patient/family provided with [place] Work Department's list of facilities offering this level of care within the geographic area requested by the patient (or if unable, by the patient's family).  Yes   Patient/family informed of their freedom to choose among providers that offer the needed level of care, that participate in Medicare, Medicaid or managed care program needed by the patient, have an available bed and are willing to accept the patient.      Patient/family informed of [facility]'s ownership interest in [facility] and [facility], as well as of the fact that they are under no obligation to receive care at these facilities.  PASRR submitted to EDS on       PASRR number received on 09/29/17     Existing PASRR number confirmed on       FL2 transmitted to all facilities in geographic area requested by pt/family on 09/29/17     FL2 transmitted to all facilities within larger geographic area on       Patient informed that his/her managed care company has contracts with or will negotiate with certain facilities, including the following:        Yes   Patient/family informed of bed offers received.  Patient chooses bed at [facility]     Physician recommends and patient chooses bed at      Patient to be transferred to [facility] on 10/01/17.  Patient to be transferred to facility by [facility]     Patient family notified on 10/01/17 of transfer.  Name of family member notified:  pt alert and oriented x4     PHYSICIAN       Additional Comment:     _______________________________________________ Eileen Stanford, LCSW 10/01/2017, 1:54 PM

## 2017-10-01 NOTE — Clinical Social Work Note (Signed)
Facility can not order wound vac until Monday. Pt will have to d/c with wound vac. Facility states they will return wound vac on Monday when replaced.   [place], [place]

## 2017-10-01 NOTE — Progress Notes (Signed)
Physical Therapy Treatment Patient Details Name: Julia Mcconnell MRN: [id number] DOB: [birth year] Today's Date: 10/01/2017    History of Present Illness Pt is an 82 y/o female s/p R TKA. PMH includes HTN, OSA on CPAP, osteoporosis, CVA, L TKA, and L hip fx s/p repair.     PT Comments    Pt received in bed agreeable to participation in therapy. Pt required mod assist bed mobility, mod assist sit to stand with RW and mod assist SPT with RW. Pt performed RLE exercises with feet elevated in recliner. Per progress notes, pt to discharge to SNF today.   Follow Up Recommendations  SNF     Equipment Recommendations  None recommended by PT    Recommendations for Other Services       Precautions / Restrictions Precautions Precautions: Knee;Fall;Other (comment) Precaution Comments: wound VAC Restrictions RLE Weight Bearing: Weight bearing as tolerated    Mobility  Bed Mobility Overal bed mobility: Needs Assistance Bed Mobility: Supine to Sit     Supine to sit: Mod assist;HOB elevated     General bed mobility comments: assist with BLE off EOB and to elevate trunk  Transfers Overall transfer level: Needs assistance Equipment used: Rolling walker (2 wheeled) Transfers: Sit to/from [facility] Sit to Stand: Mod assist Stand pivot transfers: Mod assist       General transfer comment: cues for hand placement, assist to power up, pt able to take pivot steps toward left bed to recliner with flexed posture, assist with RW management and to maintain balance  Ambulation/Gait                 Stairs             Wheelchair Mobility    Modified Rankin (Stroke Patients Only)       Balance Overall balance assessment: Needs assistance Sitting-balance support: No upper extremity supported;Feet supported Sitting balance-Leahy Scale: Fair     Standing balance support: Bilateral upper extremity supported;During functional activity Standing  balance-Leahy Scale: Poor Standing balance comment: Reliant on BUE support and external support.                             Cognition Arousal/Alertness: Awake/alert Behavior During Therapy: WFL for tasks assessed/performed Overall Cognitive Status: No family/caregiver present to determine baseline cognitive functioning Area of Impairment: Orientation;Memory;Safety/judgement;Following commands;Problem solving                 Orientation Level: Disoriented to;Time;Place   Memory: Decreased short-term memory;Decreased recall of precautions Following Commands: Follows one step commands consistently;Follows one step commands with increased time Safety/Judgement: Decreased awareness of safety;Decreased awareness of deficits   Problem Solving: Difficulty sequencing;Requires verbal cues;Requires tactile cues General Comments: mild confusion      Exercises Total Joint Exercises Ankle Circles/Pumps: Both;10 reps;AROM Quad Sets: AAROM;Right;10 reps Heel Slides: AAROM;Right;10 reps Hip ABduction/ADduction: AAROM;Right;10 reps Goniometric ROM: 20-50 degrees R knee    General Comments        Pertinent Vitals/Pain Pain Assessment: Faces Faces Pain Scale: Hurts even more Pain Location: R knee  Pain Descriptors / Indicators: Aching;Operative site guarding Pain Intervention(s): Limited activity within patient's tolerance;Repositioned    Home Living                      Prior Function            PT Goals (current goals can now be found in the care plan  section) Acute Rehab PT Goals Patient Stated Goal: decrease pain PT Goal Formulation: With patient Time For Goal Achievement: 10/12/17 Potential to Achieve Goals: Good Progress towards PT goals: Progressing toward goals    Frequency    7X/week      PT Plan Current plan remains appropriate    Co-evaluation              AM-PAC PT "6 Clicks" Daily Activity  Outcome Measure  Difficulty  turning over in bed (including adjusting bedclothes, sheets and blankets)?: Unable Difficulty moving from lying on back to sitting on the side of the bed? : Unable Difficulty sitting down on and standing up from a chair with arms (e.g., wheelchair, bedside commode, etc,.)?: Unable Help needed moving to and from a bed to chair (including a wheelchair)?: A Lot Help needed walking in hospital room?: A Lot Help needed climbing 3-5 steps with a railing? : Total 6 Click Score: 8    End of Session Equipment Utilized During Treatment: Gait belt Activity Tolerance: Patient limited by pain Patient left: in chair;with call bell/phone within reach;with chair alarm set Nurse Communication: Mobility status PT Visit Diagnosis: Unsteadiness on feet (R26.81);Muscle weakness (generalized) (M62.81);Pain Pain - Right/Left: Right Pain - part of body: Knee     Time: 1222-1246 PT Time Calculation (min) (ACUTE ONLY): 24 min  Charges:  $Therapeutic Exercise: 8-22 mins $Therapeutic Activity: 8-22 mins                     Lorrin Goodell, PT  Office # [phone] Pager [phone]    Lorriane Shire 10/01/2017, 1:27 PM

## 2017-10-01 NOTE — Clinical Social Work Note (Signed)
Clinical Social Worker facilitated patient discharge including contacting patient family and facility to confirm patient discharge plans.  Clinical information faxed to facility and family agreeable with plan.  CSW arranged ambulance transport via [facility] (3:00 per request of facility) to [facility] (room [address]P, [place]).  RN to call for report prior to discharge.  Clinical Social Worker will sign off for now as social work intervention is no longer needed. Please consult [place] again if new need arises.  [place], [place]

## 2017-10-01 NOTE — Clinical Social Work Note (Signed)
If pt ready for d/c today MD please updated date on d/c summary.  [place], [place]

## 2017-10-01 NOTE — Progress Notes (Signed)
Patient ID: Julia Mcconnell, female   DOB: 1935/12/08, 82 y.o.   MRN: [id number] No acute changes.  She feels good overall.  Right knee stable.  Incisional VAC in place.  Apparently to go to skilled nursing today.

## 2017-10-02 NOTE — Progress Notes (Addendum)
D/c via [facility].  VSS pain addressed and given for transport 6/10.  Wound vac in place without leaks at 125.  Breathing regular and unlabored on Room air. Edema LE improved.  Mobilty throughout shift improved with ambulation to [facility] and in Room with assistance of walker and RN.  Per Social worker Bridget Cobb no portable wound vac available to send with Patient and to send unit vac with patient facilty will return on Monday when portable vac is available. D?C rx and d/c AVS given to paramedics with [facility].  [place] to call report nurse not available to give report at this time.  Will try to contact again.     10-01-17 1945 [facility] nurse had not contacted to get report tried to call facility end of my shift nurse not available per facility.

## 2017-10-03 ENCOUNTER — Telehealth (INDEPENDENT_AMBULATORY_CARE_PROVIDER_SITE_OTHER): Payer: Self-pay | Admitting: Orthopedic Surgery

## 2017-10-03 NOTE — Telephone Encounter (Signed)
[facility]  [facility]  [phone]  Ext [phone]   [place] called from [place] wanted to see if there was any orders for surgical wound vac. Patient was admitted over the weekend [place] would like to know should  wound vac be left on until f/u visit on the 3rd of October?

## 2017-10-04 DIAGNOSIS — D72819 Decreased white blood cell count, unspecified: Secondary | ICD-10-CM | POA: Diagnosis not present

## 2017-10-04 DIAGNOSIS — M25561 Pain in right knee: Secondary | ICD-10-CM | POA: Diagnosis not present

## 2017-10-04 DIAGNOSIS — Z7409 Other reduced mobility: Secondary | ICD-10-CM | POA: Diagnosis not present

## 2017-10-04 DIAGNOSIS — R2689 Other abnormalities of gait and mobility: Secondary | ICD-10-CM | POA: Diagnosis not present

## 2017-10-04 DIAGNOSIS — I10 Essential (primary) hypertension: Secondary | ICD-10-CM | POA: Diagnosis not present

## 2017-10-04 DIAGNOSIS — Z96651 Presence of right artificial knee joint: Secondary | ICD-10-CM | POA: Diagnosis not present

## 2017-10-04 NOTE — Telephone Encounter (Signed)
[place] called back stated she needs clarification specifically on wound VAC She states patient is currently in [place] and was discharged from hospital with a larger digital type VAC even though the discharge summary says she was d/c with the [facility] wound vac. She said the hospital keeps calling her to get the [facility] back because it is equipment that is rented.  She said she also needs clarification because she has 2 different orders-----one that says VAC should be changed Mon,Wed and Fri and one that says to leave [facility] in place until ROV with Dr Sharol Given. Please advise. Thanks.  [place]

## 2017-10-04 NOTE — Telephone Encounter (Signed)
[place] called from [facility] and the hospital is needing the wound vac. [place] would like to know can see remove vac due to various orders.

## 2017-10-04 NOTE — Telephone Encounter (Signed)
IC [place] back, no answer. Left very detailed VM advising per Dr Sharol Given ok to d/c vac at this time and send it back to avoid charges. Advised that per Dr Sharol Given wrap area with 4x4, gauze and ace wrap and to have patient come in next week to see him. Advised could call me back with any further questions.

## 2017-10-04 NOTE — Telephone Encounter (Signed)
Continue vac for a total of 2 weeks from date of surgery, they need to give the vac pump back to [facility] or they will be charged for the unit.

## 2017-10-04 NOTE — Telephone Encounter (Signed)
Please advise thanks.

## 2017-10-06 DIAGNOSIS — M25561 Pain in right knee: Secondary | ICD-10-CM | POA: Diagnosis not present

## 2017-10-06 DIAGNOSIS — R2689 Other abnormalities of gait and mobility: Secondary | ICD-10-CM | POA: Diagnosis not present

## 2017-10-10 DIAGNOSIS — M24561 Contracture, right knee: Secondary | ICD-10-CM | POA: Diagnosis not present

## 2017-10-10 DIAGNOSIS — R2689 Other abnormalities of gait and mobility: Secondary | ICD-10-CM | POA: Diagnosis not present

## 2017-10-10 DIAGNOSIS — M25561 Pain in right knee: Secondary | ICD-10-CM | POA: Diagnosis not present

## 2017-10-13 ENCOUNTER — Encounter (INDEPENDENT_AMBULATORY_CARE_PROVIDER_SITE_OTHER): Payer: Self-pay | Admitting: Orthopedic Surgery

## 2017-10-13 ENCOUNTER — Ambulatory Visit (INDEPENDENT_AMBULATORY_CARE_PROVIDER_SITE_OTHER): Payer: Medicare Other | Admitting: Orthopedic Surgery

## 2017-10-13 DIAGNOSIS — D649 Anemia, unspecified: Secondary | ICD-10-CM | POA: Diagnosis not present

## 2017-10-13 DIAGNOSIS — Z96651 Presence of right artificial knee joint: Secondary | ICD-10-CM | POA: Diagnosis not present

## 2017-10-13 DIAGNOSIS — Z9889 Other specified postprocedural states: Secondary | ICD-10-CM | POA: Diagnosis not present
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
## 2017-10-14 DIAGNOSIS — M24561 Contracture, right knee: Secondary | ICD-10-CM | POA: Diagnosis not present

## 2017-10-14 DIAGNOSIS — R2689 Other abnormalities of gait and mobility: Secondary | ICD-10-CM | POA: Diagnosis not present

## 2017-10-14 DIAGNOSIS — M25561 Pain in right knee: Secondary | ICD-10-CM | POA: Diagnosis not present

## 2017-10-18 DIAGNOSIS — M25561 Pain in right knee: Secondary | ICD-10-CM | POA: Diagnosis not present

## 2017-10-18 DIAGNOSIS — M24561 Contracture, right knee: Secondary | ICD-10-CM | POA: Diagnosis not present

## 2017-10-18 DIAGNOSIS — R2689 Other abnormalities of gait and mobility: Secondary | ICD-10-CM | POA: Diagnosis not present

## 2017-10-20 DIAGNOSIS — M25561 Pain in right knee: Secondary | ICD-10-CM | POA: Diagnosis not present

## 2017-10-20 DIAGNOSIS — R2689 Other abnormalities of gait and mobility: Secondary | ICD-10-CM | POA: Diagnosis not present

## 2017-10-20 DIAGNOSIS — M24561 Contracture, right knee: Secondary | ICD-10-CM | POA: Diagnosis not present

## 2017-10-21 DIAGNOSIS — Z96651 Presence of right artificial knee joint: Secondary | ICD-10-CM | POA: Diagnosis not present

## 2017-10-21 DIAGNOSIS — D649 Anemia, unspecified: Secondary | ICD-10-CM | POA: Diagnosis not present

## 2017-10-21 DIAGNOSIS — K5903 Drug induced constipation: Secondary | ICD-10-CM | POA: Diagnosis not present

## 2017-10-24 DIAGNOSIS — M25561 Pain in right knee: Secondary | ICD-10-CM | POA: Diagnosis not present

## 2017-10-24 DIAGNOSIS — R2689 Other abnormalities of gait and mobility: Secondary | ICD-10-CM | POA: Diagnosis not present

## 2017-10-24 DIAGNOSIS — M24561 Contracture, right knee: Secondary | ICD-10-CM | POA: Diagnosis not present

## 2017-10-26 DIAGNOSIS — M24561 Contracture, right knee: Secondary | ICD-10-CM | POA: Diagnosis not present

## 2017-10-26 DIAGNOSIS — R2689 Other abnormalities of gait and mobility: Secondary | ICD-10-CM | POA: Diagnosis not present

## 2017-10-26 DIAGNOSIS — M25561 Pain in right knee: Secondary | ICD-10-CM | POA: Diagnosis not present

## 2017-10-27 DIAGNOSIS — I10 Essential (primary) hypertension: Secondary | ICD-10-CM | POA: Diagnosis not present

## 2017-10-27 DIAGNOSIS — I351 Nonrheumatic aortic (valve) insufficiency: Secondary | ICD-10-CM | POA: Diagnosis not present

## 2017-10-27 DIAGNOSIS — Z96651 Presence of right artificial knee joint: Secondary | ICD-10-CM | POA: Diagnosis not present

## 2017-10-27 DIAGNOSIS — Z8673 Personal history of transient ischemic attack (TIA), and cerebral infarction without residual deficits: Secondary | ICD-10-CM | POA: Diagnosis not present

## 2017-10-29 DIAGNOSIS — D649 Anemia, unspecified: Secondary | ICD-10-CM | POA: Diagnosis not present

## 2017-10-29 DIAGNOSIS — Z7982 Long term (current) use of aspirin: Secondary | ICD-10-CM | POA: Diagnosis not present

## 2017-10-29 DIAGNOSIS — Z8673 Personal history of transient ischemic attack (TIA), and cerebral infarction without residual deficits: Secondary | ICD-10-CM | POA: Diagnosis not present

## 2017-10-29 DIAGNOSIS — E7800 Pure hypercholesterolemia, unspecified: Secondary | ICD-10-CM | POA: Diagnosis not present

## 2017-10-29 DIAGNOSIS — G4733 Obstructive sleep apnea (adult) (pediatric): Secondary | ICD-10-CM | POA: Diagnosis not present

## 2017-10-29 DIAGNOSIS — Z96651 Presence of right artificial knee joint: Secondary | ICD-10-CM | POA: Diagnosis not present

## 2017-10-29 DIAGNOSIS — I119 Hypertensive heart disease without heart failure: Secondary | ICD-10-CM | POA: Diagnosis not present

## 2017-10-29 DIAGNOSIS — I351 Nonrheumatic aortic (valve) insufficiency: Secondary | ICD-10-CM | POA: Diagnosis not present

## 2017-10-29 DIAGNOSIS — Z471 Aftercare following joint replacement surgery: Secondary | ICD-10-CM | POA: Diagnosis not present

## 2017-11-02 DIAGNOSIS — G4733 Obstructive sleep apnea (adult) (pediatric): Secondary | ICD-10-CM | POA: Diagnosis not present

## 2017-11-02 DIAGNOSIS — I351 Nonrheumatic aortic (valve) insufficiency: Secondary | ICD-10-CM | POA: Diagnosis not present

## 2017-11-02 DIAGNOSIS — D649 Anemia, unspecified: Secondary | ICD-10-CM | POA: Diagnosis not present

## 2017-11-02 DIAGNOSIS — E7800 Pure hypercholesterolemia, unspecified: Secondary | ICD-10-CM | POA: Diagnosis not present

## 2017-11-02 DIAGNOSIS — Z471 Aftercare following joint replacement surgery: Secondary | ICD-10-CM | POA: Diagnosis not present

## 2017-11-02 DIAGNOSIS — I119 Hypertensive heart disease without heart failure: Secondary | ICD-10-CM | POA: Diagnosis not present

## 2017-11-03 ENCOUNTER — Encounter (INDEPENDENT_AMBULATORY_CARE_PROVIDER_SITE_OTHER): Payer: Self-pay | Admitting: Orthopedic Surgery

## 2017-11-03 ENCOUNTER — Ambulatory Visit (INDEPENDENT_AMBULATORY_CARE_PROVIDER_SITE_OTHER): Payer: Medicare Other | Admitting: Orthopedic Surgery

## 2017-11-03 VITALS — Ht <= 58 in | Wt 115.9 lb

## 2017-11-03 DIAGNOSIS — Z96651 Presence of right artificial knee joint: Secondary | ICD-10-CM

## 2017-11-04 ENCOUNTER — Encounter (INDEPENDENT_AMBULATORY_CARE_PROVIDER_SITE_OTHER): Payer: Self-pay | Admitting: Orthopedic Surgery

## 2017-11-04 DIAGNOSIS — G4733 Obstructive sleep apnea (adult) (pediatric): Secondary | ICD-10-CM | POA: Diagnosis not present

## 2017-11-04 DIAGNOSIS — E7800 Pure hypercholesterolemia, unspecified: Secondary | ICD-10-CM | POA: Diagnosis not present

## 2017-11-04 DIAGNOSIS — Z471 Aftercare following joint replacement surgery: Secondary | ICD-10-CM | POA: Diagnosis not present

## 2017-11-04 DIAGNOSIS — D649 Anemia, unspecified: Secondary | ICD-10-CM | POA: Diagnosis not present

## 2017-11-04 DIAGNOSIS — I351 Nonrheumatic aortic (valve) insufficiency: Secondary | ICD-10-CM | POA: Diagnosis not present

## 2017-11-04 DIAGNOSIS — I119 Hypertensive heart disease without heart failure: Secondary | ICD-10-CM | POA: Diagnosis not present
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
## 2017-11-07 ENCOUNTER — Telehealth (INDEPENDENT_AMBULATORY_CARE_PROVIDER_SITE_OTHER): Payer: Self-pay | Admitting: Orthopedic Surgery

## 2017-11-07 DIAGNOSIS — G4733 Obstructive sleep apnea (adult) (pediatric): Secondary | ICD-10-CM | POA: Diagnosis not present

## 2017-11-07 DIAGNOSIS — Z471 Aftercare following joint replacement surgery: Secondary | ICD-10-CM | POA: Diagnosis not present

## 2017-11-07 DIAGNOSIS — I351 Nonrheumatic aortic (valve) insufficiency: Secondary | ICD-10-CM | POA: Diagnosis not present

## 2017-11-07 DIAGNOSIS — E7800 Pure hypercholesterolemia, unspecified: Secondary | ICD-10-CM | POA: Diagnosis not present

## 2017-11-07 DIAGNOSIS — I119 Hypertensive heart disease without heart failure: Secondary | ICD-10-CM | POA: Diagnosis not present

## 2017-11-07 DIAGNOSIS — D649 Anemia, unspecified: Secondary | ICD-10-CM | POA: Diagnosis not present

## 2017-11-07 DIAGNOSIS — D501 Sideropenic dysphagia: Secondary | ICD-10-CM | POA: Diagnosis not present

## 2017-11-07 NOTE — Telephone Encounter (Signed)
Are you in agreement with below suggestion from physical therapy. Please advise.

## 2017-11-07 NOTE — Telephone Encounter (Signed)
Tillie Rung, PT at [facility] home health thinks patient would benefit from being referred to an orthotist due to leg length discrepancy. She would like referral faxed to [facility] so patient could get a raised shoe to even out her legs.  [employer]'s phone # [phone] Fax # [phone]  Tillie Rung would like a call back to see if Dr. Sharol Given agrees with this recommendation [phone]

## 2017-11-08 ENCOUNTER — Other Ambulatory Visit (INDEPENDENT_AMBULATORY_CARE_PROVIDER_SITE_OTHER): Payer: Self-pay

## 2017-11-08 NOTE — Telephone Encounter (Signed)
I called and sw therapist to advise that order written and faxed to [facility] and pt can call and make an appt

## 2017-11-08 NOTE — Telephone Encounter (Signed)
Ok send rx to [facility] for [employer]

## 2017-11-10 DIAGNOSIS — E7800 Pure hypercholesterolemia, unspecified: Secondary | ICD-10-CM | POA: Diagnosis not present

## 2017-11-10 DIAGNOSIS — G4733 Obstructive sleep apnea (adult) (pediatric): Secondary | ICD-10-CM | POA: Diagnosis not present

## 2017-11-10 DIAGNOSIS — I351 Nonrheumatic aortic (valve) insufficiency: Secondary | ICD-10-CM | POA: Diagnosis not present

## 2017-11-10 DIAGNOSIS — D649 Anemia, unspecified: Secondary | ICD-10-CM | POA: Diagnosis not present

## 2017-11-10 DIAGNOSIS — I119 Hypertensive heart disease without heart failure: Secondary | ICD-10-CM | POA: Diagnosis not present

## 2017-11-10 DIAGNOSIS — Z471 Aftercare following joint replacement surgery: Secondary | ICD-10-CM | POA: Diagnosis not present

## 2017-11-14 DIAGNOSIS — I119 Hypertensive heart disease without heart failure: Secondary | ICD-10-CM | POA: Diagnosis not present

## 2017-11-14 DIAGNOSIS — Z471 Aftercare following joint replacement surgery: Secondary | ICD-10-CM | POA: Diagnosis not present

## 2017-11-14 DIAGNOSIS — D649 Anemia, unspecified: Secondary | ICD-10-CM | POA: Diagnosis not present

## 2017-11-14 DIAGNOSIS — E7800 Pure hypercholesterolemia, unspecified: Secondary | ICD-10-CM | POA: Diagnosis not present

## 2017-11-14 DIAGNOSIS — I351 Nonrheumatic aortic (valve) insufficiency: Secondary | ICD-10-CM | POA: Diagnosis not present

## 2017-11-14 DIAGNOSIS — G4733 Obstructive sleep apnea (adult) (pediatric): Secondary | ICD-10-CM | POA: Diagnosis not present

## 2017-11-17 DIAGNOSIS — G8929 Other chronic pain: Secondary | ICD-10-CM | POA: Diagnosis not present

## 2017-11-17 DIAGNOSIS — T84621D Infection and inflammatory reaction due to internal fixation device of left femur, subsequent encounter: Secondary | ICD-10-CM | POA: Diagnosis not present

## 2017-11-17 DIAGNOSIS — I10 Essential (primary) hypertension: Secondary | ICD-10-CM | POA: Diagnosis not present

## 2017-11-17 DIAGNOSIS — M545 Low back pain: Secondary | ICD-10-CM | POA: Diagnosis not present

## 2017-11-18 DIAGNOSIS — I119 Hypertensive heart disease without heart failure: Secondary | ICD-10-CM | POA: Diagnosis not present

## 2017-11-18 DIAGNOSIS — I351 Nonrheumatic aortic (valve) insufficiency: Secondary | ICD-10-CM | POA: Diagnosis not present

## 2017-11-18 DIAGNOSIS — D649 Anemia, unspecified: Secondary | ICD-10-CM | POA: Diagnosis not present

## 2017-11-18 DIAGNOSIS — Z471 Aftercare following joint replacement surgery: Secondary | ICD-10-CM | POA: Diagnosis not present

## 2017-11-18 DIAGNOSIS — G4733 Obstructive sleep apnea (adult) (pediatric): Secondary | ICD-10-CM | POA: Diagnosis not present

## 2017-11-18 DIAGNOSIS — E7800 Pure hypercholesterolemia, unspecified: Secondary | ICD-10-CM | POA: Diagnosis not present

## 2017-11-21 DIAGNOSIS — Z471 Aftercare following joint replacement surgery: Secondary | ICD-10-CM | POA: Diagnosis not present

## 2017-11-21 DIAGNOSIS — I119 Hypertensive heart disease without heart failure: Secondary | ICD-10-CM | POA: Diagnosis not present

## 2017-11-21 DIAGNOSIS — D649 Anemia, unspecified: Secondary | ICD-10-CM | POA: Diagnosis not present

## 2017-11-21 DIAGNOSIS — E7800 Pure hypercholesterolemia, unspecified: Secondary | ICD-10-CM | POA: Diagnosis not present

## 2017-11-21 DIAGNOSIS — I351 Nonrheumatic aortic (valve) insufficiency: Secondary | ICD-10-CM | POA: Diagnosis not present

## 2017-11-21 DIAGNOSIS — G4733 Obstructive sleep apnea (adult) (pediatric): Secondary | ICD-10-CM | POA: Diagnosis not present

## 2017-11-22 DIAGNOSIS — D509 Iron deficiency anemia, unspecified: Secondary | ICD-10-CM | POA: Diagnosis not present

## 2017-11-24 DIAGNOSIS — I351 Nonrheumatic aortic (valve) insufficiency: Secondary | ICD-10-CM | POA: Diagnosis not present

## 2017-11-24 DIAGNOSIS — G4733 Obstructive sleep apnea (adult) (pediatric): Secondary | ICD-10-CM | POA: Diagnosis not present

## 2017-11-24 DIAGNOSIS — E7800 Pure hypercholesterolemia, unspecified: Secondary | ICD-10-CM | POA: Diagnosis not present

## 2017-11-24 DIAGNOSIS — Z471 Aftercare following joint replacement surgery: Secondary | ICD-10-CM | POA: Diagnosis not present

## 2017-11-24 DIAGNOSIS — I119 Hypertensive heart disease without heart failure: Secondary | ICD-10-CM | POA: Diagnosis not present

## 2017-11-24 DIAGNOSIS — D649 Anemia, unspecified: Secondary | ICD-10-CM | POA: Diagnosis not present

## 2017-11-29 DIAGNOSIS — R197 Diarrhea, unspecified: Secondary | ICD-10-CM | POA: Diagnosis not present

## 2017-12-01 ENCOUNTER — Ambulatory Visit (INDEPENDENT_AMBULATORY_CARE_PROVIDER_SITE_OTHER): Payer: Medicare Other | Admitting: Orthopedic Surgery

## 2017-12-01 ENCOUNTER — Ambulatory Visit (INDEPENDENT_AMBULATORY_CARE_PROVIDER_SITE_OTHER): Payer: Medicare Other

## 2017-12-01 ENCOUNTER — Encounter (INDEPENDENT_AMBULATORY_CARE_PROVIDER_SITE_OTHER): Payer: Self-pay | Admitting: Orthopedic Surgery

## 2017-12-01 VITALS — Ht <= 58 in | Wt 114.0 lb

## 2017-12-01 DIAGNOSIS — I87331 Chronic venous hypertension (idiopathic) with ulcer and inflammation of right lower extremity: Secondary | ICD-10-CM

## 2017-12-01 DIAGNOSIS — Z96651 Presence of right artificial knee joint: Secondary | ICD-10-CM | POA: Diagnosis not present

## 2017-12-01 DIAGNOSIS — M24661 Ankylosis, right knee: Secondary | ICD-10-CM

## 2017-12-01 DIAGNOSIS — L97919 Non-pressure chronic ulcer of unspecified part of right lower leg with unspecified severity: Secondary | ICD-10-CM
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
## 2017-12-14 DIAGNOSIS — Z471 Aftercare following joint replacement surgery: Secondary | ICD-10-CM | POA: Diagnosis not present

## 2017-12-14 DIAGNOSIS — I351 Nonrheumatic aortic (valve) insufficiency: Secondary | ICD-10-CM | POA: Diagnosis not present

## 2017-12-14 DIAGNOSIS — I119 Hypertensive heart disease without heart failure: Secondary | ICD-10-CM | POA: Diagnosis not present

## 2017-12-14 DIAGNOSIS — D649 Anemia, unspecified: Secondary | ICD-10-CM | POA: Diagnosis not present

## 2017-12-22 DIAGNOSIS — D509 Iron deficiency anemia, unspecified: Secondary | ICD-10-CM | POA: Diagnosis not present

## 2017-12-29 ENCOUNTER — Encounter (INDEPENDENT_AMBULATORY_CARE_PROVIDER_SITE_OTHER): Payer: Self-pay | Admitting: Orthopedic Surgery

## 2017-12-29 ENCOUNTER — Ambulatory Visit (INDEPENDENT_AMBULATORY_CARE_PROVIDER_SITE_OTHER): Payer: Medicare Other | Admitting: Orthopedic Surgery

## 2017-12-29 ENCOUNTER — Ambulatory Visit (INDEPENDENT_AMBULATORY_CARE_PROVIDER_SITE_OTHER): Payer: Self-pay

## 2017-12-29 VITALS — Ht <= 58 in | Wt 114.0 lb

## 2017-12-29 DIAGNOSIS — G8929 Other chronic pain: Secondary | ICD-10-CM

## 2017-12-29 DIAGNOSIS — M5442 Lumbago with sciatica, left side: Secondary | ICD-10-CM

## 2017-12-29 DIAGNOSIS — R29898 Other symptoms and signs involving the musculoskeletal system: Secondary | ICD-10-CM

## 2017-12-29 MED ORDER — PREDNISONE 10 MG PO TABS: 20.0000 mg | ORAL_TABLET | Freq: Every day | ORAL | 0 refills | Status: DC
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
## 2018-01-05 DIAGNOSIS — H35372 Puckering of macula, left eye: Secondary | ICD-10-CM | POA: Diagnosis not present

## 2018-01-05 DIAGNOSIS — H35363 Drusen (degenerative) of macula, bilateral: Secondary | ICD-10-CM | POA: Diagnosis not present

## 2018-01-05 DIAGNOSIS — H35033 Hypertensive retinopathy, bilateral: Secondary | ICD-10-CM | POA: Diagnosis not present

## 2018-01-05 DIAGNOSIS — H40023 Open angle with borderline findings, high risk, bilateral: Secondary | ICD-10-CM | POA: Diagnosis not present

## 2018-01-08 ENCOUNTER — Other Ambulatory Visit: Payer: Medicare Other

## 2018-01-15 ENCOUNTER — Ambulatory Visit
Admission: RE | Admit: 2018-01-15 | Discharge: 2018-01-15 | Disposition: A | Discharge status: Home / Self Care | Payer: Medicare Other | Source: Ambulatory Visit | Attending: Orthopedic Surgery | Admitting: Orthopedic Surgery

## 2018-01-15 DIAGNOSIS — M5442 Lumbago with sciatica, left side: Principal | ICD-10-CM

## 2018-01-15 DIAGNOSIS — G8929 Other chronic pain: Secondary | ICD-10-CM

## 2018-01-15 DIAGNOSIS — M47816 Spondylosis without myelopathy or radiculopathy, lumbar region: Secondary | ICD-10-CM | POA: Diagnosis not present

## 2018-01-15 DIAGNOSIS — M5126 Other intervertebral disc displacement, lumbar region: Secondary | ICD-10-CM | POA: Diagnosis not present

## 2018-01-15 DIAGNOSIS — M47817 Spondylosis without myelopathy or radiculopathy, lumbosacral region: Secondary | ICD-10-CM | POA: Diagnosis not present

## 2018-01-17 ENCOUNTER — Telehealth (INDEPENDENT_AMBULATORY_CARE_PROVIDER_SITE_OTHER): Payer: Self-pay | Admitting: *Deleted

## 2018-01-17 ENCOUNTER — Other Ambulatory Visit (INDEPENDENT_AMBULATORY_CARE_PROVIDER_SITE_OTHER): Payer: Self-pay

## 2018-01-17 DIAGNOSIS — I119 Hypertensive heart disease without heart failure: Secondary | ICD-10-CM | POA: Diagnosis not present

## 2018-01-17 DIAGNOSIS — I351 Nonrheumatic aortic (valve) insufficiency: Secondary | ICD-10-CM | POA: Diagnosis not present

## 2018-01-17 DIAGNOSIS — G8929 Other chronic pain: Secondary | ICD-10-CM

## 2018-01-17 DIAGNOSIS — D649 Anemia, unspecified: Secondary | ICD-10-CM | POA: Diagnosis not present

## 2018-01-17 DIAGNOSIS — M5442 Lumbago with sciatica, left side: Principal | ICD-10-CM

## 2018-01-17 DIAGNOSIS — Z471 Aftercare following joint replacement surgery: Secondary | ICD-10-CM | POA: Diagnosis not present

## 2018-01-25 ENCOUNTER — Telehealth (INDEPENDENT_AMBULATORY_CARE_PROVIDER_SITE_OTHER): Payer: Self-pay | Admitting: Orthopedic Surgery

## 2018-01-25 ENCOUNTER — Telehealth (INDEPENDENT_AMBULATORY_CARE_PROVIDER_SITE_OTHER): Payer: Self-pay | Admitting: *Deleted

## 2018-01-25 NOTE — Telephone Encounter (Deleted)
Pt is needing an epidural steroid injection by Dr. Ernestina Patches at [facility] Pt needs to hold Plavix 7 days prior to injection. Ok to hold? Please Advise.

## 2018-01-25 NOTE — Telephone Encounter (Signed)
Routed to [employer], [place]

## 2018-01-25 NOTE — Telephone Encounter (Signed)
Spoke with patient yesterday (01/24/2018) and today (01/25/2018) and informed her that I have sent a message to the doctor that prescribed her plavix to ask permission to hold it prior to inj. Still waiting for a response for permission.

## 2018-01-25 NOTE — Telephone Encounter (Signed)
°  Patient called wanting to know how long should she be off her blood thinner before her appt w/Dr.[place].  Please call patient asap.  No appt has been made yet but she needs to know. [phone]

## 2018-01-25 NOTE — Telephone Encounter (Signed)
Okay to hold Plavix for her injection

## 2018-01-25 NOTE — Telephone Encounter (Signed)
Can you please call pt? See below.

## 2018-01-25 NOTE — Telephone Encounter (Signed)
error 

## 2018-01-26 NOTE — Telephone Encounter (Signed)
Called pt and adivsed per Dr. Lorie Phenix to hold plavix 7 days prior to inj on 02/09/2018.

## 2018-01-30 DIAGNOSIS — R410 Disorientation, unspecified: Secondary | ICD-10-CM | POA: Diagnosis not present

## 2018-01-30 DIAGNOSIS — I10 Essential (primary) hypertension: Secondary | ICD-10-CM | POA: Diagnosis not present

## 2018-01-30 DIAGNOSIS — R300 Dysuria: Secondary | ICD-10-CM | POA: Diagnosis not present

## 2018-02-02 DIAGNOSIS — L304 Erythema intertrigo: Secondary | ICD-10-CM | POA: Diagnosis not present

## 2018-02-02 DIAGNOSIS — I10 Essential (primary) hypertension: Secondary | ICD-10-CM | POA: Diagnosis not present

## 2018-02-02 DIAGNOSIS — E871 Hypo-osmolality and hyponatremia: Secondary | ICD-10-CM | POA: Diagnosis not present

## 2018-02-02 DIAGNOSIS — R600 Localized edema: Secondary | ICD-10-CM | POA: Diagnosis not present

## 2018-02-02 DIAGNOSIS — D72819 Decreased white blood cell count, unspecified: Secondary | ICD-10-CM | POA: Diagnosis not present

## 2018-02-02 DIAGNOSIS — R410 Disorientation, unspecified: Secondary | ICD-10-CM | POA: Diagnosis not present

## 2018-02-03 DIAGNOSIS — E871 Hypo-osmolality and hyponatremia: Secondary | ICD-10-CM | POA: Diagnosis not present

## 2018-02-03 DIAGNOSIS — I10 Essential (primary) hypertension: Secondary | ICD-10-CM | POA: Diagnosis not present

## 2018-02-06 ENCOUNTER — Other Ambulatory Visit: Payer: Self-pay

## 2018-02-06 ENCOUNTER — Encounter (HOSPITAL_COMMUNITY): Payer: Self-pay

## 2018-02-06 ENCOUNTER — Emergency Department (HOSPITAL_COMMUNITY): Payer: Medicare Other

## 2018-02-06 ENCOUNTER — Emergency Department (HOSPITAL_COMMUNITY)
Admission: EM | Admit: 2018-02-06 | Discharge: 2018-02-06 | Disposition: A | Discharge status: Home / Self Care | Payer: Medicare Other | Attending: Emergency Medicine | Admitting: Emergency Medicine

## 2018-02-06 DIAGNOSIS — R4182 Altered mental status, unspecified: Secondary | ICD-10-CM | POA: Diagnosis not present

## 2018-02-06 DIAGNOSIS — Z79899 Other long term (current) drug therapy: Secondary | ICD-10-CM | POA: Diagnosis not present

## 2018-02-06 DIAGNOSIS — I10 Essential (primary) hypertension: Secondary | ICD-10-CM | POA: Insufficient documentation

## 2018-02-06 DIAGNOSIS — R410 Disorientation, unspecified: Secondary | ICD-10-CM | POA: Diagnosis not present

## 2018-02-06 DIAGNOSIS — R0902 Hypoxemia: Secondary | ICD-10-CM | POA: Diagnosis not present

## 2018-02-06 LAB — CBC WITH DIFFERENTIAL/PLATELET
Abs Immature Granulocytes: 0.01 10*3/uL (ref 0.00–0.07)
BASOS PCT: 0 %
Basophils Absolute: 0 10*3/uL (ref 0.0–0.1)
Eosinophils Absolute: 0.1 10*3/uL (ref 0.0–0.5)
Eosinophils Relative: 2 %
HCT: 39.6 % (ref 36.0–46.0)
Hemoglobin: 12.8 g/dL (ref 12.0–15.0)
Immature Granulocytes: 0 %
Lymphocytes Relative: 11 %
Lymphs Abs: 0.6 10*3/uL — ABNORMAL LOW (ref 0.7–4.0)
MCH: 30.6 pg (ref 26.0–34.0)
MCHC: 32.3 g/dL (ref 30.0–36.0)
MCV: 94.7 fL (ref 80.0–100.0)
Monocytes Absolute: 0.4 10*3/uL (ref 0.1–1.0)
Monocytes Relative: 8 %
Neutro Abs: 4.4 10*3/uL (ref 1.7–7.7)
Neutrophils Relative %: 79 %
Platelets: 225 10*3/uL (ref 150–400)
RBC: 4.18 MIL/uL (ref 3.87–5.11)
RDW: 15.7 % — ABNORMAL HIGH (ref 11.5–15.5)
WBC: 5.5 10*3/uL (ref 4.0–10.5)
nRBC: 0 % (ref 0.0–0.2)

## 2018-02-06 LAB — COMPREHENSIVE METABOLIC PANEL
ALT: 21 U/L (ref 0–44)
AST: 21 U/L (ref 15–41)
Albumin: 3.6 g/dL (ref 3.5–5.0)
Alkaline Phosphatase: 76 U/L (ref 38–126)
Anion gap: 12 (ref 5–15)
BUN: 11 mg/dL (ref 8–23)
CO2: 26 mmol/L (ref 22–32)
CREATININE: 0.62 mg/dL (ref 0.44–1.00)
Calcium: 9.3 mg/dL (ref 8.9–10.3)
Chloride: 94 mmol/L — ABNORMAL LOW (ref 98–111)
GFR calc Af Amer: >60 mL/min (ref >60)
Glucose, Bld: 125 mg/dL — ABNORMAL HIGH (ref 70–99)
Potassium: 4.5 mmol/L (ref 3.5–5.1)
Sodium: 132 mmol/L — ABNORMAL LOW (ref 135–145)
Total Bilirubin: 0.6 mg/dL (ref 0.3–1.2)
Total Protein: 7 g/dL (ref 6.5–8.1)

## 2018-02-06 LAB — URINALYSIS, ROUTINE W REFLEX MICROSCOPIC
Bacteria, UA: NONE SEEN
Bilirubin Urine: NEGATIVE
GLUCOSE, UA: NEGATIVE mg/dL
Hgb urine dipstick: NEGATIVE
Ketones, ur: NEGATIVE mg/dL
Leukocytes, UA: NEGATIVE
Nitrite: NEGATIVE
Protein, ur: 100 mg/dL — AB
Specific Gravity, Urine: 1.006 (ref 1.005–1.030)
pH: 8 (ref 5.0–8.0)
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
## 2018-02-06 NOTE — ED Provider Notes (Signed)
4:56 PM Assumed care from Dr. Ralene Bathe, please see their note for full history, physical and decision making until this point. In brief this is a 83 y.o. year old female who presented to the ED tonight with Altered Mental Status     Intermittent confusion likely related to waxing/waning delirium. Pending MRI. Likely discharge.   Discharge instructions, including strict return precautions for new or worsening symptoms, given. Patient and/or family verbalized understanding and agreement with the plan as described.   Labs, studies and imaging reviewed by myself and considered in medical decision making if ordered. Imaging interpreted by radiology.  Labs Reviewed  COMPREHENSIVE METABOLIC PANEL - Abnormal; Notable for the following components:      Result Value   Sodium 132 (*)    Chloride 94 (*)    Glucose, Bld 125 (*)    All other components within normal limits  CBC WITH DIFFERENTIAL/PLATELET - Abnormal; Notable for the following components:   RDW 15.7 (*)    Lymphs Abs 0.6 (*)    All other components within normal limits  URINALYSIS, ROUTINE W REFLEX MICROSCOPIC - Abnormal; Notable for the following components:   Color, Urine STRAW (*)    Protein, ur 100 (*)    All other components within normal limits  URINE CULTURE    CT Head Wo Contrast  Final Result    MR BRAIN WO CONTRAST    (Results Pending)    No follow-ups on file.    Xavious Sharrar, Corene Cornea, MD 02/08/18 [phone]

## 2018-02-06 NOTE — ED Triage Notes (Signed)
[facility]- pt coming from home she had an episode of confusion today and was referred here by her PCP. Pt now alert and oriented to her baseline.

## 2018-02-06 NOTE — ED Notes (Signed)
Pt verbalized understanding of discharge paperwork and follow-up care.  °

## 2018-02-07 LAB — URINE CULTURE

## 2018-02-08 DIAGNOSIS — R413 Other amnesia: Secondary | ICD-10-CM | POA: Diagnosis not present

## 2018-02-08 DIAGNOSIS — B373 Candidiasis of vulva and vagina: Secondary | ICD-10-CM | POA: Diagnosis not present

## 2018-02-08 DIAGNOSIS — I10 Essential (primary) hypertension: Secondary | ICD-10-CM | POA: Diagnosis not present

## 2018-02-08 DIAGNOSIS — R410 Disorientation, unspecified: Secondary | ICD-10-CM | POA: Diagnosis not present

## 2018-02-08 DIAGNOSIS — M810 Age-related osteoporosis without current pathological fracture: Secondary | ICD-10-CM | POA: Diagnosis not present

## 2018-02-08 DIAGNOSIS — D649 Anemia, unspecified: Secondary | ICD-10-CM | POA: Diagnosis not present

## 2018-02-08 DIAGNOSIS — D509 Iron deficiency anemia, unspecified: Secondary | ICD-10-CM | POA: Diagnosis not present

## 2018-02-09 ENCOUNTER — Ambulatory Visit (INDEPENDENT_AMBULATORY_CARE_PROVIDER_SITE_OTHER): Payer: Medicare Other | Admitting: Physical Medicine and Rehabilitation

## 2018-02-09 ENCOUNTER — Ambulatory Visit (INDEPENDENT_AMBULATORY_CARE_PROVIDER_SITE_OTHER): Payer: Self-pay

## 2018-02-09 VITALS — BP 141/59 | HR 68 | Temp 97.6°F

## 2018-02-09 DIAGNOSIS — M48061 Spinal stenosis, lumbar region without neurogenic claudication: Secondary | ICD-10-CM | POA: Diagnosis not present

## 2018-02-09 DIAGNOSIS — M4316 Spondylolisthesis, lumbar region: Secondary | ICD-10-CM | POA: Diagnosis not present

## 2018-02-09 DIAGNOSIS — M5416 Radiculopathy, lumbar region: Secondary | ICD-10-CM | POA: Diagnosis not present

## 2018-02-09 MED ORDER — METHYLPREDNISOLONE ACETATE 80 MG/ML IJ SUSP
80.0000 mg | Freq: Once | INTRAMUSCULAR | Status: AC
  Administered 2018-02-09: 80 mg

## 2018-02-09 NOTE — Progress Notes (Signed)
 .  Numeric Pain Rating Scale and Functional Assessment Average Pain 7   In the last MONTH (on 0-10 scale) has pain interfered with the following?  1. General activity like being  able to carry out your everyday physical activities such as walking, climbing stairs, carrying groceries, or moving a chair?  Rating(6)   +Driver, +BT(stopped plavix 02/02/2018) , -Dye Allergies.

## 2018-02-14 ENCOUNTER — Encounter: Payer: Self-pay | Admitting: Neurology

## 2018-02-14 ENCOUNTER — Other Ambulatory Visit: Payer: Self-pay

## 2018-02-14 ENCOUNTER — Ambulatory Visit (INDEPENDENT_AMBULATORY_CARE_PROVIDER_SITE_OTHER): Payer: Medicare Other | Admitting: Neurology

## 2018-02-14 VITALS — BP 165/65 | HR 83 | Resp 18 | Ht <= 58 in | Wt 118.0 lb

## 2018-02-14 DIAGNOSIS — R410 Disorientation, unspecified: Secondary | ICD-10-CM
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
## 2018-02-15 ENCOUNTER — Encounter: Payer: Self-pay | Admitting: Neurology

## 2018-02-15 ENCOUNTER — Telehealth: Payer: Self-pay | Admitting: Neurology

## 2018-02-15 DIAGNOSIS — M810 Age-related osteoporosis without current pathological fracture: Secondary | ICD-10-CM | POA: Diagnosis not present

## 2018-02-15 LAB — TSH: TSH: 2.12 u[IU]/mL (ref 0.450–4.500)

## 2018-02-15 LAB — VITAMIN B12: Vitamin B-12: 310 pg/mL (ref 232–1245)

## 2018-02-15 LAB — SEDIMENTATION RATE: Sed Rate: 72 mm/hr — ABNORMAL HIGH (ref 0–40)

## 2018-02-15 LAB — C-REACTIVE PROTEIN: CRP: <1 mg/L (ref 0–10)

## 2018-02-15 NOTE — Telephone Encounter (Signed)
Please call patient, laboratory evaluation showed elevated ESR 72, with normal C-reactive protein, above findings has an unknown clinical significance, please check with patient if she has any significant headache, or any signs of infection, if not, we may consider repeat ESR C-reactive protein at next follow-up visit, if she has any signs of infection, she should contact her primary care to evaluate  Rest of the laboratory evaluation showed no significant abnormality.

## 2018-02-15 NOTE — Telephone Encounter (Signed)
Spoke with Ms. Boquet and reviewed below lab results.  She verbalized understanding of same and denied h/a, other signs of infection such as cough/cold, UTI sx./fim

## 2018-02-21 NOTE — Procedures (Signed)
Lumbar Epidural Steroid Injection - Interlaminar Approach with Fluoroscopic Guidance  Patient: Julia Mcconnell      Date of Birth: 1935-05-03 MRN: [id number] PCP: Deland Pretty, MD      Visit Date: 02/09/2018   Universal Protocol:     Consent Given By: the patient  Position: PRONE  Additional Comments: Vital signs were monitored before and after the procedure. Patient was prepped and draped in the usual sterile fashion. The correct patient, procedure, and site was verified.   Injection Procedure Details:  Procedure Site One Meds Administered:  Meds ordered this encounter  Medications  . methylPREDNISolone acetate (DEPO-MEDROL) injection 80 mg     Laterality: Right  Location/Site:  L4-L5  Needle size: 20 G  Needle type: Tuohy  Needle Placement: Paramedian epidural  Findings:   -Comments: Excellent flow of contrast into the epidural space.  Procedure Details: Using a paramedian approach from the side mentioned above, the region overlying the inferior lamina was localized under fluoroscopic visualization and the soft tissues overlying this structure were infiltrated with 4 ml. of 1% Lidocaine without Epinephrine. The Tuohy needle was inserted into the epidural space using a paramedian approach.   The epidural space was localized using loss of resistance along with lateral and bi-planar fluoroscopic views.  After negative aspirate for air, blood, and CSF, a 2 ml. volume of Isovue-250 was injected into the epidural space and the flow of contrast was observed. Radiographs were obtained for documentation purposes.    The injectate was administered into the level noted above.   Additional Comments:  The patient tolerated the procedure well Dressing: Band-Aid    Post-procedure details: Patient was observed during the procedure. Post-procedure instructions were reviewed.  Patient left the clinic in stable condition.
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
## 2018-02-23 DIAGNOSIS — G4700 Insomnia, unspecified: Secondary | ICD-10-CM | POA: Diagnosis not present

## 2018-02-23 DIAGNOSIS — E871 Hypo-osmolality and hyponatremia: Secondary | ICD-10-CM | POA: Diagnosis not present

## 2018-02-23 DIAGNOSIS — I10 Essential (primary) hypertension: Secondary | ICD-10-CM | POA: Diagnosis not present

## 2018-02-23 DIAGNOSIS — R410 Disorientation, unspecified: Secondary | ICD-10-CM | POA: Diagnosis not present

## 2018-03-02 DIAGNOSIS — H6123 Impacted cerumen, bilateral: Secondary | ICD-10-CM | POA: Diagnosis not present

## 2018-03-02 DIAGNOSIS — I10 Essential (primary) hypertension: Secondary | ICD-10-CM | POA: Diagnosis not present

## 2018-03-09 DIAGNOSIS — R635 Abnormal weight gain: Secondary | ICD-10-CM | POA: Diagnosis not present

## 2018-03-23 DIAGNOSIS — I10 Essential (primary) hypertension: Secondary | ICD-10-CM | POA: Diagnosis not present

## 2018-03-28 ENCOUNTER — Institutional Professional Consult (permissible substitution): Payer: Medicare Other | Admitting: Neurology

## 2018-04-04 DIAGNOSIS — R600 Localized edema: Secondary | ICD-10-CM | POA: Diagnosis not present

## 2018-04-04 DIAGNOSIS — R220 Localized swelling, mass and lump, head: Secondary | ICD-10-CM | POA: Diagnosis not present

## 2018-04-04 DIAGNOSIS — I10 Essential (primary) hypertension: Secondary | ICD-10-CM | POA: Diagnosis not present

## 2018-04-27 ENCOUNTER — Telehealth (INDEPENDENT_AMBULATORY_CARE_PROVIDER_SITE_OTHER): Payer: Self-pay | Admitting: Physical Medicine and Rehabilitation

## 2018-04-27 NOTE — Telephone Encounter (Signed)
Yes ok, need to be off plavix - if it was ok then likley ok now but could go ahead try to get auth for plavix

## 2018-05-03 ENCOUNTER — Telehealth (INDEPENDENT_AMBULATORY_CARE_PROVIDER_SITE_OTHER): Payer: Self-pay | Admitting: Physical Medicine and Rehabilitation

## 2018-05-03 NOTE — Telephone Encounter (Signed)
Okay to hold antiplatelet drugs for epidural injection

## 2018-05-03 NOTE — Telephone Encounter (Signed)
Message sent requesting ok to hold Plavix x7 days.

## 2018-05-04 DIAGNOSIS — I10 Essential (primary) hypertension: Secondary | ICD-10-CM | POA: Diagnosis not present

## 2018-05-04 DIAGNOSIS — R600 Localized edema: Secondary | ICD-10-CM | POA: Diagnosis not present

## 2018-05-04 DIAGNOSIS — K130 Diseases of lips: Secondary | ICD-10-CM | POA: Diagnosis not present

## 2018-05-05 NOTE — Telephone Encounter (Signed)
Scheduled for 5/26 at 1300.

## 2018-05-11 DIAGNOSIS — R600 Localized edema: Secondary | ICD-10-CM | POA: Diagnosis not present

## 2018-05-15 ENCOUNTER — Encounter: Payer: Self-pay | Admitting: Orthopedic Surgery

## 2018-05-15 ENCOUNTER — Ambulatory Visit: Payer: Self-pay

## 2018-05-15 ENCOUNTER — Other Ambulatory Visit: Payer: Self-pay

## 2018-05-15 ENCOUNTER — Ambulatory Visit (INDEPENDENT_AMBULATORY_CARE_PROVIDER_SITE_OTHER): Payer: Medicare Other | Admitting: Orthopedic Surgery

## 2018-05-15 VITALS — Ht <= 58 in | Wt 118.0 lb

## 2018-05-15 DIAGNOSIS — Z96651 Presence of right artificial knee joint: Secondary | ICD-10-CM
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
## 2018-05-16 ENCOUNTER — Encounter: Payer: Self-pay | Admitting: Orthopedic Surgery

## 2018-05-17 DIAGNOSIS — Z96651 Presence of right artificial knee joint: Secondary | ICD-10-CM | POA: Diagnosis not present

## 2018-05-17 DIAGNOSIS — G4733 Obstructive sleep apnea (adult) (pediatric): Secondary | ICD-10-CM | POA: Diagnosis not present

## 2018-05-17 DIAGNOSIS — M810 Age-related osteoporosis without current pathological fracture: Secondary | ICD-10-CM | POA: Diagnosis not present

## 2018-05-17 DIAGNOSIS — R41841 Cognitive communication deficit: Secondary | ICD-10-CM | POA: Diagnosis not present

## 2018-05-17 DIAGNOSIS — M6281 Muscle weakness (generalized): Secondary | ICD-10-CM | POA: Diagnosis not present

## 2018-05-17 DIAGNOSIS — Z9989 Dependence on other enabling machines and devices: Secondary | ICD-10-CM | POA: Diagnosis not present

## 2018-05-17 DIAGNOSIS — R2689 Other abnormalities of gait and mobility: Secondary | ICD-10-CM | POA: Diagnosis not present

## 2018-05-17 DIAGNOSIS — M24661 Ankylosis, right knee: Secondary | ICD-10-CM | POA: Diagnosis not present

## 2018-05-17 DIAGNOSIS — F339 Major depressive disorder, recurrent, unspecified: Secondary | ICD-10-CM | POA: Diagnosis not present

## 2018-05-17 DIAGNOSIS — I872 Venous insufficiency (chronic) (peripheral): Secondary | ICD-10-CM | POA: Diagnosis not present

## 2018-05-19 DIAGNOSIS — R600 Localized edema: Secondary | ICD-10-CM | POA: Diagnosis not present

## 2018-05-22 DIAGNOSIS — M810 Age-related osteoporosis without current pathological fracture: Secondary | ICD-10-CM | POA: Diagnosis not present

## 2018-05-22 DIAGNOSIS — G4733 Obstructive sleep apnea (adult) (pediatric): Secondary | ICD-10-CM | POA: Diagnosis not present

## 2018-05-22 DIAGNOSIS — I872 Venous insufficiency (chronic) (peripheral): Secondary | ICD-10-CM | POA: Diagnosis not present

## 2018-05-22 DIAGNOSIS — F339 Major depressive disorder, recurrent, unspecified: Secondary | ICD-10-CM | POA: Diagnosis not present

## 2018-05-22 DIAGNOSIS — Z9989 Dependence on other enabling machines and devices: Secondary | ICD-10-CM | POA: Diagnosis not present

## 2018-05-22 DIAGNOSIS — M24661 Ankylosis, right knee: Secondary | ICD-10-CM | POA: Diagnosis not present

## 2018-05-24 DIAGNOSIS — I872 Venous insufficiency (chronic) (peripheral): Secondary | ICD-10-CM | POA: Diagnosis not present

## 2018-05-24 DIAGNOSIS — M24661 Ankylosis, right knee: Secondary | ICD-10-CM | POA: Diagnosis not present

## 2018-05-24 DIAGNOSIS — G4733 Obstructive sleep apnea (adult) (pediatric): Secondary | ICD-10-CM | POA: Diagnosis not present

## 2018-05-24 DIAGNOSIS — Z9989 Dependence on other enabling machines and devices: Secondary | ICD-10-CM | POA: Diagnosis not present

## 2018-05-24 DIAGNOSIS — F339 Major depressive disorder, recurrent, unspecified: Secondary | ICD-10-CM | POA: Diagnosis not present

## 2018-05-24 DIAGNOSIS — M810 Age-related osteoporosis without current pathological fracture: Secondary | ICD-10-CM | POA: Diagnosis not present

## 2018-05-29 DIAGNOSIS — M810 Age-related osteoporosis without current pathological fracture: Secondary | ICD-10-CM | POA: Diagnosis not present

## 2018-05-29 DIAGNOSIS — Z9989 Dependence on other enabling machines and devices: Secondary | ICD-10-CM | POA: Diagnosis not present

## 2018-05-29 DIAGNOSIS — M24661 Ankylosis, right knee: Secondary | ICD-10-CM | POA: Diagnosis not present

## 2018-05-29 DIAGNOSIS — G4733 Obstructive sleep apnea (adult) (pediatric): Secondary | ICD-10-CM | POA: Diagnosis not present

## 2018-05-29 DIAGNOSIS — F339 Major depressive disorder, recurrent, unspecified: Secondary | ICD-10-CM | POA: Diagnosis not present

## 2018-05-29 DIAGNOSIS — I872 Venous insufficiency (chronic) (peripheral): Secondary | ICD-10-CM | POA: Diagnosis not present

## 2018-05-30 DIAGNOSIS — R600 Localized edema: Secondary | ICD-10-CM | POA: Diagnosis not present

## 2018-05-31 DIAGNOSIS — I872 Venous insufficiency (chronic) (peripheral): Secondary | ICD-10-CM | POA: Diagnosis not present

## 2018-05-31 DIAGNOSIS — M24661 Ankylosis, right knee: Secondary | ICD-10-CM | POA: Diagnosis not present

## 2018-05-31 DIAGNOSIS — G4733 Obstructive sleep apnea (adult) (pediatric): Secondary | ICD-10-CM | POA: Diagnosis not present

## 2018-05-31 DIAGNOSIS — Z9989 Dependence on other enabling machines and devices: Secondary | ICD-10-CM | POA: Diagnosis not present

## 2018-05-31 DIAGNOSIS — F339 Major depressive disorder, recurrent, unspecified: Secondary | ICD-10-CM | POA: Diagnosis not present

## 2018-05-31 DIAGNOSIS — M810 Age-related osteoporosis without current pathological fracture: Secondary | ICD-10-CM | POA: Diagnosis not present

## 2018-06-01 ENCOUNTER — Encounter (INDEPENDENT_AMBULATORY_CARE_PROVIDER_SITE_OTHER): Payer: Medicare Other | Admitting: Physical Medicine and Rehabilitation

## 2018-06-06 ENCOUNTER — Ambulatory Visit (INDEPENDENT_AMBULATORY_CARE_PROVIDER_SITE_OTHER): Payer: Medicare Other | Admitting: Physical Medicine and Rehabilitation

## 2018-06-06 ENCOUNTER — Encounter: Payer: Self-pay | Admitting: Physical Medicine and Rehabilitation

## 2018-06-06 ENCOUNTER — Other Ambulatory Visit: Payer: Self-pay

## 2018-06-06 ENCOUNTER — Ambulatory Visit: Payer: Self-pay

## 2018-06-06 VITALS — BP 154/65 | HR 62

## 2018-06-06 DIAGNOSIS — G4733 Obstructive sleep apnea (adult) (pediatric): Secondary | ICD-10-CM | POA: Diagnosis not present

## 2018-06-06 DIAGNOSIS — M24661 Ankylosis, right knee: Secondary | ICD-10-CM | POA: Diagnosis not present

## 2018-06-06 DIAGNOSIS — M5416 Radiculopathy, lumbar region: Secondary | ICD-10-CM | POA: Diagnosis not present

## 2018-06-06 DIAGNOSIS — Z9989 Dependence on other enabling machines and devices: Secondary | ICD-10-CM | POA: Diagnosis not present

## 2018-06-06 DIAGNOSIS — M810 Age-related osteoporosis without current pathological fracture: Secondary | ICD-10-CM | POA: Diagnosis not present

## 2018-06-06 DIAGNOSIS — I872 Venous insufficiency (chronic) (peripheral): Secondary | ICD-10-CM | POA: Diagnosis not present

## 2018-06-06 DIAGNOSIS — F339 Major depressive disorder, recurrent, unspecified: Secondary | ICD-10-CM | POA: Diagnosis not present

## 2018-06-06 MED ORDER — METHYLPREDNISOLONE ACETATE 80 MG/ML IJ SUSP
80.0000 mg | Freq: Once | INTRAMUSCULAR | Status: AC
  Administered 2018-06-06: 80 mg

## 2018-06-06 NOTE — Progress Notes (Signed)
  Numeric Pain Rating Scale and Functional Assessment Average Pain 7   In the last MONTH (on 0-10 scale) has pain interfered with the following?  1. General activity like being  able to carry out your everyday physical activities such as walking, climbing stairs, carrying groceries, or moving a chair?  Rating(0)   +Driver, +BT, -Dye Allergies.

## 2018-06-07 NOTE — Progress Notes (Signed)
Julia Mcconnell - 83 y.o. female MRN [id number]  Date of birth: [birth year]  Office Visit Note: Visit Date: 06/06/2018 PCP: Deland Pretty, MD Referred by: Deland Pretty, MD  Subjective: Chief Complaint  Patient presents with  . Lower Back - Pain  . Left Hip - Pain  . Right Hip - Pain   HPI:  Julia Mcconnell is a 83 y.o. female who comes in today For planned repeat L4-5 interlaminar epidural steroid injection.  Prior injection was very beneficial and she was very happy with the outcome.  Unfortunately she did not discontinue her Plavix anticoagulation and she has been instructed.  She reports that she just really forgot.  She continues to have right more than left but bilateral hip and leg pain.  She has multilevel severe facet arthropathy of the lumbar spine with lateral recess narrowing bilaterally without a great deal of central stenosis.  We are going to complete bilateral L4 transforaminal injection do to the fact that she is on anticoagulation.  She has a history of multiple epidural injections at [facility] imaging in the past.  ROS Otherwise per HPI.  Assessment & Plan: Visit Diagnoses:  1. Lumbar radiculopathy     Plan: No additional findings.   Meds & Orders:  Meds ordered this encounter  Medications  . methylPREDNISolone acetate (DEPO-MEDROL) injection 80 mg    Orders Placed This Encounter  Procedures  . XR C-ARM NO REPORT  . Epidural Steroid injection    Follow-up: Return if symptoms worsen or fail to improve.   Procedures: No procedures performed  Lumbosacral Transforaminal Epidural Steroid Injection - Sub-Pedicular Approach with Fluoroscopic Guidance  Patient: Julia Mcconnell      Date of Birth: [birth year] MRN: [id number] PCP: Deland Pretty, MD      Visit Date: 06/06/2018   Universal Protocol:    Date/Time: 06/06/2018  Consent Given By: the patient  Position: PRONE  Additional Comments: Vital signs were monitored before and after the procedure. Patient  was prepped and draped in the usual sterile fashion. The correct patient, procedure, and site was verified.   Injection Procedure Details:  Procedure Site One Meds Administered:  Meds ordered this encounter  Medications  . methylPREDNISolone acetate (DEPO-MEDROL) injection 80 mg    Laterality: Bilateral  Location/Site:  L4-L5  Needle size: 22 G  Needle type: Spinal  Needle Placement: Transforaminal  Findings:    -Comments: Excellent flow of contrast along the nerve and into the epidural space.  Procedure Details: After squaring off the end-plates to get a true AP view, the C-arm was positioned so that an oblique view of the foramen as noted above was visualized. The target area is just inferior to the "nose of the scotty dog" or sub pedicular. The soft tissues overlying this structure were infiltrated with 2-3 ml. of 1% Lidocaine without Epinephrine.  The spinal needle was inserted toward the target using a "trajectory" view along the fluoroscope beam.  Under AP and lateral visualization, the needle was advanced so it did not puncture dura and was located close the 6 O'Clock position of the pedical in AP tracterory. Biplanar projections were used to confirm position. Aspiration was confirmed to be negative for CSF and/or blood. A 1-2 ml. volume of Isovue-250 was injected and flow of contrast was noted at each level. Radiographs were obtained for documentation purposes.   After attaining the desired flow of contrast documented above, a 0.5 to 1.0 ml test dose of 0.25% Marcaine was injected into each  respective transforaminal space.  The patient was observed for 90 seconds post injection.  After no sensory deficits were reported, and normal lower extremity motor function was noted,   the above injectate was administered so that equal amounts of the injectate were placed at each foramen (level) into the transforaminal epidural space.   Additional Comments:  The patient tolerated the  procedure well Dressing: 2 x 2 sterile gauze and Band-Aid    Post-procedure details: Patient was observed during the procedure. Post-procedure instructions were reviewed.  Patient left the clinic in stable condition.     Clinical History: MRI LUMBAR SPINE WITHOUT CONTRAST  TECHNIQUE: Multiplanar, multisequence MR imaging of the lumbar spine was performed. No intravenous contrast was administered.  COMPARISON:  10/01/2011  FINDINGS: Segmentation:  5 lumbar type vertebral bodies.  Alignment: Thoracolumbar curvature convex to the left. Lower lumbar curvature convex to the right. T12-L1 retrolisthesis of 3 mm. L1-2 retrolisthesis of 4 mm. L2-3 anterolisthesis of 2 mm. L4-5 and L5-S1 anterolisthesis of 8 mm.  Vertebrae: Old minor compression deformity at T12 with loss of height of 20%. This is completely healed. No other focal bone finding.  Conus medullaris and cauda equina: Conus extends to the L1-2 level. Conus and cauda equina appear normal.  Paraspinal and other soft tissues: Renal cysts.  Disc levels:  No compressive stenosis at T12-L1 or above.  L1-2: Retrolisthesis of 4 mm. Bulging of the disc. Bilateral foraminal narrowing that could cause neural compression.  L2-3: 2 mm anterolisthesis. Bulging of the disc. No compressive canal or foraminal stenosis.  L3-4: Bulging of the disc. Facet and ligamentous hypertrophy. No compressive canal or foraminal stenosis.  L4-5: Chronic facet arthropathy with 8 mm of anterolisthesis. Chronic disc degeneration with loss of disc height and pseudo disc herniation. Chronic stenosis of the lateral recesses and foramina that could be symptomatic. Findings could be associated with low back pain.  L5-S1: Chronic facet arthropathy with 8 mm of anterolisthesis. Chronic disc degeneration with loss of disc height and pseudo disc herniation. Chronic stenosis of the lateral recesses and neural foramina that could cause  neural compression on either or both sides. The findings could certainly be also be associated with low back pain.  Since the study of 2013, the findings are quite similar, possibly slightly progressive in general.  IMPRESSION: Very similar appearance to the study of 2013. Only minimal generalized progression of findings over time.  L4-5 and L5-S1 show advanced facet arthropathy allowing 8 mm of anterolisthesis at both levels. There is disc degeneration with pseudo disc herniation. The central canal is sufficiently patent at those levels but there is narrowing of the lateral recesses and foramina that would have potential to cause neural compression. The findings could certainly be associated with low back pain.  Lesser degenerative changes from the lower thoracic region through L3-4, without distinct neural compression, but with potential to contribute to chronic back pain.   Electronically Signed   By: Nelson Chimes M.D.   On: 01/15/2018 14:55     Objective:  VS:  HT:    WT:   BMI:     BP:(!) 154/65  HR:62bpm  TEMP: ( )  RESP:  Physical Exam  Ortho Exam Imaging: Xr C-arm No Report  Result Date: 06/06/2018 Please see Notes tab for imaging impression.

## 2018-06-07 NOTE — Procedures (Signed)
Lumbosacral Transforaminal Epidural Steroid Injection - Sub-Pedicular Approach with Fluoroscopic Guidance  Patient: Julia Mcconnell      Date of Birth: 1935/10/07 MRN: [id number] PCP: Deland Pretty, MD      Visit Date: 06/06/2018   Universal Protocol:    Date/Time: 06/06/2018  Consent Given By: the patient  Position: PRONE  Additional Comments: Vital signs were monitored before and after the procedure. Patient was prepped and draped in the usual sterile fashion. The correct patient, procedure, and site was verified.   Injection Procedure Details:  Procedure Site One Meds Administered:  Meds ordered this encounter  Medications  . methylPREDNISolone acetate (DEPO-MEDROL) injection 80 mg    Laterality: Bilateral  Location/Site:  L4-L5  Needle size: 22 G  Needle type: Spinal  Needle Placement: Transforaminal  Findings:    -Comments: Excellent flow of contrast along the nerve and into the epidural space.  Procedure Details: After squaring off the end-plates to get a true AP view, the C-arm was positioned so that an oblique view of the foramen as noted above was visualized. The target area is just inferior to the "nose of the scotty dog" or sub pedicular. The soft tissues overlying this structure were infiltrated with 2-3 ml. of 1% Lidocaine without Epinephrine.  The spinal needle was inserted toward the target using a "trajectory" view along the fluoroscope beam.  Under AP and lateral visualization, the needle was advanced so it did not puncture dura and was located close the 6 O'Clock position of the pedical in AP tracterory. Biplanar projections were used to confirm position. Aspiration was confirmed to be negative for CSF and/or blood. A 1-2 ml. volume of Isovue-250 was injected and flow of contrast was noted at each level. Radiographs were obtained for documentation purposes.   After attaining the desired flow of contrast documented above, a 0.5 to 1.0 ml test dose of  0.25% Marcaine was injected into each respective transforaminal space.  The patient was observed for 90 seconds post injection.  After no sensory deficits were reported, and normal lower extremity motor function was noted,   the above injectate was administered so that equal amounts of the injectate were placed at each foramen (level) into the transforaminal epidural space.   Additional Comments:  The patient tolerated the procedure well Dressing: 2 x 2 sterile gauze and Band-Aid    Post-procedure details: Patient was observed during the procedure. Post-procedure instructions were reviewed.  Patient left the clinic in stable condition.

## 2018-06-08 DIAGNOSIS — F339 Major depressive disorder, recurrent, unspecified: Secondary | ICD-10-CM | POA: Diagnosis not present

## 2018-06-08 DIAGNOSIS — Z9989 Dependence on other enabling machines and devices: Secondary | ICD-10-CM | POA: Diagnosis not present

## 2018-06-08 DIAGNOSIS — G4733 Obstructive sleep apnea (adult) (pediatric): Secondary | ICD-10-CM | POA: Diagnosis not present

## 2018-06-08 DIAGNOSIS — M810 Age-related osteoporosis without current pathological fracture: Secondary | ICD-10-CM | POA: Diagnosis not present

## 2018-06-08 DIAGNOSIS — M24661 Ankylosis, right knee: Secondary | ICD-10-CM | POA: Diagnosis not present

## 2018-06-08 DIAGNOSIS — I872 Venous insufficiency (chronic) (peripheral): Secondary | ICD-10-CM | POA: Diagnosis not present

## 2018-06-10 ENCOUNTER — Other Ambulatory Visit: Payer: Medicare Other

## 2018-06-10 ENCOUNTER — Telehealth: Payer: Self-pay | Admitting: Cardiology

## 2018-06-10 DIAGNOSIS — Z20822 Contact with and (suspected) exposure to covid-19: Secondary | ICD-10-CM

## 2018-06-10 NOTE — Telephone Encounter (Signed)
Spoke with patient regarding recent office visit, with possible exposure to Covid.   Order place to be tested 5/30 @ 10:30 [place]

## 2018-06-12 DIAGNOSIS — F339 Major depressive disorder, recurrent, unspecified: Secondary | ICD-10-CM | POA: Diagnosis not present

## 2018-06-12 DIAGNOSIS — I872 Venous insufficiency (chronic) (peripheral): Secondary | ICD-10-CM | POA: Diagnosis not present

## 2018-06-12 DIAGNOSIS — M24661 Ankylosis, right knee: Secondary | ICD-10-CM | POA: Diagnosis not present

## 2018-06-12 DIAGNOSIS — M810 Age-related osteoporosis without current pathological fracture: Secondary | ICD-10-CM | POA: Diagnosis not present

## 2018-06-12 DIAGNOSIS — Z9989 Dependence on other enabling machines and devices: Secondary | ICD-10-CM | POA: Diagnosis not present

## 2018-06-12 DIAGNOSIS — G4733 Obstructive sleep apnea (adult) (pediatric): Secondary | ICD-10-CM | POA: Diagnosis not present

## 2018-06-12 LAB — NOVEL CORONAVIRUS, NAA: SARS-CoV-2, NAA: NOT DETECTED

## 2018-06-14 DIAGNOSIS — I872 Venous insufficiency (chronic) (peripheral): Secondary | ICD-10-CM | POA: Diagnosis not present

## 2018-06-14 DIAGNOSIS — F339 Major depressive disorder, recurrent, unspecified: Secondary | ICD-10-CM | POA: Diagnosis not present

## 2018-06-14 DIAGNOSIS — G4733 Obstructive sleep apnea (adult) (pediatric): Secondary | ICD-10-CM | POA: Diagnosis not present

## 2018-06-14 DIAGNOSIS — Z9989 Dependence on other enabling machines and devices: Secondary | ICD-10-CM | POA: Diagnosis not present

## 2018-06-14 DIAGNOSIS — M24661 Ankylosis, right knee: Secondary | ICD-10-CM | POA: Diagnosis not present

## 2018-06-14 DIAGNOSIS — M810 Age-related osteoporosis without current pathological fracture: Secondary | ICD-10-CM | POA: Diagnosis not present

## 2018-06-16 DIAGNOSIS — M24661 Ankylosis, right knee: Secondary | ICD-10-CM | POA: Diagnosis not present

## 2018-06-16 DIAGNOSIS — R2689 Other abnormalities of gait and mobility: Secondary | ICD-10-CM | POA: Diagnosis not present

## 2018-06-16 DIAGNOSIS — G4733 Obstructive sleep apnea (adult) (pediatric): Secondary | ICD-10-CM | POA: Diagnosis not present

## 2018-06-16 DIAGNOSIS — R41841 Cognitive communication deficit: Secondary | ICD-10-CM | POA: Diagnosis not present

## 2018-06-16 DIAGNOSIS — Z96651 Presence of right artificial knee joint: Secondary | ICD-10-CM | POA: Diagnosis not present

## 2018-06-16 DIAGNOSIS — M6281 Muscle weakness (generalized): Secondary | ICD-10-CM | POA: Diagnosis not present

## 2018-06-16 DIAGNOSIS — Z9989 Dependence on other enabling machines and devices: Secondary | ICD-10-CM | POA: Diagnosis not present

## 2018-06-16 DIAGNOSIS — I872 Venous insufficiency (chronic) (peripheral): Secondary | ICD-10-CM | POA: Diagnosis not present

## 2018-06-16 DIAGNOSIS — M810 Age-related osteoporosis without current pathological fracture: Secondary | ICD-10-CM | POA: Diagnosis not present

## 2018-06-16 DIAGNOSIS — F339 Major depressive disorder, recurrent, unspecified: Secondary | ICD-10-CM | POA: Diagnosis not present

## 2018-06-19 DIAGNOSIS — M24661 Ankylosis, right knee: Secondary | ICD-10-CM | POA: Diagnosis not present

## 2018-06-19 DIAGNOSIS — I872 Venous insufficiency (chronic) (peripheral): Secondary | ICD-10-CM | POA: Diagnosis not present

## 2018-06-19 DIAGNOSIS — Z9989 Dependence on other enabling machines and devices: Secondary | ICD-10-CM | POA: Diagnosis not present

## 2018-06-19 DIAGNOSIS — F339 Major depressive disorder, recurrent, unspecified: Secondary | ICD-10-CM | POA: Diagnosis not present

## 2018-06-19 DIAGNOSIS — M810 Age-related osteoporosis without current pathological fracture: Secondary | ICD-10-CM | POA: Diagnosis not present

## 2018-06-19 DIAGNOSIS — G4733 Obstructive sleep apnea (adult) (pediatric): Secondary | ICD-10-CM | POA: Diagnosis not present

## 2018-06-21 DIAGNOSIS — G4733 Obstructive sleep apnea (adult) (pediatric): Secondary | ICD-10-CM | POA: Diagnosis not present

## 2018-06-21 DIAGNOSIS — Z9989 Dependence on other enabling machines and devices: Secondary | ICD-10-CM | POA: Diagnosis not present

## 2018-06-21 DIAGNOSIS — I872 Venous insufficiency (chronic) (peripheral): Secondary | ICD-10-CM | POA: Diagnosis not present

## 2018-06-21 DIAGNOSIS — M810 Age-related osteoporosis without current pathological fracture: Secondary | ICD-10-CM | POA: Diagnosis not present

## 2018-06-21 DIAGNOSIS — F339 Major depressive disorder, recurrent, unspecified: Secondary | ICD-10-CM | POA: Diagnosis not present

## 2018-06-21 DIAGNOSIS — M24661 Ankylosis, right knee: Secondary | ICD-10-CM | POA: Diagnosis not present

## 2018-06-26 DIAGNOSIS — H04123 Dry eye syndrome of bilateral lacrimal glands: Secondary | ICD-10-CM | POA: Diagnosis not present

## 2018-06-26 DIAGNOSIS — H18423 Band keratopathy, bilateral: Secondary | ICD-10-CM | POA: Diagnosis not present

## 2018-06-26 DIAGNOSIS — H16223 Keratoconjunctivitis sicca, not specified as Sjogren's, bilateral: Secondary | ICD-10-CM | POA: Diagnosis not present

## 2018-06-26 DIAGNOSIS — H40013 Open angle with borderline findings, low risk, bilateral: Secondary | ICD-10-CM | POA: Diagnosis not present

## 2018-06-27 DIAGNOSIS — F339 Major depressive disorder, recurrent, unspecified: Secondary | ICD-10-CM | POA: Diagnosis not present

## 2018-06-27 DIAGNOSIS — M810 Age-related osteoporosis without current pathological fracture: Secondary | ICD-10-CM | POA: Diagnosis not present

## 2018-06-27 DIAGNOSIS — Z9989 Dependence on other enabling machines and devices: Secondary | ICD-10-CM | POA: Diagnosis not present

## 2018-06-27 DIAGNOSIS — I872 Venous insufficiency (chronic) (peripheral): Secondary | ICD-10-CM | POA: Diagnosis not present

## 2018-06-27 DIAGNOSIS — M24661 Ankylosis, right knee: Secondary | ICD-10-CM | POA: Diagnosis not present

## 2018-06-27 DIAGNOSIS — G4733 Obstructive sleep apnea (adult) (pediatric): Secondary | ICD-10-CM | POA: Diagnosis not present

## 2018-06-28 DIAGNOSIS — Z1231 Encounter for screening mammogram for malignant neoplasm of breast: Secondary | ICD-10-CM | POA: Diagnosis not present

## 2018-06-28 DIAGNOSIS — Z01419 Encounter for gynecological examination (general) (routine) without abnormal findings: Secondary | ICD-10-CM | POA: Diagnosis not present

## 2018-06-28 DIAGNOSIS — N760 Acute vaginitis: Secondary | ICD-10-CM | POA: Diagnosis not present

## 2018-06-29 DIAGNOSIS — Z9989 Dependence on other enabling machines and devices: Secondary | ICD-10-CM | POA: Diagnosis not present

## 2018-06-29 DIAGNOSIS — M810 Age-related osteoporosis without current pathological fracture: Secondary | ICD-10-CM | POA: Diagnosis not present

## 2018-06-29 DIAGNOSIS — M24661 Ankylosis, right knee: Secondary | ICD-10-CM | POA: Diagnosis not present

## 2018-06-29 DIAGNOSIS — I872 Venous insufficiency (chronic) (peripheral): Secondary | ICD-10-CM | POA: Diagnosis not present

## 2018-06-29 DIAGNOSIS — G4733 Obstructive sleep apnea (adult) (pediatric): Secondary | ICD-10-CM | POA: Diagnosis not present

## 2018-06-29 DIAGNOSIS — F339 Major depressive disorder, recurrent, unspecified: Secondary | ICD-10-CM | POA: Diagnosis not present

## 2018-07-03 ENCOUNTER — Telehealth: Payer: Self-pay | Admitting: Orthopedic Surgery

## 2018-07-03 DIAGNOSIS — G4733 Obstructive sleep apnea (adult) (pediatric): Secondary | ICD-10-CM | POA: Diagnosis not present

## 2018-07-03 DIAGNOSIS — M24661 Ankylosis, right knee: Secondary | ICD-10-CM | POA: Diagnosis not present

## 2018-07-03 DIAGNOSIS — Z9989 Dependence on other enabling machines and devices: Secondary | ICD-10-CM | POA: Diagnosis not present

## 2018-07-03 DIAGNOSIS — M810 Age-related osteoporosis without current pathological fracture: Secondary | ICD-10-CM | POA: Diagnosis not present

## 2018-07-03 DIAGNOSIS — I872 Venous insufficiency (chronic) (peripheral): Secondary | ICD-10-CM | POA: Diagnosis not present

## 2018-07-03 DIAGNOSIS — F339 Major depressive disorder, recurrent, unspecified: Secondary | ICD-10-CM | POA: Diagnosis not present

## 2018-07-03 NOTE — Telephone Encounter (Signed)
Betsy @ [employer] [facility] request verbal orders for PT change to 2 time a week for 1 week. Betsy's call back # [phone]

## 2018-07-04 NOTE — Telephone Encounter (Signed)
Julia Mcconnell was called and given verbal okay for PT orders.

## 2018-07-05 DIAGNOSIS — Z9989 Dependence on other enabling machines and devices: Secondary | ICD-10-CM | POA: Diagnosis not present

## 2018-07-05 DIAGNOSIS — M24661 Ankylosis, right knee: Secondary | ICD-10-CM | POA: Diagnosis not present

## 2018-07-05 DIAGNOSIS — M810 Age-related osteoporosis without current pathological fracture: Secondary | ICD-10-CM | POA: Diagnosis not present

## 2018-07-05 DIAGNOSIS — F339 Major depressive disorder, recurrent, unspecified: Secondary | ICD-10-CM | POA: Diagnosis not present

## 2018-07-05 DIAGNOSIS — G4733 Obstructive sleep apnea (adult) (pediatric): Secondary | ICD-10-CM | POA: Diagnosis not present

## 2018-07-05 DIAGNOSIS — I872 Venous insufficiency (chronic) (peripheral): Secondary | ICD-10-CM | POA: Diagnosis not present

## 2018-07-31 ENCOUNTER — Ambulatory Visit (INDEPENDENT_AMBULATORY_CARE_PROVIDER_SITE_OTHER): Payer: Medicare Other | Admitting: Orthopedic Surgery

## 2018-07-31 ENCOUNTER — Encounter: Payer: Self-pay | Admitting: Orthopedic Surgery

## 2018-07-31 VITALS — Ht <= 58 in | Wt 129.0 lb

## 2018-07-31 DIAGNOSIS — Z96651 Presence of right artificial knee joint: Secondary | ICD-10-CM

## 2018-08-03 DIAGNOSIS — E785 Hyperlipidemia, unspecified: Secondary | ICD-10-CM | POA: Diagnosis not present

## 2018-08-03 DIAGNOSIS — I10 Essential (primary) hypertension: Secondary | ICD-10-CM | POA: Diagnosis not present

## 2018-08-05 ENCOUNTER — Encounter: Payer: Self-pay | Admitting: Orthopedic Surgery
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
## 2018-08-10 DIAGNOSIS — I6529 Occlusion and stenosis of unspecified carotid artery: Secondary | ICD-10-CM | POA: Diagnosis not present

## 2018-08-10 DIAGNOSIS — D72819 Decreased white blood cell count, unspecified: Secondary | ICD-10-CM | POA: Diagnosis not present

## 2018-08-10 DIAGNOSIS — Z0000 Encounter for general adult medical examination without abnormal findings: Secondary | ICD-10-CM | POA: Diagnosis not present

## 2018-08-10 DIAGNOSIS — R600 Localized edema: Secondary | ICD-10-CM | POA: Diagnosis not present

## 2018-08-10 DIAGNOSIS — Z8673 Personal history of transient ischemic attack (TIA), and cerebral infarction without residual deficits: Secondary | ICD-10-CM | POA: Diagnosis not present

## 2018-08-10 DIAGNOSIS — I10 Essential (primary) hypertension: Secondary | ICD-10-CM | POA: Diagnosis not present

## 2018-08-10 DIAGNOSIS — M8580 Other specified disorders of bone density and structure, unspecified site: Secondary | ICD-10-CM | POA: Diagnosis not present

## 2018-08-10 DIAGNOSIS — G2581 Restless legs syndrome: Secondary | ICD-10-CM | POA: Diagnosis not present

## 2018-08-10 DIAGNOSIS — G4700 Insomnia, unspecified: Secondary | ICD-10-CM | POA: Diagnosis not present

## 2018-08-10 DIAGNOSIS — G4733 Obstructive sleep apnea (adult) (pediatric): Secondary | ICD-10-CM | POA: Diagnosis not present

## 2018-08-10 DIAGNOSIS — F329 Major depressive disorder, single episode, unspecified: Secondary | ICD-10-CM | POA: Diagnosis not present

## 2018-08-10 DIAGNOSIS — M1990 Unspecified osteoarthritis, unspecified site: Secondary | ICD-10-CM | POA: Diagnosis not present

## 2018-08-24 DIAGNOSIS — M810 Age-related osteoporosis without current pathological fracture: Secondary | ICD-10-CM | POA: Diagnosis not present

## 2018-09-06 ENCOUNTER — Ambulatory Visit: Payer: Medicare Other | Admitting: Cardiovascular Disease

## 2018-09-22 DIAGNOSIS — Z23 Encounter for immunization: Secondary | ICD-10-CM | POA: Diagnosis not present

## 2018-09-29 DIAGNOSIS — D225 Melanocytic nevi of trunk: Secondary | ICD-10-CM | POA: Diagnosis not present

## 2018-09-29 DIAGNOSIS — L986 Other infiltrative disorders of the skin and subcutaneous tissue: Secondary | ICD-10-CM | POA: Diagnosis not present

## 2018-09-29 DIAGNOSIS — Z85820 Personal history of malignant melanoma of skin: Secondary | ICD-10-CM | POA: Diagnosis not present

## 2018-09-29 DIAGNOSIS — Z08 Encounter for follow-up examination after completed treatment for malignant neoplasm: Secondary | ICD-10-CM | POA: Diagnosis not present

## 2018-09-29 DIAGNOSIS — D485 Neoplasm of uncertain behavior of skin: Secondary | ICD-10-CM | POA: Diagnosis not present

## 2018-09-29 DIAGNOSIS — Z1283 Encounter for screening for malignant neoplasm of skin: Secondary | ICD-10-CM | POA: Diagnosis not present

## 2018-10-03 ENCOUNTER — Ambulatory Visit: Payer: Medicare Other | Admitting: Cardiovascular Disease

## 2018-10-03 DIAGNOSIS — R4020 Unspecified coma: Secondary | ICD-10-CM | POA: Diagnosis not present

## 2018-10-12 DIAGNOSIS — 419620001 Death: Secondary | SNOMED CT | POA: Diagnosis not present

## 2018-10-12 DEATH — deceased

## 2019-06-05 IMAGING — MR MR LUMBAR SPINE W/O CM
4 of 5 series · 19 of 48 positions shown · non-contrast
Comparison: 10/01/2011

CLINICAL DATA: Bilateral leg pain and diffuse low back pain which
is chronic. Weakness and numbness in both legs.

EXAM:
MRI LUMBAR SPINE WITHOUT CONTRAST
TECHNIQUE: Multiplanar, multisequence MR imaging of the lumbar spine was
performed. No intravenous contrast was administered.

[Series 5: T2 · sagittal · 4.0mm · 0.73mm/px · 7 of 18 slices shown (1 of 2)]
[im 1/18]
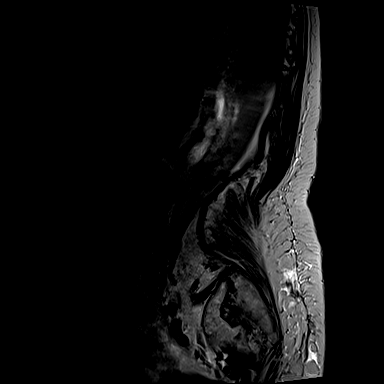
[im 3/18]
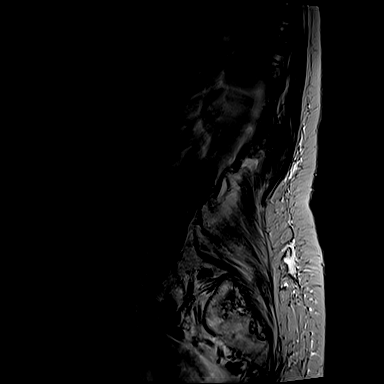
[im 6/18]
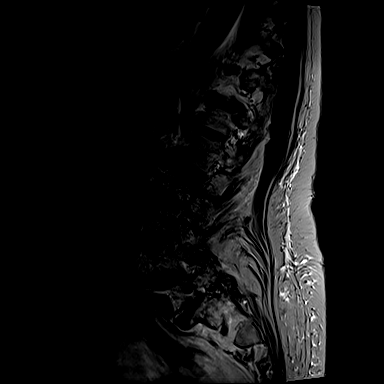
[im 9/18]
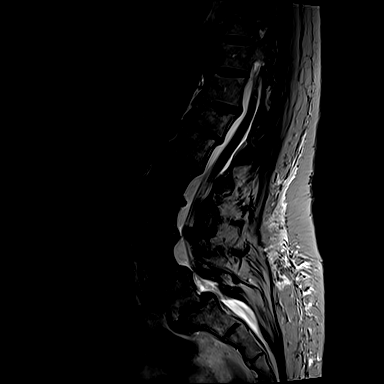
[im 12/18]
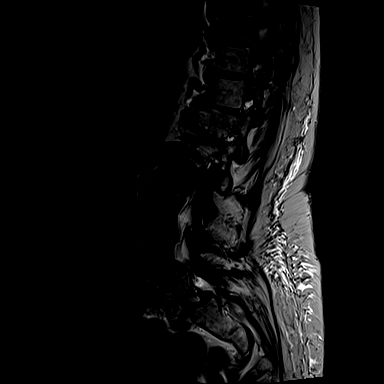
[im 15/18]
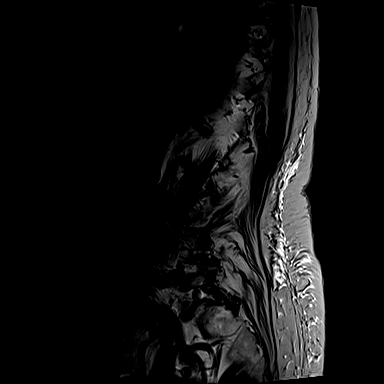
[im 18/18]
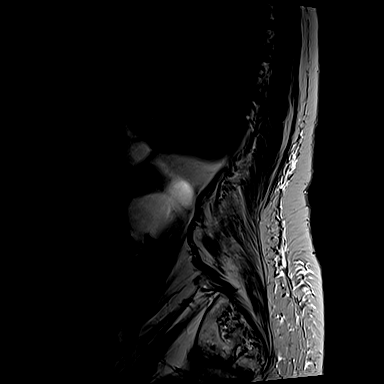

[Series 7: T1 · sagittal · 4.0mm · 0.88mm/px · 3 of 18 slices shown (1 of 2)]
[im 4/18]
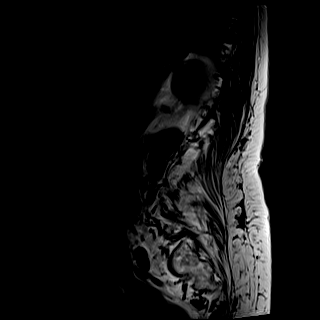
[im 11/18]
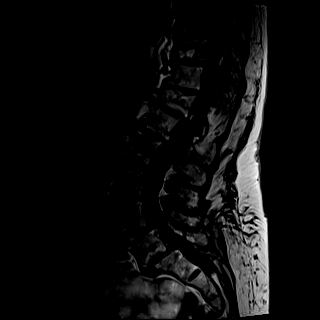
[im 18/18]
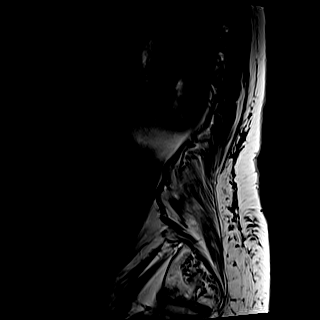

[Series 10: T1 · axial · 4.0mm · 0.28mm/px · z∈[-107,+53]mm · 3 of 40 slices shown (2 of 2)]
[im 7/40]
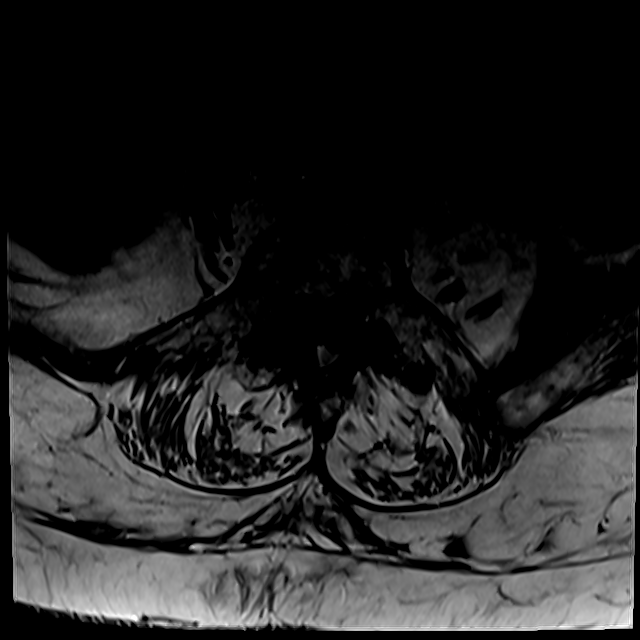
[im 22/40]
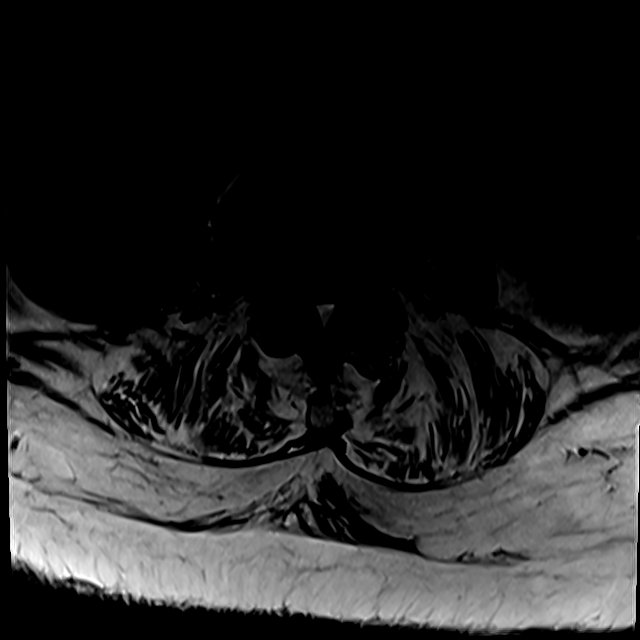
[im 34/40]
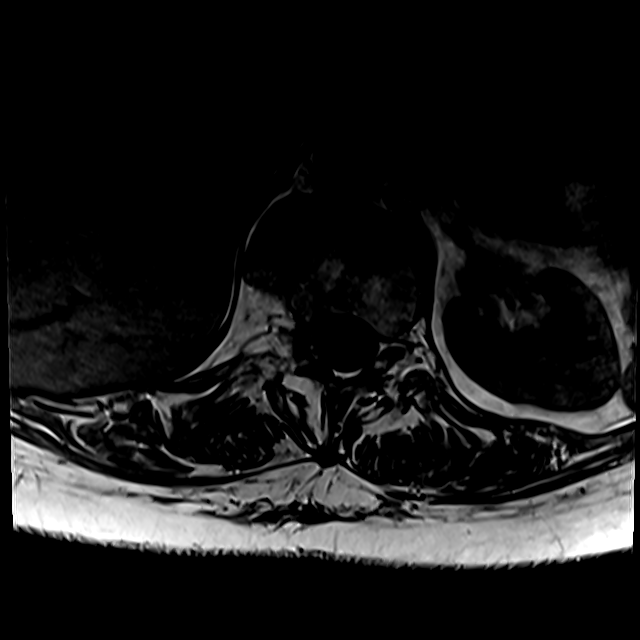

[Series 13: T2 · axial · 4.0mm · 0.28mm/px · z∈[-136,+53]mm · 6 of 40 slices shown (2 of 2)]
[im 1/40]
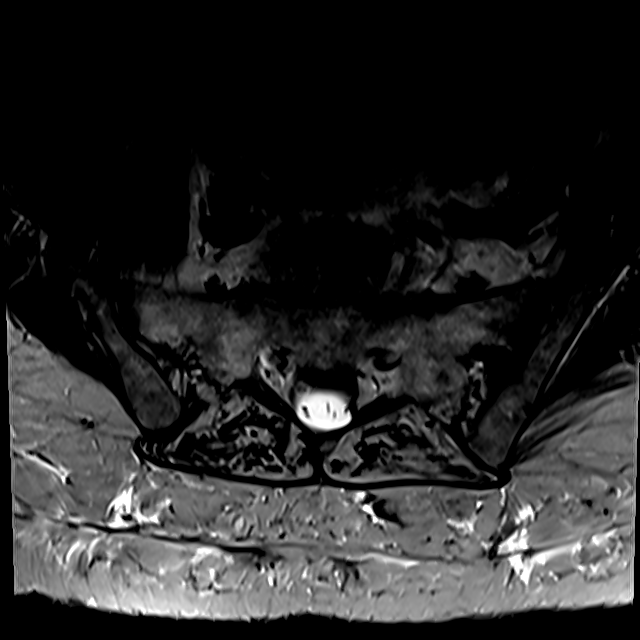
[im 7/40]
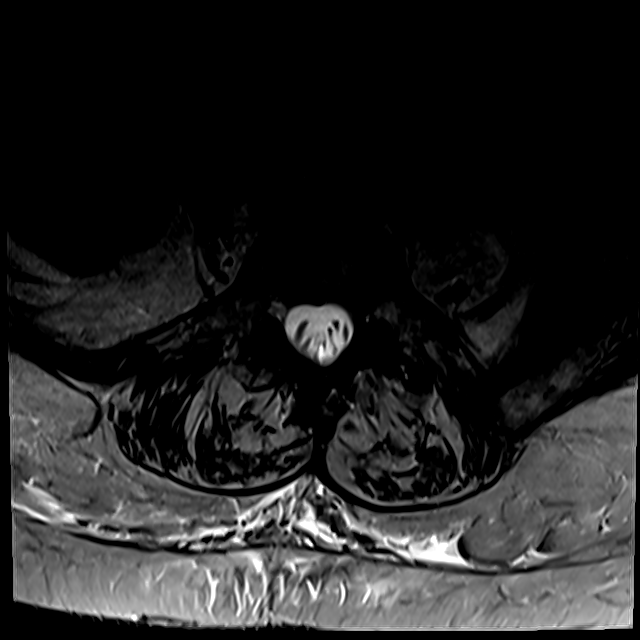
[im 13/40]
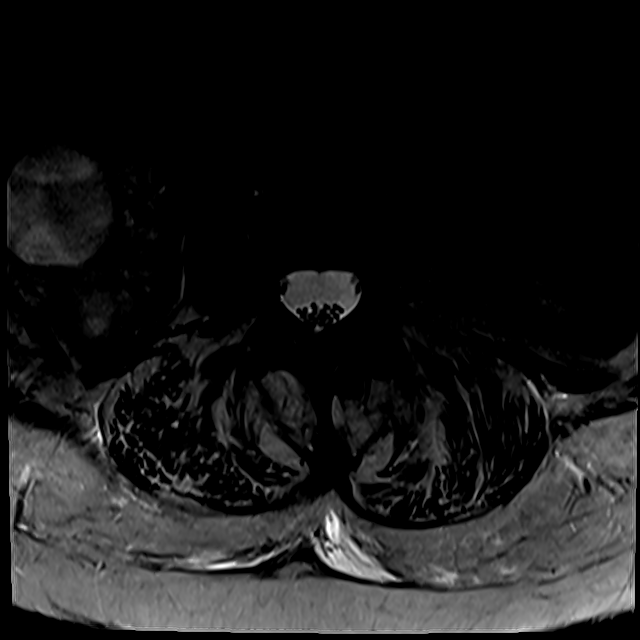
[im 19/40]
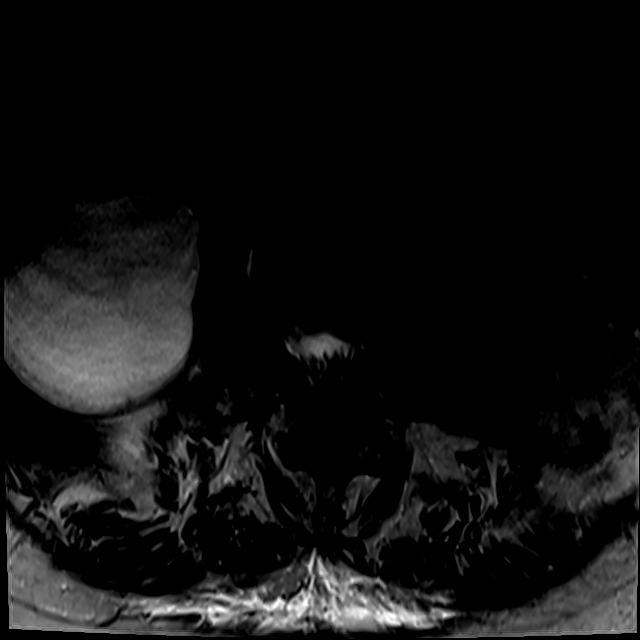
[im 22/40]
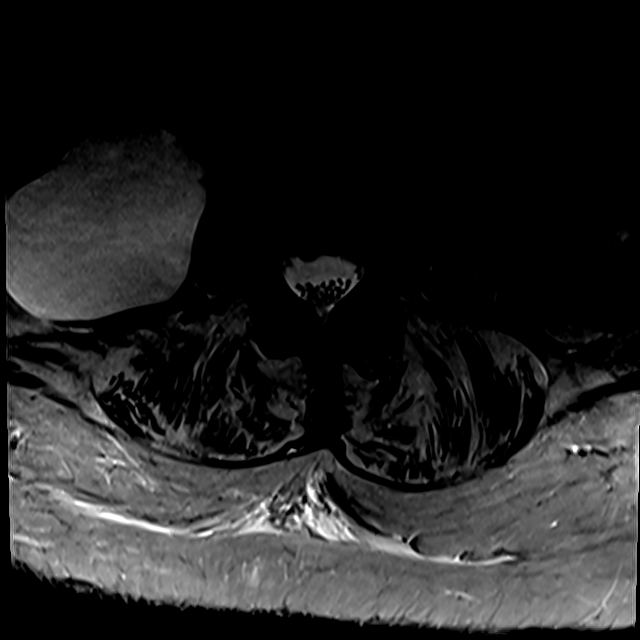
[im 34/40]
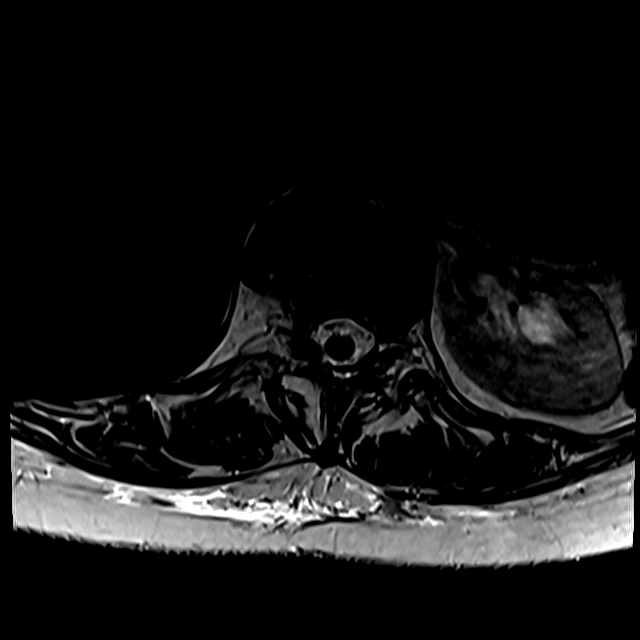

[19 of 48 positions shown; findings below may reference images not displayed]

FINDINGS: Segmentation:  5 lumbar type vertebral bodies.

Alignment: Thoracolumbar curvature convex to the left. Lower lumbar
curvature convex to the right. T12-L1 retrolisthesis of 3 mm. L1-2
retrolisthesis of 4 mm. L2-3 anterolisthesis of 2 mm. L4-5 and L5-S1
anterolisthesis of 8 mm.

Vertebrae: Old minor compression deformity at T12 with loss of
height of 20%. This is completely healed. No other focal bone
finding.

Conus medullaris and cauda equina: Conus extends to the L1-2 level.
Conus and cauda equina appear normal.

Paraspinal and other soft tissues: Renal cysts.

Disc levels:

No compressive stenosis at T12-L1 or above.

L1-2: Retrolisthesis of 4 mm. Bulging of the disc. Bilateral
foraminal narrowing that could cause neural compression.

L2-3: 2 mm anterolisthesis. Bulging of the disc. No compressive
canal or foraminal stenosis.

L3-4: Bulging of the disc. Facet and ligamentous hypertrophy. No
compressive canal or foraminal stenosis.

L4-5: Chronic facet arthropathy with 8 mm of anterolisthesis.
Chronic disc degeneration with loss of disc height and pseudo disc
herniation. Chronic stenosis of the lateral recesses and foramina
that could be symptomatic. Findings could be associated with low
back pain.

L5-S1: Chronic facet arthropathy with 8 mm of anterolisthesis.
Chronic disc degeneration with loss of disc height and pseudo disc
herniation. Chronic stenosis of the lateral recesses and neural
foramina that could cause neural compression on either or both
sides. The findings could certainly be also be associated with low
back pain.

Since the study of 6925, the findings are quite similar, possibly
slightly progressive in general.
IMPRESSION: Very similar appearance to the study of 6925. Only minimal
generalized progression of findings over time.

L4-5 and L5-S1 show advanced facet arthropathy allowing 8 mm of
anterolisthesis at both levels. There is disc degeneration with
pseudo disc herniation. The central canal is sufficiently patent at
those levels but there is narrowing of the lateral recesses and
foramina that would have potential to cause neural compression. The
findings could certainly be associated with low back pain.

Lesser degenerative changes from the lower thoracic region through
L3-4, without distinct neural compression, but with potential to
contribute to chronic back pain.
